# Patient Record
Sex: Male | Born: 1960 | Race: White | Hispanic: No | Marital: Married | Smoking: Former smoker
Health system: Southern US, Community
[De-identification: ages and names within clinical notes are randomized; demographics above are authoritative.]

## PROBLEM LIST (undated history)

## (undated) DIAGNOSIS — M25519 Pain in unspecified shoulder: Secondary | ICD-10-CM

## (undated) DIAGNOSIS — K4090 Unilateral inguinal hernia, without obstruction or gangrene, not specified as recurrent: Secondary | ICD-10-CM

## (undated) DIAGNOSIS — M549 Dorsalgia, unspecified: Secondary | ICD-10-CM

## (undated) DIAGNOSIS — D0339 Melanoma in situ of other parts of face: Secondary | ICD-10-CM

## (undated) DIAGNOSIS — F419 Anxiety disorder, unspecified: Secondary | ICD-10-CM

## (undated) DIAGNOSIS — F32A Depression, unspecified: Secondary | ICD-10-CM

## (undated) DIAGNOSIS — R609 Edema, unspecified: Secondary | ICD-10-CM

## (undated) DIAGNOSIS — M1990 Unspecified osteoarthritis, unspecified site: Secondary | ICD-10-CM

## (undated) DIAGNOSIS — T7840XA Allergy, unspecified, initial encounter: Secondary | ICD-10-CM

## (undated) DIAGNOSIS — M75100 Unspecified rotator cuff tear or rupture of unspecified shoulder, not specified as traumatic: Secondary | ICD-10-CM

## (undated) DIAGNOSIS — E785 Hyperlipidemia, unspecified: Secondary | ICD-10-CM

## (undated) DIAGNOSIS — J45909 Unspecified asthma, uncomplicated: Secondary | ICD-10-CM

## (undated) DIAGNOSIS — M25559 Pain in unspecified hip: Secondary | ICD-10-CM

## (undated) DIAGNOSIS — K219 Gastro-esophageal reflux disease without esophagitis: Secondary | ICD-10-CM

## (undated) DIAGNOSIS — C801 Malignant (primary) neoplasm, unspecified: Secondary | ICD-10-CM

## (undated) DIAGNOSIS — M67431 Ganglion, right wrist: Secondary | ICD-10-CM

## (undated) HISTORY — PX: HERNIA REPAIR: SHX51

## (undated) HISTORY — DX: Pain in unspecified shoulder: M25.519

## (undated) HISTORY — DX: Anxiety disorder, unspecified: F41.9

## (undated) HISTORY — DX: Dorsalgia, unspecified: M54.9

## (undated) HISTORY — DX: Hyperlipidemia, unspecified: E78.5

## (undated) HISTORY — DX: Unspecified osteoarthritis, unspecified site: M19.90

## (undated) HISTORY — DX: Unilateral inguinal hernia, without obstruction or gangrene, not specified as recurrent: K40.90

## (undated) HISTORY — DX: Allergy, unspecified, initial encounter: T78.40XA

## (undated) HISTORY — PX: TONSILLECTOMY AND ADENOIDECTOMY: SUR1326

## (undated) HISTORY — DX: Melanoma in situ of other parts of face: D03.39

## (undated) HISTORY — DX: Ganglion, right wrist: M67.431

## (undated) HISTORY — DX: Gastro-esophageal reflux disease without esophagitis: K21.9

## (undated) HISTORY — DX: Edema, unspecified: R60.9

## (undated) HISTORY — DX: Unspecified asthma, uncomplicated: J45.909

## (undated) HISTORY — DX: Pain in unspecified hip: M25.559

## (undated) HISTORY — DX: Malignant (primary) neoplasm, unspecified: C80.1

## (undated) HISTORY — DX: Unspecified rotator cuff tear or rupture of unspecified shoulder, not specified as traumatic: M75.100

## (undated) HISTORY — DX: Depression, unspecified: F32.A

## (undated) HISTORY — PX: POLYPECTOMY: SHX149

## (undated) HISTORY — PX: OTHER SURGICAL HISTORY: SHX169

---

## 1990-01-21 HISTORY — PX: ANKLE SURGERY: SHX546

## 2004-11-23 ENCOUNTER — Ambulatory Visit: Payer: Self-pay | Admitting: Family Medicine

## 2006-01-27 ENCOUNTER — Ambulatory Visit: Payer: Self-pay | Admitting: Family Medicine

## 2006-01-27 LAB — CONVERTED CEMR LAB
ALT: 39 units/L (ref 0–40)
AST: 27 units/L (ref 0–37)
Albumin: 4.3 g/dL (ref 3.5–5.2)
Alkaline Phosphatase: 73 units/L (ref 39–117)
BUN: 21 mg/dL (ref 6–23)
Basophils Absolute: 0.1 10*3/uL (ref 0.0–0.1)
Basophils Relative: 0.9 % (ref 0.0–1.0)
CO2: 30 meq/L (ref 19–32)
HCT: 43.6 % (ref 39.0–52.0)
Hemoglobin: 14.9 g/dL (ref 13.0–17.0)
LDL DIRECT: 170 mg/dL
Monocytes Relative: 8.9 % (ref 3.0–11.0)
Neutrophils Relative %: 44.1 % (ref 43.0–77.0)
Potassium: 4.3 meq/L (ref 3.5–5.1)
RDW: 12.4 % (ref 11.5–14.6)
Total Bilirubin: 0.6 mg/dL (ref 0.3–1.2)
Total Protein: 7.3 g/dL (ref 6.0–8.3)
WBC: 7.1 10*3/uL (ref 4.5–10.5)

## 2006-02-03 ENCOUNTER — Ambulatory Visit: Payer: Self-pay | Admitting: Family Medicine

## 2006-02-24 ENCOUNTER — Ambulatory Visit: Payer: Self-pay | Admitting: Gastroenterology

## 2006-02-25 ENCOUNTER — Ambulatory Visit: Payer: Self-pay | Admitting: Gastroenterology

## 2006-04-03 ENCOUNTER — Ambulatory Visit: Payer: Self-pay | Admitting: Family Medicine

## 2006-04-03 LAB — CONVERTED CEMR LAB
Albumin: 3.7 g/dL (ref 3.5–5.2)
Basophils Absolute: 0.1 10*3/uL (ref 0.0–0.1)
Cholesterol: 171 mg/dL (ref 0–200)
Eosinophils Absolute: 0.3 10*3/uL (ref 0.0–0.6)
GFR calc Af Amer: 104 mL/min
GFR calc non Af Amer: 86 mL/min
HCT: 39.9 % (ref 39.0–52.0)
HDL: 43 mg/dL (ref >39.0)
Hemoglobin: 14.1 g/dL (ref 13.0–17.0)
MCHC: 35.3 g/dL (ref 30.0–36.0)
MCV: 87.2 fL (ref 78.0–100.0)
Monocytes Absolute: 0.6 10*3/uL (ref 0.2–0.7)
Neutrophils Relative %: 50.3 % (ref 43.0–77.0)
Potassium: 4.7 meq/L (ref 3.5–5.1)
RBC: 4.58 M/uL (ref 4.22–5.81)
Sodium: 140 meq/L (ref 135–145)
TSH: 1.39 microintl units/mL (ref 0.35–5.50)
Total CHOL/HDL Ratio: 4

## 2006-04-25 ENCOUNTER — Ambulatory Visit: Payer: Self-pay | Admitting: Gastroenterology

## 2006-04-25 HISTORY — PX: ESOPHAGOGASTRODUODENOSCOPY: SHX1529

## 2006-09-12 DIAGNOSIS — J309 Allergic rhinitis, unspecified: Secondary | ICD-10-CM

## 2006-09-12 DIAGNOSIS — K219 Gastro-esophageal reflux disease without esophagitis: Secondary | ICD-10-CM

## 2007-01-29 DIAGNOSIS — J069 Acute upper respiratory infection, unspecified: Secondary | ICD-10-CM | POA: Insufficient documentation

## 2007-02-02 ENCOUNTER — Ambulatory Visit: Payer: Self-pay | Admitting: Family Medicine

## 2007-02-09 ENCOUNTER — Telehealth: Payer: Self-pay | Admitting: Family Medicine

## 2007-02-17 ENCOUNTER — Ambulatory Visit: Payer: Self-pay | Admitting: Family Medicine

## 2007-02-17 LAB — CONVERTED CEMR LAB
ALT: 28 units/L (ref 0–53)
Bilirubin, Direct: 0.2 mg/dL (ref 0.0–0.3)
Blood in Urine, dipstick: NEGATIVE
Calcium: 9.6 mg/dL (ref 8.4–10.5)
Eosinophils Absolute: 0.2 10*3/uL (ref 0.0–0.6)
Eosinophils Relative: 3.4 % (ref 0.0–5.0)
GFR calc Af Amer: 103 mL/min
GFR calc non Af Amer: 86 mL/min
Glucose, Bld: 88 mg/dL (ref 70–99)
Glucose, Urine, Semiquant: NEGATIVE
HDL: 31.2 mg/dL — ABNORMAL LOW (ref >39.0)
Ketones, urine, test strip: NEGATIVE
Lymphocytes Relative: 34.4 % (ref 12.0–46.0)
MCV: 88.4 fL (ref 78.0–100.0)
Monocytes Absolute: 0.7 10*3/uL (ref 0.2–0.7)
Neutro Abs: 3.6 10*3/uL (ref 1.4–7.7)
Platelets: 334 10*3/uL (ref 150–400)
Potassium: 4.8 meq/L (ref 3.5–5.1)
Sodium: 141 meq/L (ref 135–145)
Specific Gravity, Urine: >=1.03
TSH: 0.73 microintl units/mL (ref 0.35–5.50)
Triglycerides: 57 mg/dL (ref 0–149)
WBC Urine, dipstick: NEGATIVE
WBC: 6.9 10*3/uL (ref 4.5–10.5)
pH: 5.5

## 2007-02-27 ENCOUNTER — Ambulatory Visit: Payer: Self-pay | Admitting: Family Medicine

## 2007-02-27 DIAGNOSIS — E785 Hyperlipidemia, unspecified: Secondary | ICD-10-CM

## 2007-07-23 ENCOUNTER — Encounter: Admission: RE | Admit: 2007-07-23 | Discharge: 2007-07-23 | Payer: Self-pay | Admitting: Orthopedic Surgery

## 2007-08-17 ENCOUNTER — Telehealth: Payer: Self-pay | Admitting: Internal Medicine

## 2008-03-10 ENCOUNTER — Ambulatory Visit: Payer: Self-pay | Admitting: Family Medicine

## 2008-03-10 LAB — CONVERTED CEMR LAB
Albumin: 4.4 g/dL (ref 3.5–5.2)
Alkaline Phosphatase: 70 units/L (ref 39–117)
BUN: 17 mg/dL (ref 6–23)
Calcium: 9.4 mg/dL (ref 8.4–10.5)
Cholesterol: 202 mg/dL (ref 0–200)
Eosinophils Absolute: 0.2 10*3/uL (ref 0.0–0.7)
Eosinophils Relative: 2.8 % (ref 0.0–5.0)
GFR calc Af Amer: 103 mL/min
GFR calc non Af Amer: 85 mL/min
Glucose, Bld: 84 mg/dL (ref 70–99)
Glucose, Urine, Semiquant: NEGATIVE
HCT: 42.8 % (ref 39.0–52.0)
Hemoglobin: 14.9 g/dL (ref 13.0–17.0)
Ketones, urine, test strip: NEGATIVE
MCV: 89.1 fL (ref 78.0–100.0)
Monocytes Absolute: 0.7 10*3/uL (ref 0.1–1.0)
Neutro Abs: 3.7 10*3/uL (ref 1.4–7.7)
Platelets: 312 10*3/uL (ref 150–400)
Potassium: 4.3 meq/L (ref 3.5–5.1)
RDW: 12.2 % (ref 11.5–14.6)
Sodium: 140 meq/L (ref 135–145)
Specific Gravity, Urine: 1.025
TSH: 0.76 microintl units/mL (ref 0.35–5.50)
Total Protein: 7.2 g/dL (ref 6.0–8.3)
Triglycerides: 63 mg/dL (ref 0–149)

## 2008-03-15 ENCOUNTER — Ambulatory Visit: Payer: Self-pay | Admitting: Family Medicine

## 2008-07-28 ENCOUNTER — Ambulatory Visit: Payer: Self-pay | Admitting: Family Medicine

## 2008-08-22 ENCOUNTER — Telehealth: Payer: Self-pay | Admitting: Family Medicine

## 2008-08-24 ENCOUNTER — Telehealth: Payer: Self-pay | Admitting: Family Medicine

## 2009-03-17 ENCOUNTER — Ambulatory Visit: Payer: Self-pay | Admitting: Family Medicine

## 2009-03-17 LAB — CONVERTED CEMR LAB
ALT: 32 units/L (ref 0–53)
AST: 20 units/L (ref 0–37)
Albumin: 4.5 g/dL (ref 3.5–5.2)
BUN: 13 mg/dL (ref 6–23)
Basophils Absolute: 0 10*3/uL (ref 0.0–0.1)
Bilirubin Urine: NEGATIVE
CO2: 28 meq/L (ref 19–32)
Chloride: 109 meq/L (ref 96–112)
Cholesterol: 182 mg/dL (ref 0–200)
GFR calc non Af Amer: 95.39 mL/min (ref >60)
Glucose, Bld: 92 mg/dL (ref 70–99)
Glucose, Urine, Semiquant: NEGATIVE
HCT: 40.4 % (ref 39.0–52.0)
Hemoglobin: 13.7 g/dL (ref 13.0–17.0)
Lymphs Abs: 2.4 10*3/uL (ref 0.7–4.0)
MCHC: 34 g/dL (ref 30.0–36.0)
MCV: 91.3 fL (ref 78.0–100.0)
Monocytes Absolute: 0.5 10*3/uL (ref 0.1–1.0)
Monocytes Relative: 7.1 % (ref 3.0–12.0)
Neutro Abs: 3.3 10*3/uL (ref 1.4–7.7)
Platelets: 297 10*3/uL (ref 150.0–400.0)
Potassium: 4 meq/L (ref 3.5–5.1)
Protein, U semiquant: NEGATIVE
RDW: 12.3 % (ref 11.5–14.6)
Sodium: 141 meq/L (ref 135–145)
Specific Gravity, Urine: <1.005
TSH: 1.33 microintl units/mL (ref 0.35–5.50)
Total Bilirubin: 0.5 mg/dL (ref 0.3–1.2)
WBC Urine, dipstick: NEGATIVE
pH: 5.5

## 2009-04-28 ENCOUNTER — Ambulatory Visit: Payer: Self-pay | Admitting: Family Medicine

## 2009-11-03 ENCOUNTER — Ambulatory Visit: Payer: Self-pay | Admitting: Family Medicine

## 2009-12-11 ENCOUNTER — Ambulatory Visit: Payer: Self-pay | Admitting: Family Medicine

## 2009-12-19 ENCOUNTER — Telehealth: Payer: Self-pay | Admitting: Family Medicine

## 2010-02-20 NOTE — Progress Notes (Signed)
Summary: returned call  Phone Note Call from Patient   Summary of Call: patient is returning our call about an inhaler?  his number is [phone].  his pharmacy is [facility] on [address]. Initial call taken by: Kern Reap CMA Duncan Dull),  December 19, 2009 4:36 PM  Follow-up for Phone Call        Damaris dates that he is getting over his cold, but he feels like he is wheezing just a little bit.  In the past.  He said some albuterol, which helps him.  I called in the albuterol two puffs 3 to 4 times a day.  If after one week.  He is not any better then he needs to come to the office and he understands that Follow-up by: Roderick Pee MD,  December 19, 2009 5:39 PM    New/Updated Medications: PROVENTIL HFA 108 (90 BASE) MCG/ACT AERS (ALBUTEROL SULFATE) 2 ps qid as needed Prescriptions: PROVENTIL HFA 108 (90 BASE) MCG/ACT AERS (ALBUTEROL SULFATE) 2 ps qid as needed  #1 x 1   Entered and Authorized by:   Roderick Pee MD   Signed by:   Roderick Pee MD on 12/19/2009   Method used:   Electronically to        [facility]  [phone]* (retail)       [address]       [place], [place]  [address]       Ph: [phone] or [phone]       Fax: [phone]   RxID:   [id number]

## 2010-02-20 NOTE — Assessment & Plan Note (Signed)
Summary: COUGH, CONGESTION // RS   Vital Signs:  Patient profile:   50 year old male Weight:      213 pounds Temp:     98.8 degrees F oral BP sitting:   110 / 80  (left arm) Cuff size:   regular  Vitals Entered By: Kern Reap CMA Duncan Dull) (December 11, 2009 3:50 PM) CC: sinus congestion, cough   CC:  sinus congestion and cough.  History of Present Illness: Jerald is a 50 year old male, who comes in with a 4-day history of a congestion, sore throat, and cough.  Review of systems negative  Allergies: No Known Drug Allergies  Past History:  Past medical, surgical, family and social histories (including risk factors) reviewed for relevance to current acute and chronic problems.  Past Medical History: Reviewed history from 02/27/2007 and no changes required. Allergic rhinitis Ganglion right wrist GERD hyperlipidemia  Past Surgical History: Reviewed history from 09/12/2006 and no changes required. T&A Left knee scope EGD-04/25/2006 Colonoscopy-02/25/2006  Family History: Reviewed history from 02/27/2007 and no changes required. Father: Pulmonary Embolus Mother: .colon polyps Siblings: x 1 ok  Social History: Reviewed history from 02/27/2007 and no changes required. Married Current Smoker Alcohol use-yes Former Smoker Regular exercise-yes  Review of Systems      See HPI  Physical Exam  General:  Well-developed,well-nourished,in no acute distress; alert,appropriate and cooperative throughout examination Head:  Normocephalic and atraumatic without obvious abnormalities. No apparent alopecia or balding. Eyes:  No corneal or conjunctival inflammation noted. EOMI. Perrla. Funduscopic exam benign, without hemorrhages, exudates or papilledema. Vision grossly normal. Ears:  External ear exam shows no significant lesions or deformities.  Otoscopic examination reveals clear canals, tympanic membranes are intact bilaterally without bulging, retraction, inflammation or  discharge. Hearing is grossly normal bilaterally. Nose:  External nasal examination shows no deformity or inflammation. Nasal mucosa are pink and moist without lesions or exudates. Mouth:  Oral mucosa and oropharynx without lesions or exudates.  Teeth in good repair. Neck:  No deformities, masses, or tenderness noted. Chest Wall:  No deformities, masses, tenderness or gynecomastia noted. Lungs:  Normal respiratory effort, chest expands symmetrically. Lungs are clear to auscultation, no crackles or wheezes.   Impression & Recommendations:  Problem # 1:  VIRAL URI (ICD-465.9) Assessment Deteriorated  His updated medication list for this problem includes:    Adult Aspirin Low Strength 81 Mg Tbdp (Aspirin)    Zyrtec Allergy 10 Mg Tabs (Cetirizine hcl) .Marland Kitchen... As needed    Hydromet 5-1.5 Mg/16ml Syrp (Hydrocodone-homatropine) .Marland Kitchen... 1/2 to 1 tsp three times a day as needed  Complete Medication List: 1)  Adult Aspirin Low Strength 81 Mg Tbdp (Aspirin) 2)  Prilosec 20 Mg Cpdr (Omeprazole) .... Take 1 capsule by mouth once a day 3)  Acyclovir 200 Mg Caps (Acyclovir) .... As needed 4)  Zyrtec Allergy 10 Mg Tabs (Cetirizine hcl) .... As needed 5)  Flonase 50 Mcg/act Susp (Fluticasone propionate) .... Uad 6)  Simvastatin 20 Mg Tabs (Simvastatin) .Marland Kitchen.. 1 tab @ bedtime 7)  Hydromet 5-1.5 Mg/81ml Syrp (Hydrocodone-homatropine) .... 1/2 to 1 tsp three times a day as needed  Patient Instructions: 1)  drink 30 ounces of water daily, 2)  Saline nasal spray at bedtime okay to use the Afrin, but remember.  There is a 5 night limit 3)  Hydromet one half to 1 teaspoon up to 3 times a day as needed for sore throat, head congestion, and cough Prescriptions: HYDROMET 5-1.5 MG/5ML SYRP (HYDROCODONE-HOMATROPINE) 1/2 to 1 tsp three  times a day as needed  #8oz x 1   Entered and Authorized by:   Roderick Pee MD   Signed by:   Roderick Pee MD on 12/11/2009   Method used:   Print then Give to Patient   RxID:    [id number]    Orders Added: 1)  Est. Patient Level III [14782]

## 2010-02-20 NOTE — Assessment & Plan Note (Signed)
Summary: ST/LOW GRADE FEVER/NJR   Vital Signs:  Patient profile:   50 year old male Weight:      210 pounds Temp:     99 degrees F oral BP sitting:   120 / 64  Vitals Entered By: Lynann Beaver CMA (November 03, 2009 12:29 PM)  History of Present Illness: Connor Thomas is a 50 year old, married male, nonsmoker, who comes in with a 4-day history of head congestion, postnasal drip.  He has a history of allergic rhinitis and stop the Zyrtec and Flonase.  He typically very sensitive to mold.  Review of systems negative except his wife and son is 96 years old has similar symptoms  Current Medications (verified): 1)  Adult Aspirin Low Strength 81 Mg  Tbdp (Aspirin) 2)  Prilosec 20 Mg  Cpdr (Omeprazole) .... Take 1 Capsule By Mouth Once A Day 3)  Acyclovir 200 Mg  Caps (Acyclovir) .... As Needed 4)  Zyrtec Allergy 10 Mg  Tabs (Cetirizine Hcl) .... As Needed 5)  Flonase 50 Mcg/act  Susp (Fluticasone Propionate) .... Uad 6)  Simvastatin 20 Mg Tabs (Simvastatin) .Marland Kitchen.. 1 Tab @ Bedtime  Allergies (verified): No Known Drug Allergies  Past History:  Past medical, surgical, family and social histories (including risk factors) reviewed for relevance to current acute and chronic problems.  Past Medical History: Reviewed history from 02/27/2007 and no changes required. Allergic rhinitis Ganglion right wrist GERD hyperlipidemia  Past Surgical History: Reviewed history from 09/12/2006 and no changes required. T&A Left knee scope EGD-04/25/2006 Colonoscopy-02/25/2006  Family History: Reviewed history from 02/27/2007 and no changes required. Father: Pulmonary Embolus Mother: .colon polyps Siblings: x 1 ok  Social History: Reviewed history from 02/27/2007 and no changes required. Married Current Smoker Alcohol use-yes Former Smoker Regular exercise-yes  Review of Systems      See HPI  Physical Exam  General:  Well-developed,well-nourished,in no acute distress; alert,appropriate and  cooperative throughout examination Head:  Normocephalic and atraumatic without obvious abnormalities. No apparent alopecia or balding. Eyes:  No corneal or conjunctival inflammation noted. EOMI. Perrla. Funduscopic exam benign, without hemorrhages, exudates or papilledema. Vision grossly normal. Ears:  External ear exam shows no significant lesions or deformities.  Otoscopic examination reveals clear canals, tympanic membranes are intact bilaterally without bulging, retraction, inflammation or discharge. Hearing is grossly normal bilaterally. Nose:  septum in midline, 3+ nasal edema Mouth:  Oral mucosa and oropharynx without lesions or exudates.  Teeth in good repair.   Impression & Recommendations:  Problem # 1:  ALLERGIC RHINITIS (ICD-477.9) Assessment Deteriorated  His updated medication list for this problem includes:    Zyrtec Allergy 10 Mg Tabs (Cetirizine hcl) .Marland Kitchen... As needed    Flonase 50 Mcg/act Susp (Fluticasone propionate) ..... Uad  Complete Medication List: 1)  Adult Aspirin Low Strength 81 Mg Tbdp (Aspirin) 2)  Prilosec 20 Mg Cpdr (Omeprazole) .... Take 1 capsule by mouth once a day 3)  Acyclovir 200 Mg Caps (Acyclovir) .... As needed 4)  Zyrtec Allergy 10 Mg Tabs (Cetirizine hcl) .... As needed 5)  Flonase 50 Mcg/act Susp (Fluticasone propionate) .... Uad 6)  Simvastatin 20 Mg Tabs (Simvastatin) .Marland Kitchen.. 1 tab @ bedtime  Patient Instructions: 1)  restart the Zyrtec, 10 mg plain along with the steroid nasal spray. 2)  If you elect to use the Afrin remember.  There is a 5 night limit Prescriptions: FLONASE 50 MCG/ACT  SUSP (FLUTICASONE PROPIONATE) UAD  #3 x 4   Entered and Authorized by:   Roderick Pee MD  Signed by:   Roderick Pee MD on 11/03/2009   Method used:   Electronically to        [facility]  [phone]* (retail)       [address]       [place], [place]  [address]       Ph: [phone] or [phone]       Fax:  [phone]   RxID:   [id number]

## 2010-02-20 NOTE — Assessment & Plan Note (Signed)
Summary: cpx/cjr----PT [facility] // RS   Vital Signs:  Patient profile:   50 year old male Height:      67.25 inches Weight:      218 pounds BMI:     34.01 Temp:     99.0 degrees F oral BP sitting:   110 / 80  (left arm) Cuff size:   regular  Vitals Entered By: Kern Reap CMA Duncan Dull) (April 28, 2009 3:48 PM) CC: cpx Is Patient Diabetic? No Pain Assessment Patient in pain? no        CC:  cpx.  History of Present Illness: Connor Thomas is a 50 year old, married male......... nonsmoker x 6 years............ who comes in today for physical evaluation.  He has a history of underlying reflux esophagitis, for which he takes Prilosec 20 mg daily.  He said, upper and lower endoscopy.  He also takes acyclovir as needed for outbreak at h.s.v  He also uses Zyrtec and Flonase nasal spray for allergic rhinitis.  He takes simvastatin 20 mg nightly for hyperlipidemia.  Lipids are normal  He gets routine eye care and dental care.  Colonoscopy as noted above.  2008 normal, tetanus booster 2002  Allergies (verified): No Known Drug Allergies  Past History:  Past medical, surgical, family and social histories (including risk factors) reviewed, and no changes noted (except as noted below).  Past Medical History: Reviewed history from 02/27/2007 and no changes required. Allergic rhinitis Ganglion right wrist GERD hyperlipidemia  Past Surgical History: Reviewed history from 09/12/2006 and no changes required. T&A Left knee scope EGD-04/25/2006 Colonoscopy-02/25/2006  Family History: Reviewed history from 02/27/2007 and no changes required. Father: Pulmonary Embolus Mother: .colon polyps Siblings: x 1 ok  Social History: Reviewed history from 02/27/2007 and no changes required. Married Current Smoker Alcohol use-yes Former Smoker Regular exercise-yes  Review of Systems      See HPI  Physical Exam  General:  Well-developed,well-nourished,in no acute distress; alert,appropriate and  cooperative throughout examination Head:  Normocephalic and atraumatic without obvious abnormalities. No apparent alopecia or balding. Eyes:  No corneal or conjunctival inflammation noted. EOMI. Perrla. Funduscopic exam benign, without hemorrhages, exudates or papilledema. Vision grossly normal. Ears:  External ear exam shows no significant lesions or deformities.  Otoscopic examination reveals clear canals, tympanic membranes are intact bilaterally without bulging, retraction, inflammation or discharge. Hearing is grossly normal bilaterally. Nose:  External nasal examination shows no deformity or inflammation. Nasal mucosa are pink and moist without lesions or exudates. Mouth:  Oral mucosa and oropharynx without lesions or exudates.  Teeth in good repair. Neck:  No deformities, masses, or tenderness noted. Chest Wall:  No deformities, masses, tenderness or gynecomastia noted. Breasts:  No masses or gynecomastia noted Lungs:  Normal respiratory effort, chest expands symmetrically. Lungs are clear to auscultation, no crackles or wheezes. Heart:  Normal rate and regular rhythm. S1 and S2 normal without gallop, murmur, click, rub or other extra sounds. Abdomen:  Bowel sounds positive,abdomen soft and non-tender without masses, organomegaly or hernias noted. Rectal:  No external abnormalities noted. Normal sphincter tone. No rectal masses or tenderness. Genitalia:  Testes bilaterally descended without nodularity, tenderness or masses. No scrotal masses or lesions. No penis lesions or urethral discharge. Prostate:  Prostate gland firm and smooth, no enlargement, nodularity, tenderness, mass, asymmetry or induration. Msk:  No deformity or scoliosis noted of thoracic or lumbar spine.   Pulses:  R and L carotid,radial,femoral,dorsalis pedis and posterior tibial pulses are full and equal bilaterally Extremities:  No clubbing, cyanosis,  edema, or deformity noted with normal full range of motion of all joints.    Neurologic:  No cranial nerve deficits noted. Station and gait are normal. Plantar reflexes are down-going bilaterally. DTRs are symmetrical throughout. Sensory, motor and coordinative functions appear intact. Skin:  Intact without suspicious lesions or rashes Cervical Nodes:  No lymphadenopathy noted Axillary Nodes:  No palpable lymphadenopathy Inguinal Nodes:  No significant adenopathy Psych:  Cognition and judgment appear intact. Alert and cooperative with normal attention span and concentration. No apparent delusions, illusions, hallucinations   Impression & Recommendations:  Problem # 1:  HYPERLIPIDEMIA (ICD-272.4) Assessment Improved  His updated medication list for this problem includes:    Simvastatin 20 Mg Tabs (Simvastatin) .Marland Kitchen... 1 tab @ bedtime  Orders: EKG w/ Interpretation (93000)  Problem # 2:  PHYSICAL EXAMINATION (ICD-V70.0) Assessment: Unchanged  Orders: EKG w/ Interpretation (93000)  Problem # 3:  GERD (ICD-530.81) Assessment: Improved  His updated medication list for this problem includes:    Prilosec 20 Mg Cpdr (Omeprazole) .Marland Kitchen... Take 1 capsule by mouth once a day  Complete Medication List: 1)  Adult Aspirin Low Strength 81 Mg Tbdp (Aspirin) 2)  Prilosec 20 Mg Cpdr (Omeprazole) .... Take 1 capsule by mouth once a day 3)  Acyclovir 200 Mg Caps (Acyclovir) .... As needed 4)  Zyrtec Allergy 10 Mg Tabs (Cetirizine hcl) .... As needed 5)  Flonase 50 Mcg/act Susp (Fluticasone propionate) .... Uad 6)  Simvastatin 20 Mg Tabs (Simvastatin) .Marland Kitchen.. 1 tab @ bedtime  Patient Instructions: 1)  Please schedule a follow-up appointment in 1 year. 2)  It is important that you exercise regularly at least 20 minutes 5 times a week. If you develop chest pain, have severe difficulty breathing, or feel very tired , stop exercising immediately and seek medical attention. 3)  Take an Aspirin every day. Prescriptions: SIMVASTATIN 20 MG TABS (SIMVASTATIN) 1 tab @ bedtime  #100 x  3   Entered and Authorized by:   Roderick Pee MD   Signed by:   Roderick Pee MD on 04/28/2009   Method used:   Print then Give to Patient   RxID:   [id number] FLONASE 50 MCG/ACT  SUSP (FLUTICASONE PROPIONATE) UAD  #3 x 4   Entered and Authorized by:   Roderick Pee MD   Signed by:   Roderick Pee MD on 04/28/2009   Method used:   Print then Give to Patient   RxID:   [id number] ACYCLOVIR 200 MG  CAPS (ACYCLOVIR) as needed  #100 x 4   Entered and Authorized by:   Roderick Pee MD   Signed by:   Roderick Pee MD on 04/28/2009   Method used:   Print then Give to Patient   RxID:   [id number] PRILOSEC 20 MG  CPDR (OMEPRAZOLE) Take 1 capsule by mouth once a day  #100 x 3   Entered and Authorized by:   Roderick Pee MD   Signed by:   Roderick Pee MD on 04/28/2009   Method used:   Print then Give to Patient   RxID:   [id number]

## 2010-04-23 ENCOUNTER — Other Ambulatory Visit (INDEPENDENT_AMBULATORY_CARE_PROVIDER_SITE_OTHER): Payer: Self-pay | Admitting: Family Medicine

## 2010-04-23 DIAGNOSIS — Z0000 Encounter for general adult medical examination without abnormal findings: Secondary | ICD-10-CM

## 2010-04-23 DIAGNOSIS — Z0289 Encounter for other administrative examinations: Secondary | ICD-10-CM

## 2010-04-23 LAB — POCT URINALYSIS DIPSTICK
Blood, UA: NEGATIVE
Glucose, UA: NEGATIVE
Leukocytes, UA: NEGATIVE
Nitrite, UA: NEGATIVE
Urobilinogen, UA: 0.2

## 2010-04-23 LAB — HEPATIC FUNCTION PANEL
ALT: 27 U/L (ref 0–53)
AST: 18 U/L (ref 0–37)
Alkaline Phosphatase: 63 U/L (ref 39–117)
Bilirubin, Direct: 0.1 mg/dL (ref 0.0–0.3)
Total Protein: 6.8 g/dL (ref 6.0–8.3)

## 2010-04-23 LAB — CBC WITH DIFFERENTIAL/PLATELET
Basophils Relative: 0.8 % (ref 0.0–3.0)
Eosinophils Relative: 5.1 % — ABNORMAL HIGH (ref 0.0–5.0)
Hemoglobin: 14.1 g/dL (ref 13.0–17.0)
Lymphocytes Relative: 36.3 % (ref 12.0–46.0)
Neutro Abs: 3.4 10*3/uL (ref 1.4–7.7)
Neutrophils Relative %: 49.7 % (ref 43.0–77.0)
RBC: 4.54 Mil/uL (ref 4.22–5.81)
WBC: 6.9 10*3/uL (ref 4.5–10.5)

## 2010-04-23 LAB — LIPID PANEL: Total CHOL/HDL Ratio: 3

## 2010-04-23 LAB — BASIC METABOLIC PANEL
Calcium: 9 mg/dL (ref 8.4–10.5)
Chloride: 98 mEq/L (ref 96–112)
Creatinine, Ser: 0.8 mg/dL (ref 0.4–1.5)
Sodium: 135 mEq/L (ref 135–145)

## 2010-04-23 LAB — TSH: TSH: 1.35 u[IU]/mL (ref 0.35–5.50)

## 2010-04-30 ENCOUNTER — Encounter: Payer: Self-pay | Admitting: Family Medicine

## 2010-05-01 ENCOUNTER — Ambulatory Visit (INDEPENDENT_AMBULATORY_CARE_PROVIDER_SITE_OTHER): Payer: 59 | Admitting: Family Medicine

## 2010-05-01 ENCOUNTER — Encounter: Payer: Self-pay | Admitting: Family Medicine

## 2010-05-01 DIAGNOSIS — Z136 Encounter for screening for cardiovascular disorders: Secondary | ICD-10-CM

## 2010-05-01 DIAGNOSIS — E785 Hyperlipidemia, unspecified: Secondary | ICD-10-CM

## 2010-05-01 DIAGNOSIS — J309 Allergic rhinitis, unspecified: Secondary | ICD-10-CM

## 2010-05-01 DIAGNOSIS — Z0000 Encounter for general adult medical examination without abnormal findings: Secondary | ICD-10-CM

## 2010-05-01 DIAGNOSIS — Z23 Encounter for immunization: Secondary | ICD-10-CM

## 2010-05-01 DIAGNOSIS — B009 Herpesviral infection, unspecified: Secondary | ICD-10-CM | POA: Insufficient documentation

## 2010-05-01 DIAGNOSIS — K219 Gastro-esophageal reflux disease without esophagitis: Secondary | ICD-10-CM

## 2010-05-01 MED ORDER — ACYCLOVIR 200 MG PO CAPS: ORAL_CAPSULE | ORAL | Status: DC

## 2010-05-01 MED ORDER — ALBUTEROL SULFATE HFA 108 (90 BASE) MCG/ACT IN AERS: 1.0000 | INHALATION_SPRAY | Freq: Four times a day (QID) | RESPIRATORY_TRACT | Status: DC

## 2010-05-01 MED ORDER — SIMVASTATIN 20 MG PO TABS: 20.0000 mg | ORAL_TABLET | Freq: Every day | ORAL | Status: DC

## 2010-05-01 MED ORDER — OMEPRAZOLE 20 MG PO CPDR: 20.0000 mg | DELAYED_RELEASE_CAPSULE | Freq: Every day | ORAL | Status: DC

## 2010-05-01 MED ORDER — FLUTICASONE PROPIONATE 50 MCG/ACT NA SUSP: 2.0000 | Freq: Every day | NASAL | Status: DC

## 2010-05-01 NOTE — Progress Notes (Signed)
  Subjective:    Patient ID: Connor Thomas, male    DOB: 05-27-1960, 50 y.o.   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  He starts in excellent health.  He said no chronic cough from 6 for the above.  He just routine eye care, dental care, tetanus booster today.    Review of Systems  Constitutional: Negative.   HENT: Negative.   Eyes: Negative.   Respiratory: Negative.   Cardiovascular: Negative.   Gastrointestinal: Negative.   Genitourinary: Negative.   Musculoskeletal: Negative.   Skin: Negative.   Neurological: Negative.   Hematological: Negative.   Psychiatric/Behavioral: Negative.        Objective:   Physical Exam  Constitutional: He is oriented to person, place, and time. He appears well-developed and well-nourished.  HENT:  Head: Normocephalic and atraumatic.  Right Ear: External ear normal.  Left Ear: External ear normal.  Nose: Nose normal.  Mouth/Throat: Oropharynx is clear and moist.  Eyes: Conjunctivae and EOM are normal. Pupils are equal, round, and reactive to light.  Neck: Normal range of motion. Neck supple. No JVD present. No tracheal deviation present. No thyromegaly present.  Cardiovascular: Normal rate, regular rhythm, normal heart sounds and intact distal pulses.  Exam reveals no gallop and no friction rub.   No murmur heard. Pulmonary/Chest: Effort normal and breath sounds normal. No stridor. No respiratory distress. He has no wheezes. He has no rales. He exhibits no tenderness.  Abdominal: Soft. Bowel sounds are normal. He exhibits no distension and no mass. There is no tenderness. There is no rebound and no guarding.    Genitourinary: Rectum normal, prostate normal and penis normal. Guaiac negative stool. No penile tenderness.  Musculoskeletal: Normal range of motion. He exhibits no edema and no tenderness.  Lymphadenopathy:    He has no cervical adenopathy.  Neurological: He is alert and oriented to person, place, and time. He has normal reflexes. No cranial nerve deficit. He exhibits normal muscle tone.  Skin: Skin is warm and dry. No rash noted. No erythema. No pallor.  Psychiatric: He has a normal mood and affect. His behavior is normal. Judgment and thought content normal.          Assessment & Plan:  Hyperlipidemia continue Zocor.  Reflux esophagitis.  Continue Prilosec.  Allergic rhinitis.  Continue Zyrtec and Flonase and albuterol p.r.n.  History of HSV oral continue acyclovir p.r.n.

## 2010-05-01 NOTE — Patient Instructions (Signed)
Continue your current medications.  Follow-up in one year or sooner if any problems 

## 2010-06-08 NOTE — Assessment & Plan Note (Signed)
[facility]                         GASTROENTEROLOGY OFFICE NOTE   Connor Thomas, Connor Thomas                    MRN:          [id number]  DATE:02/24/2006                            DOB:          1960/06/26    REFERRING PHYSICIAN:  Tinnie Gens A. Tawanna Cooler, MD   REASON FOR REFERRAL:  Dr. Tawanna Cooler asked me to evaluate Connor Thomas in  consultation regarding chronic gastroesophageal reflux disease symptoms,  chronic dyspepsia, family history for colon polyps.   HISTORY OF PRESENT ILLNESS:  Connor Thomas is a very pleasant 50 year old man  who has had 15-20 years of dyspeptic symptoms.  He describes pyrosis  intermittently,  belching periodically.  Intermittent abdominal  discomfort.  He had recent lab tests last month showing a normal CBC and  a normal complete metabolic profile.  His internist put him on a trial  of Prilosec, which he has been taking 20-30 minutes prior to his  breakfast meal.  He says on this regiment he is completely better.  He  has no bothersome upper GI symptoms while on this.   He also has a family history for colon polyps.  He himself has never had  constipation, bleeding, diarrhea.  Mother with colon polyps.  Grandmother with colon cancer.   REVIEW OF SYSTEMS:  Notable for a 35 pound weight gain in the past year  to 2.  No overt GI bleeding.  The rest of his review of systems is  essentially normal and is available on his nursing intake sheet.   PAST MEDICAL HISTORY:  COPD, elevated cholesterol, mild depression, left  ankle surgery.   CURRENT MEDICATIONS:  1. Prilosec 20 mg once daily.  2. Zocor.  3. Aspirin.  4. Advil.  5. Multivitamin.  6. Tums.   ALLERGIES:  No known drug allergies.   SOCIAL HISTORY:  Married with one son.  Works as a Transport planner.  Nonsmoker. Drinks alcohol infrequently.   PHYSICAL EXAMINATION:  5 foot 8 inches, 224 pounds, blood pressure  102/64, pulse 60.  CONSTITUTIONAL:  Mildly obese, otherwise, well  appearing.  NEUROLOGICAL:  Alert and orientated x3.  EYES:  Extraocular movements intact.  MOUTH:  Oropharynx moist.  No lesions.  NECK:  Supple.  No lymphadenopathy.  CARDIOVASCULAR:  Regular rate and rhythm.  LUNGS:  Clear to auscultation bilaterally.  ABDOMEN:  Soft, nontender, nondistended. Normal bowel sounds.  EXTREMITIES:  No lower extremity edema.  SKIN:  No rashes or lesions on visible extremities.   ASSESSMENT/PLAN:  A 50 year old man with chronic gastroesophageal reflux  disease and dyspeptic symptoms, all very improved on once daily proton  pump inhibitor, family history for colon polyps.   First his upper gastrointestinal symptoms have all very much improved  since starting Prilosec once daily.  He will continue that for now, but  I think it is best to screen him for Barrett's since he has had symptoms  for 10-20 years.  At the same time we will proceed with a full  colonoscopy given his family history for colon polyps.  I have also  discussed certain other foods that tend to exacerbate  gastrointestinal  reflux disease problems such as caffeine, alcohol, cigarettes, chocolate  and peppermint.  I see no reason for any further blood tests or imaging  studies prior to his procedures, which we will have scheduled at his  soonest convenience.     Rachael Fee, MD  Electronically Signed    DPJ/MedQ  DD: 02/24/2006  DT: 02/24/2006  Job #: [id number]   cc:   Tinnie Gens A. Tawanna Cooler, MD

## 2011-05-06 ENCOUNTER — Other Ambulatory Visit: Payer: 59

## 2011-05-13 ENCOUNTER — Encounter: Payer: 59 | Admitting: Family Medicine

## 2011-07-09 ENCOUNTER — Other Ambulatory Visit (INDEPENDENT_AMBULATORY_CARE_PROVIDER_SITE_OTHER): Payer: 59

## 2011-07-09 DIAGNOSIS — Z0000 Encounter for general adult medical examination without abnormal findings: Secondary | ICD-10-CM

## 2011-07-09 LAB — LIPID PANEL
Cholesterol: 178 mg/dL (ref 0–200)
VLDL: 25.4 mg/dL (ref 0.0–40.0)

## 2011-07-09 LAB — CBC WITH DIFFERENTIAL/PLATELET
Basophils Absolute: 0 10*3/uL (ref 0.0–0.1)
Eosinophils Absolute: 0.2 10*3/uL (ref 0.0–0.7)
Lymphocytes Relative: 37.8 % (ref 12.0–46.0)
MCHC: 33.5 g/dL (ref 30.0–36.0)
Neutrophils Relative %: 47.3 % (ref 43.0–77.0)
RBC: 4.6 Mil/uL (ref 4.22–5.81)
RDW: 12.9 % (ref 11.5–14.6)

## 2011-07-09 LAB — TSH: TSH: 1.07 u[IU]/mL (ref 0.35–5.50)

## 2011-07-09 LAB — POCT URINALYSIS DIPSTICK
Leukocytes, UA: NEGATIVE
Nitrite, UA: NEGATIVE
Protein, UA: NEGATIVE
pH, UA: 5.5

## 2011-07-09 LAB — HEPATIC FUNCTION PANEL
ALT: 25 U/L (ref 0–53)
Bilirubin, Direct: 0.1 mg/dL (ref 0.0–0.3)
Total Protein: 6.9 g/dL (ref 6.0–8.3)

## 2011-07-09 LAB — BASIC METABOLIC PANEL
BUN: 15 mg/dL (ref 6–23)
GFR: 79.98 mL/min (ref >60.00)
Potassium: 4.2 mEq/L (ref 3.5–5.1)
Sodium: 140 mEq/L (ref 135–145)

## 2011-07-09 LAB — PSA: PSA: 0.6 ng/mL (ref 0.10–4.00)

## 2011-07-16 ENCOUNTER — Encounter: Payer: Self-pay | Admitting: Family Medicine

## 2011-07-16 ENCOUNTER — Ambulatory Visit (INDEPENDENT_AMBULATORY_CARE_PROVIDER_SITE_OTHER): Payer: 59 | Admitting: Family Medicine

## 2011-07-16 VITALS — BP 108/78 | Temp 98.1°F | Ht 68.0 in | Wt 218.0 lb

## 2011-07-16 DIAGNOSIS — E663 Overweight: Secondary | ICD-10-CM

## 2011-07-16 DIAGNOSIS — E785 Hyperlipidemia, unspecified: Secondary | ICD-10-CM

## 2011-07-16 DIAGNOSIS — K219 Gastro-esophageal reflux disease without esophagitis: Secondary | ICD-10-CM

## 2011-07-16 DIAGNOSIS — B009 Herpesviral infection, unspecified: Secondary | ICD-10-CM

## 2011-07-16 DIAGNOSIS — J309 Allergic rhinitis, unspecified: Secondary | ICD-10-CM

## 2011-07-16 MED ORDER — SIMVASTATIN 20 MG PO TABS: 20.0000 mg | ORAL_TABLET | Freq: Every day | ORAL | Status: DC

## 2011-07-16 MED ORDER — OMEPRAZOLE 20 MG PO CPDR: 20.0000 mg | DELAYED_RELEASE_CAPSULE | Freq: Every day | ORAL | Status: DC

## 2011-07-16 MED ORDER — ALBUTEROL SULFATE HFA 108 (90 BASE) MCG/ACT IN AERS: 1.0000 | INHALATION_SPRAY | Freq: Four times a day (QID) | RESPIRATORY_TRACT | Status: DC

## 2011-07-16 MED ORDER — ACYCLOVIR 200 MG PO CAPS: ORAL_CAPSULE | ORAL | Status: DC

## 2011-07-16 MED ORDER — FLUTICASONE PROPIONATE 50 MCG/ACT NA SUSP: 2.0000 | Freq: Every day | NASAL | Status: DC

## 2011-07-16 NOTE — Patient Instructions (Signed)
Continue your current medications  Followup in 1 year sooner if any problems  We will put in a request for a nutrition consult

## 2011-07-16 NOTE — Progress Notes (Signed)
  Subjective:    Patient ID: Connor Thomas, male    DOB: Feb 04, 1960, 51 y.o.   MRN: [id number]  HPI Connor Thomas is a 51 year old married male nonsmoker who comes in today for general physical examination  He takes Zocor 20 mg daily with an aspirin tablet for hyperlipidemia  He takes Prilosec 20 mg daily for history of chronic reflux esophagitis.  He takes acyclovir when necessary albuterol when necessary Zyrtec and steroid nasal spray when necessary  He gets routine eye care, dental care, colonoscopy and GI normal tetanus 2012  His weight is 218 pounds he 68 inches tall he is requesting to see a nutritionist    Review of Systems  Constitutional: Negative.   HENT: Negative.   Eyes: Negative.   Respiratory: Negative.   Cardiovascular: Negative.   Gastrointestinal: Negative.   Genitourinary: Negative.   Musculoskeletal: Negative.   Skin: Negative.   Neurological: Negative.   Hematological: Negative.   Psychiatric/Behavioral: Negative.        Objective:   Physical Exam  Constitutional: He is oriented to person, place, and time. He appears well-developed and well-nourished.  HENT:  Head: Normocephalic and atraumatic.  Right Ear: External ear normal.  Left Ear: External ear normal.  Nose: Nose normal.  Mouth/Throat: Oropharynx is clear and moist.  Eyes: Conjunctivae and EOM are normal. Pupils are equal, round, and reactive to light.  Neck: Normal range of motion. Neck supple. No JVD present. No tracheal deviation present. No thyromegaly present.  Cardiovascular: Normal rate, regular rhythm, normal heart sounds and intact distal pulses.  Exam reveals no gallop and no friction rub.   No murmur heard. Pulmonary/Chest: Effort normal and breath sounds normal. No stridor. No respiratory distress. He has no wheezes. He has no rales. He exhibits no tenderness.  Abdominal: Soft. Bowel sounds are normal. He exhibits no distension and no mass. There is no tenderness. There is no rebound  and no guarding.  Genitourinary: Rectum normal, prostate normal and penis normal. Guaiac negative stool. No penile tenderness.  Musculoskeletal: Normal range of motion. He exhibits no edema and no tenderness.  Lymphadenopathy:    He has no cervical adenopathy.  Neurological: He is alert and oriented to person, place, and time. He has normal reflexes. No cranial nerve deficit. He exhibits normal muscle tone.  Skin: Skin is warm and dry. No rash noted. No erythema. No pallor.  Psychiatric: He has a normal mood and affect. His behavior is normal. Judgment and thought content normal.          Assessment & Plan:  Healthy male  Hyperlipidemia continue Zocor and aspirin  History of chronic reflux continue Prilosec 20 mg daily  Allergic rhinitis continue Zyrtec and steroid nasal spray when necessary  Occasional asthma albuterol when necessary  History of occasional herpetic infection acyclovir when necessary  Concerned about weight nutrition consult

## 2011-08-12 ENCOUNTER — Encounter: Payer: 59 | Attending: Family Medicine | Admitting: *Deleted

## 2011-08-12 ENCOUNTER — Encounter: Payer: Self-pay | Admitting: *Deleted

## 2011-08-12 VITALS — Ht 68.0 in | Wt 214.1 lb

## 2011-08-12 DIAGNOSIS — E669 Obesity, unspecified: Secondary | ICD-10-CM | POA: Insufficient documentation

## 2011-08-12 DIAGNOSIS — Z713 Dietary counseling and surveillance: Secondary | ICD-10-CM | POA: Insufficient documentation

## 2011-08-12 NOTE — Progress Notes (Signed)
Medical Nutrition Therapy:  Appt start time: 1030 end time:  1130.  Assessment:  Primary concerns today: Obesity. Patient desires nutrition counseling to help with weight loss. He has tried Weight Watchers 6 months ago and lost 14 pounds. However, he has since stopped the program. His weight has been stable. He wants to lose weight with a goal of 180-185 pounds to "prevent heart disease." He has some baseline knowledge of healthy eating, but would like additional guidance with meal planning. He travels for work twice a month and eats out 2-5 times per week. His wife does most of the cooking. He also has elevated LDL cholesterol (104). He is not consistently physically active, but would like to walk more, gradually increasing intensity.   WEIGHT: 214.1 pounds BMI: 32.6  MEDICATIONS: Zocor, prilosec   DIETARY INTAKE:   Usual eating pattern includes 2-3 meals and 2 snacks per day.  24-hr recall:  B ( AM): Black coffee, usually skips, eggs, sausage, biscuits on weekends  Snk ( AM): None  L ( PM): [place] wrap with lettuce, tomato, and cheese, almonds, chips, water Snk ( PM): Sometimes, rice crispies, chips, slim jim, granola bars D ( PM): Grilled/stir fry chicken/fish/burgers, salad/vegetable Snk ( PM): Popcorn, almonds Beverages: Diet Coke, water, coffee  Usual physical activity: No consistent activity, has a gym membership, plays tennis occationally  Estimated energy needs: 1600 calories 200 g carbohydrates 100 g protein 44 g fat  Progress Towards Goal(s):  In progress.   Nutritional Diagnosis:  [place]-3.3 Overweight/obesity As related to history of excessive energy intake and physical inactivity.  As evidenced by BMI 32.6.    Intervention:  Nutrition counseling. Discussed with the patient the basic concepts of weight loss (calories in < calories out). We discussed healthy food choices, portion control, and meal planning. We also discussed the benefits of regular physical activity and types  of activity to do.   Goals: 1. Eat 3 regular meals plus 1-2 planned snacks daily.  2. Monitor portion size and reduce meat and increase vegetable portions at meals.  3. Read the food label to compare calories in foods.  4. Walk at least 3 days a week for 30 minutes, increase frequency and/or duration of exercise as tolerated to a minimum of 150 minutes of moderate activity daily.   Handouts given during visit include:  5 day 1500 calorie meal plan  Weight loss tips handout  Yellow portion card  Monitoring/Evaluation:  Dietary intake, exercise, and body weight in 1 month(s).

## 2011-08-12 NOTE — Patient Instructions (Signed)
Goals: 1. Eat 3 regular meals plus 1-2 planned snacks daily.  2. Monitor portion size and reduce meat and increase vegetable portions at meals.  3. Read the food label to compare calories in foods.  4. Walk at least 3 days a week for 30 minutes, increase frequency and/or duration of exercise as tolerated to a minimum of 150 minutes of moderate activity daily.

## 2012-02-24 ENCOUNTER — Encounter: Payer: Self-pay | Admitting: Family Medicine

## 2012-02-24 ENCOUNTER — Ambulatory Visit (INDEPENDENT_AMBULATORY_CARE_PROVIDER_SITE_OTHER): Payer: 59 | Admitting: Family Medicine

## 2012-02-24 VITALS — BP 120/80 | Temp 99.0°F | Wt 227.0 lb

## 2012-02-24 DIAGNOSIS — H6120 Impacted cerumen, unspecified ear: Secondary | ICD-10-CM

## 2012-02-24 NOTE — Progress Notes (Signed)
  Subjective:    Patient ID: Connor Thomas, male    DOB: 1960/03/25, 52 y.o.   MRN: [id number]  HPI This is a 52 year old male who comes in today for evaluation of decreased hearing in right ear. He uses Q-tips to clean his ears   Review of Systems Review of systems negative    Objective:   Physical Exam  Well-developed well-nourished male in no acute distress cerumen impaction right ear relieved by your deviation by [place]      Assessment & Plan:  Ear wax right ear removed by irrigation

## 2012-02-24 NOTE — Patient Instructions (Signed)
Return when necessary  Stop using Q-tips

## 2012-03-13 ENCOUNTER — Ambulatory Visit (INDEPENDENT_AMBULATORY_CARE_PROVIDER_SITE_OTHER): Payer: 59 | Admitting: Surgery

## 2012-03-13 ENCOUNTER — Encounter (INDEPENDENT_AMBULATORY_CARE_PROVIDER_SITE_OTHER): Payer: Self-pay | Admitting: Surgery

## 2012-03-13 VITALS — BP 112/70 | HR 72 | Resp 18 | Ht 68.0 in | Wt 222.0 lb

## 2012-03-13 DIAGNOSIS — K429 Umbilical hernia without obstruction or gangrene: Secondary | ICD-10-CM

## 2012-03-13 NOTE — Progress Notes (Signed)
Chief Complaint:  Chronic umbilical hernia it is intermittently tender  History of Present Illness:  Connor Thomas is an 51 y.o. male Who has had an umbilical hernia for a number of years but recently it has increased in size and become intermittently tender. It would appear that this contains only preperitoneal fat at the present.  Past Medical History  Diagnosis Date  . Allergy   . Ganglion of right wrist   . GERD (gastroesophageal reflux disease)   . Hyperlipidemia     Past Surgical History  Procedure Laterality Date  . Tonsillectomy and adenoidectomy    . Left knee scope    . Esophagogastroduodenoscopy  04/25/06    Current Outpatient Prescriptions  Medication Sig Dispense Refill  . albuterol (PROVENTIL HFA) 108 (90 BASE) MCG/ACT inhaler Inhale 1 puff into the lungs 4 (four) times daily. 2 ps quid as needed  18 g  2  . aspirin 81 MG tablet Take 81 mg by mouth daily.        . cetirizine (ZYRTEC) 10 MG tablet Take 10 mg by mouth daily.        . fluticasone (FLONASE) 50 MCG/ACT nasal spray Place 2 sprays into the nose daily.  16 g  11  . omeprazole (PRILOSEC) 20 MG capsule Take 1 capsule (20 mg total) by mouth daily.  100 capsule  3  . simvastatin (ZOCOR) 20 MG tablet Take 1 tablet (20 mg total) by mouth at bedtime.  100 tablet  3   No current facility-administered medications for this visit.   Review of patient's allergies indicates not on file. Family History  Problem Relation Age of Onset  .      Social History:   reports that he quit smoking about 6 years ago. His smoking use included Cigarettes. He smoked 0.00 packs per day. He does not have any smokeless tobacco history on file. He reports that  drinks alcohol. He reports that he does not use illicit drugs.   REVIEW OF SYSTEMS - PERTINENT POSITIVES ONLY: Noncontributory  Physical Exam:   Blood pressure 112/70, pulse 72, resp. rate 18, height 5' 8" (1.727 m), weight 222 lb (100.699 kg). Body mass index is 33.76  kg/(m^2).  Gen:  WDWN WM NAD  Neurological: Alert and oriented to person, place, and time. Motor and sensory function is grossly intact  Head: Normocephalic and atraumatic.  Eyes: Conjunctivae are normal. Pupils are equal, round, and reactive to light. No scleral icterus.  Neck: Normal range of motion. Neck supple. No tracheal deviation or thyromegaly present.  Cardiovascular:  SR without murmurs or gallops.  No carotid bruits Respiratory: Effort normal.  No respiratory distress. No chest wall tenderness. Breath sounds normal.  No wheezes, rales or rhonchi.  Abdomen:  Prominent soft umbilical hernia reducible GU: Musculoskeletal: Normal range of motion. Extremities are nontender. No cyanosis, edema or clubbing noted Lymphadenopathy: No cervical, preauricular, postauricular or axillary adenopathy is present Skin: Skin is warm and dry. No rash noted. No diaphoresis. No erythema. No pallor. Pscyh: Normal mood and affect. Behavior is normal. Judgment and thought content normal.   LABORATORY RESULTS: No results found for this or any previous visit (from the past 48 hour(s)).  RADIOLOGY RESULTS: No results found.  Problem List: Patient Active Problem List  Diagnosis  . HYPERLIPIDEMIA  . VIRAL URI  . ALLERGIC RHINITIS  . GERD  . HSV-1 infection  . Cerumen impaction  . Umbilical hernia    Assessment & Plan: Chronic umbilical hernia that   is sticking out and is symptomatic. Plan open repair with mesh. I have explained this to him and he wanted her to set up on Thursday or Friday if possible. We'll schedule a cone day surgery or at [place] with general endotracheal anesthesia.    Matt B. Gaia Gullikson, MD, FACS  [facility] [phone] beeper [phone]  03/13/2012 1:54 PM     

## 2012-03-13 NOTE — Patient Instructions (Signed)
Umbilical Herniorrhaphy Herniorrhaphy is surgery to repair a hernia. A hernia is the protrusion of a part of an organ through an abdominal opening. An umbilical hernia means that your hernia is in the area around your navel. If the hernia is not repaired, the gap could get bigger. Your intestines or other tissues, such as fat, could get trapped in the gap. This can lead to other health problems, such as blocked intestines. If the hernia is fixed before problems set in, you may be allowed to go home the same day as the surgery (outpatient). LET YOUR CAREGIVER KNOW ABOUT:  Allergies to food or medicine.  Medicines taken, including vitamins, herbs, eyedrops, over-the-counter medicines, and creams.  Use of steroids (by mouth or creams).  Previous problems with anesthetics or numbing medicines.  History of bleeding problems or blood clots.  Previous surgery.  Other health problems, including diabetes and kidney problems.  Possibility of pregnancy, if this applies. RISKS AND COMPLICATIONS  Pain.  Excessive bleeding.  Hematoma. This is a pocket of blood that collects under the surgery site.  Infection at the surgery site.  Numbness at the surgery site.  Swelling and bruising.  Blood clots.  Intestinal damage (rare).  Scarring.  Skin damage.  Development of another hernia. This may require another surgery. BEFORE THE PROCEDURE  Ask your caregiver about changing or stopping your regular medicines. You may need to stop taking aspirin, nonsteroidal anti-inflammatory drugs (NSAIDs), vitamin E, and blood thinners as early as 2 weeks before the procedure.  Do not eat or drink for 8 hours before the procedure, or as directed by your caregiver.  You might be asked to shower or wash with an antibacterial soap before the procedure.  Wear comfortable clothes that will be easy to put on after the procedure. PROCEDURE You will be given an intravenous (IV) tube. A needle will be  inserted in your arm. Medicine will flow directly into your body through this needle. You might be given medicine to help you relax (sedative). You will be given medicine that numbs the area (local anesthetic) or medicine that makes you sleep (general anesthetic). If you have open surgery:  The surgeon will make a cut (incision) in your abdomen.  The gap in the muscle wall will be repaired. The surgeon may sew the edges together over the gap or use a mesh material to strengthen the area. When mesh is used, the body grows new, strong tissue into and around it. This new tissue closes the gap.  A drain might be put in to remove excess fluid from the body after surgery.  The surgeon will close the incision with stitches, glue, or staples. If you have laparoscopic surgery:  The surgeon will make several small incisions in your abdomen.  A thin, lighted tube (laparoscope) will be inserted into the abdomen through an incision. A camera is attached to the laparoscope that allows the surgeon to see inside the abdomen.  Tools will be inserted through the other incisions to repair the hernia. Usually, mesh is used to cover the gap.  The surgeon will close the incisions with stitches. AFTER THE PROCEDURE  You will be taken to a recovery area. A nurse will watch and check your progress.  When you are awake, feeling well, and taking fluids well, you may be allowed to go home. In some cases, you may need to stay overnight in the hospital.  Arrange for someone to drive you home. Document Released: 04/05/2008 Document Revised: 07/09/2011 Document   Reviewed: 04/10/2011 [facility] Patient Information 2013 [place], [place].

## 2012-03-16 ENCOUNTER — Telehealth (INDEPENDENT_AMBULATORY_CARE_PROVIDER_SITE_OTHER): Payer: Self-pay

## 2012-03-16 NOTE — Telephone Encounter (Signed)
Spoke with pt in regards to his 1st po appt.  We discussed making it for a Monday or Friday (better for the pt) and realized that [facility] doesn't have any spots open on Mon/Fri.  We agreed to make it for Thursday, March 20 @ 1210p.

## 2012-03-23 ENCOUNTER — Encounter (HOSPITAL_BASED_OUTPATIENT_CLINIC_OR_DEPARTMENT_OTHER): Payer: Self-pay | Admitting: *Deleted

## 2012-03-23 NOTE — Progress Notes (Signed)
Denies any heart or resp problems

## 2012-03-24 ENCOUNTER — Encounter (INDEPENDENT_AMBULATORY_CARE_PROVIDER_SITE_OTHER): Payer: 59 | Admitting: Surgery

## 2012-03-25 ENCOUNTER — Encounter (HOSPITAL_BASED_OUTPATIENT_CLINIC_OR_DEPARTMENT_OTHER)
Admission: RE | Disposition: A | Discharge status: Home / Self Care | Payer: Self-pay | Source: Ambulatory Visit | Attending: Surgery

## 2012-03-25 ENCOUNTER — Ambulatory Visit (HOSPITAL_BASED_OUTPATIENT_CLINIC_OR_DEPARTMENT_OTHER)
Admission: RE | Admit: 2012-03-25 | Discharge: 2012-03-25 | Disposition: A | Discharge status: Home / Self Care | Payer: 59 | Source: Ambulatory Visit | Attending: Surgery | Admitting: Surgery

## 2012-03-25 ENCOUNTER — Encounter (HOSPITAL_BASED_OUTPATIENT_CLINIC_OR_DEPARTMENT_OTHER): Payer: Self-pay | Admitting: *Deleted

## 2012-03-25 ENCOUNTER — Telehealth (INDEPENDENT_AMBULATORY_CARE_PROVIDER_SITE_OTHER): Payer: Self-pay | Admitting: General Surgery

## 2012-03-25 ENCOUNTER — Encounter (HOSPITAL_BASED_OUTPATIENT_CLINIC_OR_DEPARTMENT_OTHER): Payer: Self-pay | Admitting: Anesthesiology

## 2012-03-25 ENCOUNTER — Ambulatory Visit (HOSPITAL_BASED_OUTPATIENT_CLINIC_OR_DEPARTMENT_OTHER): Payer: 59 | Admitting: Anesthesiology

## 2012-03-25 DIAGNOSIS — B009 Herpesviral infection, unspecified: Secondary | ICD-10-CM | POA: Insufficient documentation

## 2012-03-25 DIAGNOSIS — K429 Umbilical hernia without obstruction or gangrene: Secondary | ICD-10-CM

## 2012-03-25 DIAGNOSIS — E785 Hyperlipidemia, unspecified: Secondary | ICD-10-CM | POA: Insufficient documentation

## 2012-03-25 DIAGNOSIS — Z79899 Other long term (current) drug therapy: Secondary | ICD-10-CM | POA: Insufficient documentation

## 2012-03-25 DIAGNOSIS — IMO0002 Reserved for concepts with insufficient information to code with codable children: Secondary | ICD-10-CM | POA: Insufficient documentation

## 2012-03-25 DIAGNOSIS — K420 Umbilical hernia with obstruction, without gangrene: Secondary | ICD-10-CM | POA: Insufficient documentation

## 2012-03-25 DIAGNOSIS — Z7982 Long term (current) use of aspirin: Secondary | ICD-10-CM | POA: Insufficient documentation

## 2012-03-25 DIAGNOSIS — J45909 Unspecified asthma, uncomplicated: Secondary | ICD-10-CM | POA: Insufficient documentation

## 2012-03-25 DIAGNOSIS — K219 Gastro-esophageal reflux disease without esophagitis: Secondary | ICD-10-CM | POA: Insufficient documentation

## 2012-03-25 DIAGNOSIS — Z6834 Body mass index (BMI) 34.0-34.9, adult: Secondary | ICD-10-CM | POA: Insufficient documentation

## 2012-03-25 DIAGNOSIS — J309 Allergic rhinitis, unspecified: Secondary | ICD-10-CM | POA: Insufficient documentation

## 2012-03-25 HISTORY — PX: UMBILICAL HERNIA REPAIR: SHX196

## 2012-03-25 LAB — POCT HEMOGLOBIN-HEMACUE: Hemoglobin: 15 g/dL (ref 13.0–17.0)

## 2012-03-25 SURGERY — REPAIR, HERNIA, UMBILICAL, ADULT
Anesthesia: General | Wound class: Clean

## 2012-03-25 MED ORDER — MIDAZOLAM HCL 5 MG/5ML IJ SOLN
INTRAMUSCULAR | Status: DC | PRN
  Administered 2012-03-25: 2 mg via INTRAVENOUS

## 2012-03-25 MED ORDER — ACETAMINOPHEN 10 MG/ML IV SOLN
1000.0000 mg | Freq: Once | INTRAVENOUS | Status: AC
  Administered 2012-03-25: 1000 mg via INTRAVENOUS

## 2012-03-25 MED ORDER — MEPERIDINE HCL 25 MG/ML IJ SOLN: 6.2500 mg | INTRAMUSCULAR | Status: DC | PRN

## 2012-03-25 MED ORDER — HYDROMORPHONE HCL PF 1 MG/ML IJ SOLN: 0.2500 mg | INTRAMUSCULAR | Status: DC | PRN

## 2012-03-25 MED ORDER — DEXAMETHASONE SODIUM PHOSPHATE 4 MG/ML IJ SOLN
INTRAMUSCULAR | Status: DC | PRN
  Administered 2012-03-25: 10 mg via INTRAVENOUS

## 2012-03-25 MED ORDER — MIDAZOLAM HCL 2 MG/2ML IJ SOLN: 0.5000 mg | Freq: Once | INTRAMUSCULAR | Status: DC | PRN

## 2012-03-25 MED ORDER — LIDOCAINE HCL (CARDIAC) 20 MG/ML IV SOLN
INTRAVENOUS | Status: DC | PRN
  Administered 2012-03-25: 75 mg via INTRAVENOUS

## 2012-03-25 MED ORDER — FENTANYL CITRATE 0.05 MG/ML IJ SOLN
INTRAMUSCULAR | Status: DC | PRN
  Administered 2012-03-25: 25 ug via INTRAVENOUS
  Administered 2012-03-25: 100 ug via INTRAVENOUS

## 2012-03-25 MED ORDER — NEOSTIGMINE METHYLSULFATE 1 MG/ML IJ SOLN
INTRAMUSCULAR | Status: DC | PRN
  Administered 2012-03-25: 3 mg via INTRAVENOUS

## 2012-03-25 MED ORDER — OXYCODONE HCL 5 MG PO TABS: 5.0000 mg | ORAL_TABLET | Freq: Once | ORAL | Status: DC | PRN

## 2012-03-25 MED ORDER — OXYCODONE HCL 5 MG PO TABS
5.0000 mg | ORAL_TABLET | Freq: Once | ORAL | Status: AC | PRN
  Administered 2012-03-25: 5 mg via ORAL

## 2012-03-25 MED ORDER — PROMETHAZINE HCL 25 MG/ML IJ SOLN: 6.2500 mg | INTRAMUSCULAR | Status: DC | PRN

## 2012-03-25 MED ORDER — FENTANYL CITRATE 0.05 MG/ML IJ SOLN: 50.0000 ug | INTRAMUSCULAR | Status: DC | PRN

## 2012-03-25 MED ORDER — ONDANSETRON HCL 4 MG/2ML IJ SOLN
INTRAMUSCULAR | Status: DC | PRN
  Administered 2012-03-25: 4 mg via INTRAVENOUS

## 2012-03-25 MED ORDER — ROCURONIUM BROMIDE 100 MG/10ML IV SOLN
INTRAVENOUS | Status: DC | PRN
  Administered 2012-03-25: 10 mg via INTRAVENOUS
  Administered 2012-03-25: 40 mg via INTRAVENOUS

## 2012-03-25 MED ORDER — CHLORHEXIDINE GLUCONATE 4 % EX LIQD: 1.0000 "application " | Freq: Once | CUTANEOUS | Status: DC

## 2012-03-25 MED ORDER — PROPOFOL 10 MG/ML IV BOLUS
INTRAVENOUS | Status: DC | PRN
  Administered 2012-03-25: 200 mg via INTRAVENOUS

## 2012-03-25 MED ORDER — HEPARIN SODIUM (PORCINE) 5000 UNIT/ML IJ SOLN
5000.0000 [IU] | Freq: Once | INTRAMUSCULAR | Status: AC
  Administered 2012-03-25: 5000 [IU] via SUBCUTANEOUS

## 2012-03-25 MED ORDER — CEFAZOLIN SODIUM-DEXTROSE 2-3 GM-% IV SOLR
2.0000 g | INTRAVENOUS | Status: AC
  Administered 2012-03-25: 2 g via INTRAVENOUS

## 2012-03-25 MED ORDER — OXYCODONE HCL 5 MG/5ML PO SOLN: 5.0000 mg | Freq: Once | ORAL | Status: AC | PRN

## 2012-03-25 MED ORDER — LACTATED RINGERS IV SOLN
INTRAVENOUS | Status: DC
Start: 2012-03-25 — End: 2012-03-25
  Administered 2012-03-25 (×2): via INTRAVENOUS

## 2012-03-25 MED ORDER — BUPIVACAINE LIPOSOME 1.3 % IJ SUSP
INTRAMUSCULAR | Status: DC | PRN
  Administered 2012-03-25: 20 mL

## 2012-03-25 MED ORDER — MIDAZOLAM HCL 2 MG/2ML IJ SOLN: 1.0000 mg | INTRAMUSCULAR | Status: DC | PRN

## 2012-03-25 MED ORDER — GLYCOPYRROLATE 0.2 MG/ML IJ SOLN
INTRAMUSCULAR | Status: DC | PRN
  Administered 2012-03-25: 0.4 mg via INTRAVENOUS

## 2012-03-25 SURGICAL SUPPLY — 49 items
APPLICATOR COTTON TIP 6IN STRL (MISCELLANEOUS) IMPLANT
BENZOIN TINCTURE PRP APPL 2/3 (GAUZE/BANDAGES/DRESSINGS) IMPLANT
BINDER ABD UNIV 12 30-45 (MISCELLANEOUS) ×1 IMPLANT
BINDER ABDOMINAL 12 (MISCELLANEOUS) ×2
BLADE SURG 15 STRL LF DISP TIS (BLADE) ×1 IMPLANT
BLADE SURG 15 STRL SS (BLADE) ×1
BLADE SURG ROTATE 9660 (MISCELLANEOUS) ×2 IMPLANT
CANISTER SUCTION 1200CC (MISCELLANEOUS) ×2 IMPLANT
CLEANER CAUTERY TIP 5X5 PAD (MISCELLANEOUS) ×1 IMPLANT
CLOTH BEACON ORANGE TIMEOUT ST (SAFETY) ×2 IMPLANT
COTTONBALL LRG STERILE PKG (GAUZE/BANDAGES/DRESSINGS) ×2 IMPLANT
COVER MAYO STAND STRL (DRAPES) ×2 IMPLANT
COVER TABLE BACK 60X90 (DRAPES) ×2 IMPLANT
DECANTER SPIKE VIAL GLASS SM (MISCELLANEOUS) ×2 IMPLANT
DERMABOND ADVANCED (GAUZE/BANDAGES/DRESSINGS) ×1
DERMABOND ADVANCED .7 DNX12 (GAUZE/BANDAGES/DRESSINGS) ×1 IMPLANT
DRAPE LAPAROTOMY T 102X78X121 (DRAPES) ×2 IMPLANT
ELECT REM PT RETURN 9FT ADLT (ELECTROSURGICAL) ×2
ELECTRODE REM PT RTRN 9FT ADLT (ELECTROSURGICAL) ×1 IMPLANT
GAUZE SPONGE 4X4 12PLY STRL LF (GAUZE/BANDAGES/DRESSINGS) IMPLANT
GLOVE BIO SURGEON STRL SZ8 (GLOVE) ×2 IMPLANT
GLOVE EXAM NITRILE MD LF STRL (GLOVE) ×2 IMPLANT
GLOVE SKINSENSE NS SZ7.0 (GLOVE) ×1
GLOVE SKINSENSE STRL SZ7.0 (GLOVE) ×1 IMPLANT
GOWN PREVENTION PLUS XLARGE (GOWN DISPOSABLE) ×4 IMPLANT
GOWN PREVENTION PLUS XXLARGE (GOWN DISPOSABLE) IMPLANT
NDL SAFETY ECLIPSE 18X1.5 (NEEDLE) IMPLANT
NEEDLE HYPO 18GX1.5 SHARP (NEEDLE)
NEEDLE HYPO 25X1 1.5 SAFETY (NEEDLE) IMPLANT
NS IRRIG 1000ML POUR BTL (IV SOLUTION) ×2 IMPLANT
PACK BASIN DAY SURGERY FS (CUSTOM PROCEDURE TRAY) ×2 IMPLANT
PAD CLEANER CAUTERY TIP 5X5 (MISCELLANEOUS) ×1
PATCH VENTRAL SMALL 4.3 (Mesh Specialty) ×2 IMPLANT
PENCIL BUTTON HOLSTER BLD 10FT (ELECTRODE) ×2 IMPLANT
SLEEVE SCD COMPRESS KNEE MED (MISCELLANEOUS) ×2 IMPLANT
STAPLER VISISTAT 35W (STAPLE) IMPLANT
STRIP CLOSURE SKIN 1/2X4 (GAUZE/BANDAGES/DRESSINGS) IMPLANT
SUT PROLENE 0 CT 1 30 (SUTURE) IMPLANT
SUT PROLENE 0 CT 1 CR/8 (SUTURE) ×2 IMPLANT
SUT VIC AB 4-0 BRD 54 (SUTURE) IMPLANT
SUT VIC AB 4-0 SH 18 (SUTURE) ×2 IMPLANT
SUT VICRYL 4-0 PS2 18IN ABS (SUTURE) ×2 IMPLANT
SYR BULB 3OZ (MISCELLANEOUS) ×2 IMPLANT
SYR CONTROL 10ML LL (SYRINGE) IMPLANT
TOWEL OR 17X24 6PK STRL BLUE (TOWEL DISPOSABLE) ×2 IMPLANT
TRAY DSU PREP LF (CUSTOM PROCEDURE TRAY) ×2 IMPLANT
TUBE CONNECTING 20X1/4 (TUBING) ×2 IMPLANT
WATER STERILE IRR 1000ML POUR (IV SOLUTION) IMPLANT
YANKAUER SUCT BULB TIP NO VENT (SUCTIONS) ×2 IMPLANT

## 2012-03-25 NOTE — H&P (View-Only) (Signed)
Chief Complaint:  Chronic umbilical hernia it is intermittently tender  History of Present Illness:  Connor Thomas is an 52 y.o. male Who has had an umbilical hernia for a number of years but recently it has increased in size and become intermittently tender. It would appear that this contains only preperitoneal fat at the present.  Past Medical History  Diagnosis Date  . Allergy   . Ganglion of right wrist   . GERD (gastroesophageal reflux disease)   . Hyperlipidemia     Past Surgical History  Procedure Laterality Date  . Tonsillectomy and adenoidectomy    . Left knee scope    . Esophagogastroduodenoscopy  04/25/06    Current Outpatient Prescriptions  Medication Sig Dispense Refill  . albuterol (PROVENTIL HFA) 108 (90 BASE) MCG/ACT inhaler Inhale 1 puff into the lungs 4 (four) times daily. 2 ps quid as needed  18 g  2  . aspirin 81 MG tablet Take 81 mg by mouth daily.        . cetirizine (ZYRTEC) 10 MG tablet Take 10 mg by mouth daily.        . fluticasone (FLONASE) 50 MCG/ACT nasal spray Place 2 sprays into the nose daily.  16 g  11  . omeprazole (PRILOSEC) 20 MG capsule Take 1 capsule (20 mg total) by mouth daily.  100 capsule  3  . simvastatin (ZOCOR) 20 MG tablet Take 1 tablet (20 mg total) by mouth at bedtime.  100 tablet  3   No current facility-administered medications for this visit.   Review of patient's allergies indicates not on file. Family History  Problem Relation Age of Onset  .      Social History:   reports that he quit smoking about 6 years ago. His smoking use included Cigarettes. He smoked 0.00 packs per day. He does not have any smokeless tobacco history on file. He reports that  drinks alcohol. He reports that he does not use illicit drugs.   REVIEW OF SYSTEMS - PERTINENT POSITIVES ONLY: Noncontributory  Physical Exam:   Blood pressure 112/70, pulse 72, resp. rate 18, height 5\' 8"  (1.727 m), weight 222 lb (100.699 kg). Body mass index is 33.76  kg/(m^2).  Gen:  WDWN WM NAD  Neurological: Alert and oriented to person, place, and time. Motor and sensory function is grossly intact  Head: Normocephalic and atraumatic.  Eyes: Conjunctivae are normal. Pupils are equal, round, and reactive to light. No scleral icterus.  Neck: Normal range of motion. Neck supple. No tracheal deviation or thyromegaly present.  Cardiovascular:  SR without murmurs or gallops.  No carotid bruits Respiratory: Effort normal.  No respiratory distress. No chest wall tenderness. Breath sounds normal.  No wheezes, rales or rhonchi.  Abdomen:  Prominent soft umbilical hernia reducible GU: Musculoskeletal: Normal range of motion. Extremities are nontender. No cyanosis, edema or clubbing noted Lymphadenopathy: No cervical, preauricular, postauricular or axillary adenopathy is present Skin: Skin is warm and dry. No rash noted. No diaphoresis. No erythema. No pallor. Pscyh: Normal mood and affect. Behavior is normal. Judgment and thought content normal.   LABORATORY RESULTS: No results found for this or any previous visit (from the past 48 hour(s)).  RADIOLOGY RESULTS: No results found.  Problem List: Patient Active Problem List  Diagnosis  . HYPERLIPIDEMIA  . VIRAL URI  . ALLERGIC RHINITIS  . GERD  . HSV-1 infection  . Cerumen impaction  . Umbilical hernia    Assessment & Plan: Chronic umbilical hernia that  is sticking out and is symptomatic. Plan open repair with mesh. I have explained this to him and he wanted her to set up on Thursday or Friday if possible. We'll schedule a cone day surgery or at [facility] with general endotracheal anesthesia.    [name], MD, [facility] [phone] beeper [phone]  03/13/2012 1:54 PM

## 2012-03-25 NOTE — Anesthesia Postprocedure Evaluation (Signed)
  Anesthesia Post-op Note  Patient: Connor Thomas  Procedure(s) Performed: Procedure(s): HERNIA REPAIR-- UMBILICAL-- ADULT (N/A)  Patient Location: PACU  Anesthesia Type:General  Level of Consciousness: awake, alert  and oriented  Airway and Oxygen Therapy: Patient Spontanous Breathing  Post-op Pain: mild  Post-op Assessment: Post-op Vital signs reviewed, Patient's Cardiovascular Status Stable, Respiratory Function Stable, Patent Airway and No signs of Nausea or vomiting  Post-op Vital Signs: Reviewed and stable  Complications: No apparent anesthesia complications

## 2012-03-25 NOTE — Transfer of Care (Signed)
Immediate Anesthesia Transfer of Care Note  Patient: Connor Thomas  Procedure(s) Performed: Procedure(s): HERNIA REPAIR-- UMBILICAL-- ADULT (N/A)  Patient Location: PACU  Anesthesia Type:General  Level of Consciousness: awake and patient cooperative  Airway & Oxygen Therapy: Patient Spontanous Breathing and Patient connected to face mask oxygen  Post-op Assessment: Report given to PACU RN and Post -op Vital signs reviewed and stable  Post vital signs: Reviewed and stable  Complications: No apparent anesthesia complications

## 2012-03-25 NOTE — Anesthesia Preprocedure Evaluation (Addendum)
Anesthesia Evaluation  Patient identified by MRN, date of birth, ID band Patient awake    Reviewed: Allergy & Precautions, H&P , NPO status , Patient's Chart, lab work & pertinent test results  History of Anesthesia Complications Negative for: history of anesthetic complications  Airway Mallampati: II TM Distance: <3 FB Neck ROM: Full    Dental  (+) Teeth Intact and Dental Advisory Given   Pulmonary asthma (last inhaler 6 months ago) , former smoker (quit 3 years),  breath sounds clear to auscultation        Cardiovascular negative cardio ROS  Rhythm:Regular Rate:Normal     Neuro/Psych negative neurological ROS     GI/Hepatic Neg liver ROS, GERD-  Medicated and Controlled,  Endo/Other  Morbid obesity  Renal/GU negative Renal ROS     Musculoskeletal   Abdominal (+) + obese,   Peds  Hematology   Anesthesia Other Findings   Reproductive/Obstetrics                           Anesthesia Physical Anesthesia Plan  ASA: II  Anesthesia Plan: General   Post-op Pain Management:    Induction: Intravenous  Airway Management Planned: Oral ETT  Additional Equipment:   Intra-op Plan:   Post-operative Plan: Extubation in OR  Informed Consent: I have reviewed the patients History and Physical, chart, labs and discussed the procedure including the risks, benefits and alternatives for the proposed anesthesia with the patient or authorized representative who has indicated his/her understanding and acceptance.   Dental advisory given  Plan Discussed with: CRNA and Surgeon  Anesthesia Plan Comments: (Plan routine monitors, GA- ETT OK)       Anesthesia Quick Evaluation

## 2012-03-25 NOTE — Op Note (Signed)
Surgeon: Wenda Low, MD, FACS  Asst:  none  Anes:  General endotracheal  Procedure: Open umbilical hernia repair with Proceed circular 4 cm patch  Diagnosis: Umbilical hernia  Complications: none  EBL:   minimal cc  Description of Procedure:  The patient was taken to oh or 3 at [facility] day surgery. General endotracheal anesthesia was administered and the umbilical region was clipped and prepped with PCMX and draped sterilely. A timeout was performed and a curvilinear incision was made infraumbilically. The umbilical skin was raised off this chronically incarcerated umbilical hernia without significantly undermining the skin but getting above the defect. The fascial defect was then freed at the ring level so that it could be reduced below the fascia and a finger inserted and swept around range clear space for the proceed mesh patch. This was then inserted into the hole which was about a centimeter in diameter and deployed using the straps to pull it up against the fascia and then 3 sutures of 0 Prolene were placed fixing the patch to the fascia superiorly and inferiorly and then 2 more sutures were placed medially and laterally to tack it to the fascia all the way around. The fascia was then infiltrated with Exparel and the wound closed in layers with 4-0 Vicryl subcutaneously and tacking the skin to the fascia and then subcuticularly and with Dermabond. Abdominal binder was applied the patient was taken recovery room in satisfactory condition  [name], MD, [facility], [place] [phone]

## 2012-03-25 NOTE — Telephone Encounter (Signed)
Pt's wife called to ask if he can take his Prilosec now.  Per VO Dr. Daphine Deutscher, he can take any of his routine meds.  She understands.

## 2012-03-25 NOTE — Interval H&P Note (Signed)
History and Physical Interval Note:  03/25/2012 9:58 AM  Connor Thomas  has presented today for surgery, with the diagnosis of umbilical hernia  The various methods of treatment have been discussed with the patient and family. After consideration of risks, benefits and other options for treatment, the patient has consented to  Procedure(s): HERNIA REPAIR UMBILICAL ADULT (N/A) as a surgical intervention .  The patient's history has been reviewed, patient examined, no change in status, stable for surgery.  I have reviewed the patient's chart and labs.  Questions were answered to the patient's satisfaction.     MARTIN,MATTHEW B

## 2012-03-26 ENCOUNTER — Encounter (HOSPITAL_BASED_OUTPATIENT_CLINIC_OR_DEPARTMENT_OTHER): Payer: Self-pay | Admitting: Surgery

## 2012-03-30 ENCOUNTER — Telehealth (INDEPENDENT_AMBULATORY_CARE_PROVIDER_SITE_OTHER): Payer: Self-pay | Admitting: General Surgery

## 2012-03-30 NOTE — Telephone Encounter (Signed)
Pt called with generalized postop questions after his umbilical hernia repair.   His surgery was last, mid-week and has been without complications.  Today he noted an increase in swelling with fluid accumulation at the umbilical site, causing an "out-y."  Reassured pt that swelling is expected after surgery and will resolve over time.  He understands to call if any fever, increase in pain or purulent drainage; otherwise, okay to see some clear, pinkish drainage oozing.  If so, he will cover with DSD.

## 2012-04-09 ENCOUNTER — Encounter (INDEPENDENT_AMBULATORY_CARE_PROVIDER_SITE_OTHER): Payer: Self-pay | Admitting: Surgery

## 2012-04-09 ENCOUNTER — Ambulatory Visit (INDEPENDENT_AMBULATORY_CARE_PROVIDER_SITE_OTHER): Payer: 59 | Admitting: Surgery

## 2012-04-09 VITALS — BP 121/80 | HR 72 | Temp 98.0°F | Resp 14 | Ht 68.0 in | Wt 220.6 lb

## 2012-04-09 DIAGNOSIS — K429 Umbilical hernia without obstruction or gangrene: Secondary | ICD-10-CM

## 2012-04-09 NOTE — Progress Notes (Signed)
Connor Thomas 52 y.o.  Body mass index is 33.55 kg/(m^2).  Patient Active Problem List  Diagnosis  . HYPERLIPIDEMIA  . VIRAL URI  . ALLERGIC RHINITIS  . GERD  . HSV-1 infection  . Cerumen impaction  . Umbilical hernia    No Known Allergies  Past Surgical History  Procedure Laterality Date  . Tonsillectomy and adenoidectomy    . Left knee scope    . Esophagogastroduodenoscopy  04/25/06  . Umbilical hernia repair N/A 03/25/2012    Procedure: HERNIA REPAIR-- UMBILICAL-- ADULT;  Surgeon: Valarie Merino, MD;  Location: [facility];  Service: General;  Laterality: N/A;  . Hernia repair     TODD,JEFFREY ALLEN, MD No diagnosis found.  [name] Had an umbilical hernia with a mesh plug sutured to the perimeter of his fascial defect. It looks like he is getting some peritoneal fluid that is seeping and distending his skin off the repair when he stands. I don't feel a fascial defect and when he lies back down the distention received. I told him to put some cotton ball in this and to keep the dressing on it so that it keeps it in hopefully will scar down. I will  see him back in about 4-6 weeks to reassess. [name], MD, [facility] [phone] beeper [phone]  04/09/2012 1:12 PM

## 2012-06-05 ENCOUNTER — Encounter (INDEPENDENT_AMBULATORY_CARE_PROVIDER_SITE_OTHER): Payer: Self-pay | Admitting: Surgery

## 2012-06-05 ENCOUNTER — Ambulatory Visit (INDEPENDENT_AMBULATORY_CARE_PROVIDER_SITE_OTHER): Payer: 59 | Admitting: Surgery

## 2012-06-05 VITALS — BP 154/76 | Temp 97.9°F | Ht 68.0 in | Wt 221.6 lb

## 2012-06-05 DIAGNOSIS — K429 Umbilical hernia without obstruction or gangrene: Secondary | ICD-10-CM

## 2012-06-05 NOTE — Patient Instructions (Signed)
Thanks for your patience.  If you need further assistance after leaving the office, please call our office and speak with a [facility] nurse.  [phone].  If you want to leave a message for Dr. Collins Dimaria, please call his office phone at [phone]. 

## 2012-06-05 NOTE — Progress Notes (Signed)
Connor Thomas 52 y.o.  Body mass index is 33.7 kg/(m^2).  Patient Active Problem List   Diagnosis Date Noted  . Umbilical hernia 03/13/2012  . Cerumen impaction 02/24/2012  . HSV-1 infection 05/01/2010  . HYPERLIPIDEMIA 02/27/2007  . VIRAL URI 01/29/2007  . ALLERGIC RHINITIS 09/12/2006  . GERD 09/12/2006    No Known Allergies  Past Surgical History  Procedure Laterality Date  . Tonsillectomy and adenoidectomy    . Left knee scope    . Esophagogastroduodenoscopy  04/25/06  . Umbilical hernia repair N/A 03/25/2012    Procedure: HERNIA REPAIR-- UMBILICAL-- ADULT;  Surgeon: Valarie Merino, MD;  Location: [facility];  Service: General;  Laterality: N/A;  . Hernia repair     TODD,JEFFREY ALLEN, MD No diagnosis found.  Connor Thomas returns today after his Umbilical hernia repair done March 5th.  The skin has a fixed itself to the abdominal wall and the seromatous completely resorbed spontaneously. Incision looks good. He needs to lose weight of possible that he is aware that. He's doing well at home he could return to full activity. He'll be glad to see me again when necessary [name], MD, [facility] [phone] beeper [phone]  06/05/2012 12:05 PM

## 2012-07-13 ENCOUNTER — Other Ambulatory Visit (INDEPENDENT_AMBULATORY_CARE_PROVIDER_SITE_OTHER): Payer: 59

## 2012-07-13 DIAGNOSIS — Z0000 Encounter for general adult medical examination without abnormal findings: Secondary | ICD-10-CM

## 2012-07-13 LAB — CBC WITH DIFFERENTIAL/PLATELET
Basophils Absolute: 0 10*3/uL (ref 0.0–0.1)
Basophils Relative: 0.5 % (ref 0.0–3.0)
Eosinophils Absolute: 0.3 10*3/uL (ref 0.0–0.7)
Lymphocytes Relative: 31.3 % (ref 12.0–46.0)
MCHC: 34.2 g/dL (ref 30.0–36.0)
Neutrophils Relative %: 52.4 % (ref 43.0–77.0)
RBC: 4.56 Mil/uL (ref 4.22–5.81)
RDW: 12.9 % (ref 11.5–14.6)

## 2012-07-13 LAB — LIPID PANEL
Cholesterol: 185 mg/dL (ref 0–200)
HDL: 47.6 mg/dL (ref >39.00)
VLDL: 18.4 mg/dL (ref 0.0–40.0)

## 2012-07-13 LAB — HEPATIC FUNCTION PANEL
Alkaline Phosphatase: 78 U/L (ref 39–117)
Bilirubin, Direct: 0.1 mg/dL (ref 0.0–0.3)
Total Bilirubin: 0.5 mg/dL (ref 0.3–1.2)

## 2012-07-13 LAB — POCT URINALYSIS DIPSTICK
Bilirubin, UA: NEGATIVE
Glucose, UA: NEGATIVE
Leukocytes, UA: NEGATIVE

## 2012-07-13 LAB — BASIC METABOLIC PANEL
CO2: 28 mEq/L (ref 19–32)
Calcium: 9.2 mg/dL (ref 8.4–10.5)
Creatinine, Ser: 0.9 mg/dL (ref 0.4–1.5)

## 2012-07-20 ENCOUNTER — Encounter: Payer: 59 | Admitting: Family Medicine

## 2012-07-27 ENCOUNTER — Ambulatory Visit (INDEPENDENT_AMBULATORY_CARE_PROVIDER_SITE_OTHER): Payer: 59 | Admitting: Family Medicine

## 2012-07-27 ENCOUNTER — Encounter: Payer: Self-pay | Admitting: Family Medicine

## 2012-07-27 VITALS — BP 120/80 | Temp 98.9°F | Ht 67.5 in | Wt 231.0 lb

## 2012-07-27 DIAGNOSIS — J309 Allergic rhinitis, unspecified: Secondary | ICD-10-CM

## 2012-07-27 DIAGNOSIS — E785 Hyperlipidemia, unspecified: Secondary | ICD-10-CM

## 2012-07-27 DIAGNOSIS — F32A Depression, unspecified: Secondary | ICD-10-CM

## 2012-07-27 DIAGNOSIS — K219 Gastro-esophageal reflux disease without esophagitis: Secondary | ICD-10-CM

## 2012-07-27 DIAGNOSIS — F329 Major depressive disorder, single episode, unspecified: Secondary | ICD-10-CM

## 2012-07-27 MED ORDER — SIMVASTATIN 20 MG PO TABS: 20.0000 mg | ORAL_TABLET | Freq: Every day | ORAL | Status: DC

## 2012-07-27 MED ORDER — CITALOPRAM HYDROBROMIDE 20 MG PO TABS: 20.0000 mg | ORAL_TABLET | Freq: Every day | ORAL | Status: DC

## 2012-07-27 MED ORDER — OMEPRAZOLE 20 MG PO CPDR: 20.0000 mg | DELAYED_RELEASE_CAPSULE | Freq: Every day | ORAL | Status: DC

## 2012-07-27 MED ORDER — ALBUTEROL SULFATE HFA 108 (90 BASE) MCG/ACT IN AERS: 1.0000 | INHALATION_SPRAY | Freq: Four times a day (QID) | RESPIRATORY_TRACT | Status: DC

## 2012-07-27 MED ORDER — FLUTICASONE PROPIONATE 50 MCG/ACT NA SUSP: 2.0000 | Freq: Every day | NASAL | Status: DC

## 2012-07-27 NOTE — Progress Notes (Signed)
  Subjective:    Patient ID: CARA AGUINO, male    DOB: 05-21-1960, 52 y.o.   MRN: [id number]  HPI Formica is a 52 year old married male nonsmoker who comes in today for general physical examination  He uses albuterol when necessary for exercise-induced asthma, Zyrtec 10 mg each bedtime for allergic rhinitis, also steroid nasal spray. He's his Prilosec because of reflux esophagitis and Zocor 20 mg daily for hyperlipidemia  He says he feels well and has no complaints except for 2. He's had pain in his left elbow for couple months. He thinks it was trigger by playing golf.  He's noticed for the past many many years she's had some mild depression but is not able to fight through it. 6 months ago for reasons unknown he began having more symptoms of depression which he describes as a short temper with his wife and his son Joselyn Glassman who is 60 years of age. He's also noticed a depressed mood we doesn't feel I get the wound anywhere. No suicidal ideation. He also complains of feeling tired. Sleep dysfunction denied.  Family history positive for depression mom and father  He gets routine eye care, dental care, colonoscopy, vaccinations up-to-date   Review of Systems  Constitutional: Negative.   HENT: Negative.   Eyes: Negative.   Respiratory: Negative.   Cardiovascular: Negative.   Gastrointestinal: Negative.   Genitourinary: Negative.   Musculoskeletal: Negative.   Skin: Negative.   Neurological: Negative.   Psychiatric/Behavioral: Negative.        Objective:   Physical Exam  Constitutional: He is oriented to person, place, and time. He appears well-developed and well-nourished.  HENT:  Head: Normocephalic and atraumatic.  Right Ear: External ear normal.  Left Ear: External ear normal.  Nose: Nose normal.  Mouth/Throat: Oropharynx is clear and moist.  Eyes: Conjunctivae and EOM are normal. Pupils are equal, round, and reactive to light.  Neck: Normal range of motion. Neck supple. No  JVD present. No tracheal deviation present. No thyromegaly present.  Cardiovascular: Normal rate, regular rhythm, normal heart sounds and intact distal pulses.  Exam reveals no gallop and no friction rub.   No murmur heard. Pulmonary/Chest: Effort normal and breath sounds normal. No stridor. No respiratory distress. He has no wheezes. He has no rales. He exhibits no tenderness.  Abdominal: Soft. Bowel sounds are normal. He exhibits no distension and no mass. There is no tenderness. There is no rebound and no guarding.  Genitourinary: Rectum normal, prostate normal and penis normal. Guaiac negative stool. No penile tenderness.  Musculoskeletal: Normal range of motion. He exhibits no edema and no tenderness.  Lymphadenopathy:    He has no cervical adenopathy.  Neurological: He is alert and oriented to person, place, and time. He has normal reflexes. No cranial nerve deficit. He exhibits normal muscle tone.  Skin: Skin is warm and dry. No rash noted. No erythema. No pallor.  Total body skin exam normal  Psychiatric: He has a normal mood and affect. His behavior is normal. Judgment and thought content normal.          Assessment & Plan:  Healthy male  Allergic rhinitis continue current therapy  Occasional asthma albuterol when necessary  Reflux esophagitis Prilosec 20 daily  Hyperlipidemia continue Zocor and aspirin daily  Mild depression begin Celexa 20 mg daily followup in 4 weeks

## 2012-07-27 NOTE — Patient Instructions (Addendum)
Celexa 20 mg,,,,,,,,,, begin one half tab each bedtime for 2 weeks then increase to one full tablet at bedtime  Return in 4 weeks for followup  Continue your other medications  Begin a 30 minute walking and/or exercise program daily

## 2012-08-31 ENCOUNTER — Ambulatory Visit: Payer: 59 | Admitting: Family Medicine

## 2012-11-21 DIAGNOSIS — D0339 Melanoma in situ of other parts of face: Secondary | ICD-10-CM

## 2012-11-21 HISTORY — DX: Melanoma in situ of other parts of face: D03.39

## 2013-08-02 ENCOUNTER — Other Ambulatory Visit: Payer: 59

## 2013-08-12 ENCOUNTER — Encounter: Payer: 59 | Admitting: Family Medicine

## 2013-08-19 ENCOUNTER — Telehealth: Payer: Self-pay | Admitting: Family Medicine

## 2013-08-19 ENCOUNTER — Encounter: Payer: 59 | Admitting: Family Medicine

## 2013-08-19 MED ORDER — OMEPRAZOLE 20 MG PO CPDR: 20.0000 mg | DELAYED_RELEASE_CAPSULE | Freq: Every day | ORAL | Status: DC

## 2013-08-19 NOTE — Telephone Encounter (Addendum)
Pt has cpe in august 24 but has run out of omeprazole (PRILOSEC) 20 MG capsule [facility] you send to [facility]

## 2013-09-06 ENCOUNTER — Other Ambulatory Visit (INDEPENDENT_AMBULATORY_CARE_PROVIDER_SITE_OTHER): Payer: 59

## 2013-09-06 DIAGNOSIS — Z0000 Encounter for general adult medical examination without abnormal findings: Secondary | ICD-10-CM

## 2013-09-06 LAB — BASIC METABOLIC PANEL
BUN: 17 mg/dL (ref 6–23)
CHLORIDE: 103 meq/L (ref 96–112)
CO2: 28 meq/L (ref 19–32)
CREATININE: 1.1 mg/dL (ref 0.4–1.5)
Calcium: 9.7 mg/dL (ref 8.4–10.5)
GFR: 75.93 mL/min (ref >60.00)
Glucose, Bld: 87 mg/dL (ref 70–99)
Potassium: 5.1 mEq/L (ref 3.5–5.1)
Sodium: 140 mEq/L (ref 135–145)

## 2013-09-06 LAB — HEPATIC FUNCTION PANEL
ALT: 26 U/L (ref 0–53)
AST: 21 U/L (ref 0–37)
Albumin: 4.6 g/dL (ref 3.5–5.2)
Alkaline Phosphatase: 74 U/L (ref 39–117)
BILIRUBIN DIRECT: 0.1 mg/dL (ref 0.0–0.3)
BILIRUBIN TOTAL: 0.7 mg/dL (ref 0.2–1.2)
TOTAL PROTEIN: 7 g/dL (ref 6.0–8.3)

## 2013-09-06 LAB — POCT URINALYSIS DIPSTICK
Bilirubin, UA: NEGATIVE
Glucose, UA: NEGATIVE
Ketones, UA: NEGATIVE
Leukocytes, UA: NEGATIVE
Nitrite, UA: NEGATIVE
PH UA: 5
Protein, UA: NEGATIVE
Spec Grav, UA: 1.02
UROBILINOGEN UA: 0.2

## 2013-09-06 LAB — CBC WITH DIFFERENTIAL/PLATELET
BASOS PCT: 0.5 % (ref 0.0–3.0)
Basophils Absolute: 0 10*3/uL (ref 0.0–0.1)
EOS ABS: 0.3 10*3/uL (ref 0.0–0.7)
Eosinophils Relative: 3.2 % (ref 0.0–5.0)
HCT: 42.6 % (ref 39.0–52.0)
Hemoglobin: 14.6 g/dL (ref 13.0–17.0)
LYMPHS ABS: 2.8 10*3/uL (ref 0.7–4.0)
Lymphocytes Relative: 34.1 % (ref 12.0–46.0)
MCHC: 34.2 g/dL (ref 30.0–36.0)
MCV: 87.5 fl (ref 78.0–100.0)
MONO ABS: 0.7 10*3/uL (ref 0.1–1.0)
Monocytes Relative: 8.2 % (ref 3.0–12.0)
NEUTROS PCT: 54 % (ref 43.0–77.0)
Neutro Abs: 4.4 10*3/uL (ref 1.4–7.7)
PLATELETS: 333 10*3/uL (ref 150.0–400.0)
RBC: 4.86 Mil/uL (ref 4.22–5.81)
RDW: 13 % (ref 11.5–15.5)
WBC: 8.2 10*3/uL (ref 4.0–10.5)

## 2013-09-06 LAB — LIPID PANEL
CHOL/HDL RATIO: 5
Cholesterol: 193 mg/dL (ref 0–200)
HDL: 35.5 mg/dL — ABNORMAL LOW (ref >39.00)
LDL CALC: 144 mg/dL — AB (ref 0–99)
NONHDL: 157.5
Triglycerides: 67 mg/dL (ref 0.0–149.0)
VLDL: 13.4 mg/dL (ref 0.0–40.0)

## 2013-09-06 LAB — TSH: TSH: 0.74 u[IU]/mL (ref 0.35–4.50)

## 2013-09-06 LAB — PSA: PSA: 0.51 ng/mL (ref 0.10–4.00)

## 2013-09-13 ENCOUNTER — Encounter: Payer: 59 | Admitting: Family Medicine

## 2013-09-17 ENCOUNTER — Other Ambulatory Visit: Payer: Self-pay | Admitting: Family Medicine

## 2013-09-17 ENCOUNTER — Ambulatory Visit (INDEPENDENT_AMBULATORY_CARE_PROVIDER_SITE_OTHER): Payer: 59 | Admitting: Family Medicine

## 2013-09-17 ENCOUNTER — Encounter: Payer: Self-pay | Admitting: Family Medicine

## 2013-09-17 VITALS — BP 110/80 | Temp 99.2°F | Ht 67.25 in | Wt 218.0 lb

## 2013-09-17 DIAGNOSIS — J309 Allergic rhinitis, unspecified: Secondary | ICD-10-CM

## 2013-09-17 DIAGNOSIS — F3289 Other specified depressive episodes: Secondary | ICD-10-CM

## 2013-09-17 DIAGNOSIS — K219 Gastro-esophageal reflux disease without esophagitis: Secondary | ICD-10-CM

## 2013-09-17 DIAGNOSIS — F329 Major depressive disorder, single episode, unspecified: Secondary | ICD-10-CM

## 2013-09-17 DIAGNOSIS — Z23 Encounter for immunization: Secondary | ICD-10-CM

## 2013-09-17 DIAGNOSIS — F32A Depression, unspecified: Secondary | ICD-10-CM

## 2013-09-17 DIAGNOSIS — E785 Hyperlipidemia, unspecified: Secondary | ICD-10-CM

## 2013-09-17 MED ORDER — FLUTICASONE PROPIONATE 50 MCG/ACT NA SUSP: 2.0000 | Freq: Every day | NASAL | Status: DC

## 2013-09-17 MED ORDER — CITALOPRAM HYDROBROMIDE 20 MG PO TABS: 20.0000 mg | ORAL_TABLET | Freq: Every day | ORAL | Status: DC

## 2013-09-17 NOTE — Patient Instructions (Signed)
Apply DuoFilm nightly......... shave weekly  Return for cryotherapy if you don't see any improvement in 4-6 weeks  [facility] dermatology.........Marland Kitchen Dr. Wilhemina Bonito  Continue diet and exercise program  Return in 2 months for fasting lipid panel  Hold the Zocor  Switch from Prilosec to Zantac  Skin protection as outlined

## 2013-09-17 NOTE — Progress Notes (Signed)
Pre visit review using our clinic review tool, if applicable. No additional management support is needed unless otherwise documented below in the visit note. 

## 2013-09-17 NOTE — Progress Notes (Signed)
   Subjective:    Patient ID: Connor Thomas, male    DOB: Mar 09, 1960, 53 y.o.   MRN: [id number]  HPI Connor Thomas is a 53 year old married male nonsmoker who comes in today for general physical examination  He takes Zyrtec a steroid nasal spray for allergic rhinitis  He takes Celexa 20 mg daily at bedtime for history of mild depression  He takes over-the-counter Prilosec for reflux however his reflux symptoms have diminished and she's gone on a diet. He's lost 10 pounds. He would also like to know if he can get away with not taking the Zocor. He's not having side effects uses once a andexercisewillresolvehislipidissue  LastNovember[facility]  Hegetsroutineeyecare,dentalcare,colonoscopyatage53normal.  He has had a history of asthma secondary to viral syndromes. This year he has not had to use his inhaler  Vaccinations updated by Apolonio Schneiders   Review of Systems  Constitutional: Negative.   HENT: Negative.   Eyes: Negative.   Respiratory: Negative.   Cardiovascular: Negative.   Gastrointestinal: Negative.   Genitourinary: Negative.   Musculoskeletal: Negative.   Skin: Negative.   Neurological: Negative.   Psychiatric/Behavioral: Negative.        Objective:   Physical Exam  Nursing note and vitals reviewed. Constitutional: He is oriented to person, place, and time. He appears well-developed and well-nourished.  HENT:  Head: Normocephalic and atraumatic.  Right Ear: External ear normal.  Left Ear: External ear normal.  Nose: Nose normal.  Mouth/Throat: Oropharynx is clear and moist.  Eyes: Conjunctivae and EOM are normal. Pupils are equal, round, and reactive to light.  Neck: Normal range of motion. Neck supple. No JVD present. No tracheal deviation present. No thyromegaly present.  Cardiovascular: Normal rate, regular rhythm, normal heart sounds and intact distal pulses.  Exam reveals no gallop and no friction rub.   No murmur heard. No  carotid aortic bruits peripheral pulses 2+ and symmetrical  Pulmonary/Chest: Effort normal and breath sounds normal. No stridor. No respiratory distress. He has no wheezes. He has no rales. He exhibits no tenderness.  Abdominal: Soft. Bowel sounds are normal. He exhibits no distension and no mass. There is no tenderness. There is no rebound and no guarding.  Genitourinary: Rectum normal, prostate normal and penis normal. Guaiac negative stool. No penile tenderness.  Musculoskeletal: Normal range of motion. He exhibits no edema and no tenderness.  Lymphadenopathy:    He has no cervical adenopathy.  Neurological: He is alert and oriented to person, place, and time. He has normal reflexes. No cranial nerve deficit. He exhibits normal muscle tone.  Skin: Skin is warm and dry. No rash noted. No erythema. No pallor.  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 he spent a lot of his she is in [place].  Psychiatric: He has a normal mood and affect. His behavior is normal. Judgment and thought content normal.          Assessment & Plan:  Healthy male  Hyperlipidemia........ continue diet exercise and weight loss........ hold statin...Marland KitchenMarland KitchenMarland Kitchen followup lipid panel in 2 months  Allergic rhinitis continue Zyrtec and steroid nasal spray  Reflux esophagitis...Marland KitchenMarland Kitchen switch to Zantac  History of asthma secondary to viral syndromes........ asymptomatic

## 2013-10-13 ENCOUNTER — Telehealth: Payer: Self-pay | Admitting: Family Medicine

## 2013-10-13 NOTE — Telephone Encounter (Signed)
Pt needs re-fill on omeprazole (PRILOSEC) 20 MG capsule [facility] - [place], [place] - [id number]

## 2013-10-14 MED ORDER — OMEPRAZOLE 20 MG PO CPDR: 20.0000 mg | DELAYED_RELEASE_CAPSULE | Freq: Every day | ORAL | Status: DC

## 2013-11-17 ENCOUNTER — Other Ambulatory Visit (INDEPENDENT_AMBULATORY_CARE_PROVIDER_SITE_OTHER): Payer: 59

## 2013-11-17 DIAGNOSIS — E785 Hyperlipidemia, unspecified: Secondary | ICD-10-CM

## 2013-11-17 LAB — LIPID PANEL
Cholesterol: 213 mg/dL — ABNORMAL HIGH (ref 0–200)
HDL: 40.6 mg/dL
LDL Cholesterol: 149 mg/dL — ABNORMAL HIGH (ref 0–99)
NonHDL: 172.4
Total CHOL/HDL Ratio: 5
Triglycerides: 116 mg/dL (ref 0.0–149.0)
VLDL: 23.2 mg/dL (ref 0.0–40.0)

## 2013-11-18 ENCOUNTER — Other Ambulatory Visit: Payer: Self-pay | Admitting: *Deleted

## 2013-11-18 MED ORDER — RANITIDINE HCL 300 MG PO TABS: 300.0000 mg | ORAL_TABLET | Freq: Every day | ORAL | Status: DC

## 2014-01-16 ENCOUNTER — Other Ambulatory Visit: Payer: Self-pay | Admitting: Family Medicine

## 2014-03-29 ENCOUNTER — Telehealth: Payer: Self-pay | Admitting: Family Medicine

## 2014-03-29 NOTE — Telephone Encounter (Signed)
Left message on machine for patient to call back and schedule an appointment tomorrow.  Okay to work in.Marland Kitchen

## 2014-03-29 NOTE — Telephone Encounter (Signed)
Pt has sinus inf and ear inf. Can I work pt in today?

## 2014-04-24 ENCOUNTER — Other Ambulatory Visit: Payer: Self-pay | Admitting: Family Medicine

## 2014-09-29 ENCOUNTER — Other Ambulatory Visit: Payer: Self-pay | Admitting: Family Medicine

## 2014-10-04 ENCOUNTER — Other Ambulatory Visit: Payer: Self-pay

## 2014-10-06 ENCOUNTER — Other Ambulatory Visit (INDEPENDENT_AMBULATORY_CARE_PROVIDER_SITE_OTHER): Payer: 59

## 2014-10-06 DIAGNOSIS — Z0000 Encounter for general adult medical examination without abnormal findings: Secondary | ICD-10-CM | POA: Diagnosis not present

## 2014-10-06 LAB — CBC WITH DIFFERENTIAL/PLATELET
BASOS PCT: 0.6 % (ref 0.0–3.0)
Basophils Absolute: 0.1 10*3/uL (ref 0.0–0.1)
EOS ABS: 0.4 10*3/uL (ref 0.0–0.7)
Eosinophils Relative: 4.7 % (ref 0.0–5.0)
HEMATOCRIT: 41.2 % (ref 39.0–52.0)
Hemoglobin: 14.1 g/dL (ref 13.0–17.0)
LYMPHS PCT: 32.7 % (ref 12.0–46.0)
Lymphs Abs: 3 10*3/uL (ref 0.7–4.0)
MCHC: 34.2 g/dL (ref 30.0–36.0)
MCV: 89.1 fl (ref 78.0–100.0)
Monocytes Absolute: 0.7 10*3/uL (ref 0.1–1.0)
Monocytes Relative: 7.3 % (ref 3.0–12.0)
NEUTROS ABS: 5.1 10*3/uL (ref 1.4–7.7)
Neutrophils Relative %: 54.7 % (ref 43.0–77.0)
PLATELETS: 328 10*3/uL (ref 150.0–400.0)
RBC: 4.63 Mil/uL (ref 4.22–5.81)
RDW: 12.8 % (ref 11.5–15.5)
WBC: 9.3 10*3/uL (ref 4.0–10.5)

## 2014-10-06 LAB — HEPATIC FUNCTION PANEL
ALBUMIN: 4.2 g/dL (ref 3.5–5.2)
ALT: 28 U/L (ref 0–53)
AST: 21 U/L (ref 0–37)
Alkaline Phosphatase: 78 U/L (ref 39–117)
BILIRUBIN DIRECT: 0.2 mg/dL (ref 0.0–0.3)
TOTAL PROTEIN: 6.7 g/dL (ref 6.0–8.3)
Total Bilirubin: 0.7 mg/dL (ref 0.2–1.2)

## 2014-10-06 LAB — BASIC METABOLIC PANEL
BUN: 16 mg/dL (ref 6–23)
CHLORIDE: 103 meq/L (ref 96–112)
CO2: 29 meq/L (ref 19–32)
CREATININE: 0.92 mg/dL (ref 0.40–1.50)
Calcium: 9.4 mg/dL (ref 8.4–10.5)
GFR: 90.99 mL/min (ref >60.00)
Glucose, Bld: 112 mg/dL — ABNORMAL HIGH (ref 70–99)
Potassium: 4.3 mEq/L (ref 3.5–5.1)
Sodium: 141 mEq/L (ref 135–145)

## 2014-10-06 LAB — LIPID PANEL
CHOL/HDL RATIO: 4
Cholesterol: 182 mg/dL (ref 0–200)
HDL: 47.6 mg/dL (ref >39.00)
LDL Cholesterol: 102 mg/dL — ABNORMAL HIGH (ref 0–99)
NONHDL: 134.89
Triglycerides: 165 mg/dL — ABNORMAL HIGH (ref 0.0–149.0)
VLDL: 33 mg/dL (ref 0.0–40.0)

## 2014-10-06 LAB — TSH: TSH: 1.28 u[IU]/mL (ref 0.35–4.50)

## 2014-10-06 LAB — POCT URINALYSIS DIPSTICK
Bilirubin, UA: NEGATIVE
Glucose, UA: NEGATIVE
KETONES UA: NEGATIVE
Leukocytes, UA: NEGATIVE
Nitrite, UA: NEGATIVE
PH UA: 5
PROTEIN UA: NEGATIVE
Urobilinogen, UA: 0.2

## 2014-10-06 LAB — PSA: PSA: 0.42 ng/mL (ref 0.10–4.00)

## 2014-10-10 ENCOUNTER — Other Ambulatory Visit: Payer: Self-pay

## 2014-10-17 ENCOUNTER — Ambulatory Visit (INDEPENDENT_AMBULATORY_CARE_PROVIDER_SITE_OTHER): Payer: 59 | Admitting: Family Medicine

## 2014-10-17 ENCOUNTER — Encounter: Payer: Self-pay | Admitting: Family Medicine

## 2014-10-17 VITALS — BP 115/70 | HR 73 | Temp 98.0°F | Ht 67.2 in | Wt 233.0 lb

## 2014-10-17 DIAGNOSIS — R739 Hyperglycemia, unspecified: Secondary | ICD-10-CM | POA: Insufficient documentation

## 2014-10-17 DIAGNOSIS — Z23 Encounter for immunization: Secondary | ICD-10-CM

## 2014-10-17 DIAGNOSIS — R319 Hematuria, unspecified: Secondary | ICD-10-CM

## 2014-10-17 DIAGNOSIS — F329 Major depressive disorder, single episode, unspecified: Secondary | ICD-10-CM

## 2014-10-17 DIAGNOSIS — E785 Hyperlipidemia, unspecified: Secondary | ICD-10-CM

## 2014-10-17 DIAGNOSIS — J302 Other seasonal allergic rhinitis: Secondary | ICD-10-CM

## 2014-10-17 DIAGNOSIS — F32A Depression, unspecified: Secondary | ICD-10-CM

## 2014-10-17 DIAGNOSIS — K219 Gastro-esophageal reflux disease without esophagitis: Secondary | ICD-10-CM

## 2014-10-17 DIAGNOSIS — Z0000 Encounter for general adult medical examination without abnormal findings: Secondary | ICD-10-CM

## 2014-10-17 DIAGNOSIS — R7309 Other abnormal glucose: Secondary | ICD-10-CM | POA: Diagnosis not present

## 2014-10-17 HISTORY — DX: Hematuria, unspecified: R31.9

## 2014-10-17 LAB — POCT URINALYSIS DIPSTICK
Bilirubin, UA: NEGATIVE
Glucose, UA: NEGATIVE
Ketones, UA: NEGATIVE
LEUKOCYTES UA: NEGATIVE
NITRITE UA: NEGATIVE
PH UA: 5.5
PROTEIN UA: NEGATIVE
RBC UA: NEGATIVE
Spec Grav, UA: 1.02
UROBILINOGEN UA: 0.2

## 2014-10-17 LAB — HEMOGLOBIN A1C: HEMOGLOBIN A1C: 5.7 % (ref 4.6–6.5)

## 2014-10-17 MED ORDER — OMEPRAZOLE 20 MG PO CPDR: 20.0000 mg | DELAYED_RELEASE_CAPSULE | Freq: Every day | ORAL | Status: DC

## 2014-10-17 MED ORDER — FLUTICASONE PROPIONATE 50 MCG/ACT NA SUSP: 2.0000 | Freq: Every day | NASAL | Status: DC

## 2014-10-17 MED ORDER — SIMVASTATIN 20 MG PO TABS: 20.0000 mg | ORAL_TABLET | Freq: Every day | ORAL | Status: DC

## 2014-10-17 MED ORDER — SILDENAFIL CITRATE 20 MG PO TABS: 20.0000 mg | ORAL_TABLET | Freq: Three times a day (TID) | ORAL | Status: DC

## 2014-10-17 MED ORDER — CITALOPRAM HYDROBROMIDE 20 MG PO TABS: 20.0000 mg | ORAL_TABLET | Freq: Every day | ORAL | Status: DC

## 2014-10-17 NOTE — Progress Notes (Signed)
Subjective:    Patient ID: Connor Thomas, male    DOB: 1960-02-19, 54 y.o.   MRN: [id number]  HPI Keygan is a 54 year old married male nonsmoker who comes in today for annual evaluation because a history of allergic rhinitis, mild depression, hyperlipidemia, reflux esophagitis.  He takes Prilosec 20 mg for chronic reflux esophagitis and is asymptomatic  He takes Zocor 20 mg daily and an aspirin tablet because of a history of hyperlipidemia lipids are at goal  He takes Celexa 20 mg daily for history of mild depression  He takes Zyrtec and Flonase for allergic rhinitis  His weight is 233 pounds. Height 67 inches. Fasting blood sugar 112. Will check A1c  He gets routine eye care, dental care, colonoscopy 10 years ago was normal. Due for follow-up  Says he feels well except he has difficulty with his right shoulder. And sore for couple years. 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 Does not recall any specific trauma.  He had an umbilical hernia repair many years ago now is goal defect on top his umbilical hernia he would like to know what that is. Flu shot  Flu shot given today  He does not exercise on a regular basis   Review of Systems  Constitutional: Negative.   HENT: Negative.   Eyes: Negative.   Respiratory: Negative.   Cardiovascular: Negative.   Gastrointestinal: Negative.   Endocrine: Negative.   Genitourinary: Negative.   Musculoskeletal: Negative.   Skin: Negative.   Allergic/Immunologic: Negative.   Neurological: Negative.   Hematological: Negative.   Psychiatric/Behavioral: Negative.        Objective:   Physical Exam  Constitutional: He is oriented to person, place, and time. He appears well-developed and well-nourished.  HENT:  Head: Normocephalic and atraumatic.  Right Ear: External ear normal.  Left Ear: External ear normal.  Nose: Nose normal.  Mouth/Throat: Oropharynx is clear and moist.  Eyes: Conjunctivae and EOM are normal. Pupils are equal,  round, and reactive to light.  Neck: Normal range of motion. Neck supple. No JVD present. No tracheal deviation present. No thyromegaly present.  Cardiovascular: Normal rate, regular rhythm, normal heart sounds and intact distal pulses.  Exam reveals no gallop and no friction rub.   No murmur heard. Pulmonary/Chest: Effort normal and breath sounds normal. No stridor. No respiratory distress. He has no wheezes. He has no rales. He exhibits no tenderness.  Abdominal: Soft. Bowel sounds are normal. He exhibits no distension and no mass. There is no tenderness. There is no rebound and no guarding.  2 inches of ventral hernia above the umbilicus  Genitourinary: Rectum normal, prostate normal and penis normal. Guaiac negative stool. No penile tenderness.  Musculoskeletal: Normal range of motion. He exhibits no edema or tenderness.  Right shoulder shows full range of motion no tenderness no palpable abnormalities  Lymphadenopathy:    He has no cervical adenopathy.  Neurological: He is alert and oriented to person, place, and time. He has normal reflexes. No cranial nerve deficit. He exhibits normal muscle tone.  Skin: Skin is warm and dry. No rash noted. No erythema. No pallor.  Total body skin exam normal  Psychiatric: He has a normal mood and affect. His behavior is normal. Judgment and thought content normal.  Nursing note and vitals reviewed.         Assessment & Plan:  Healthy male  Obesity,,,,,,,,,, now with elevated blood sugar,,,,,,, check A1c,,,,,,,,,,,, diet and exercise program outlined,,,,, follow-up in one month  Hyperlipidemia,,,,,,,,  continue Zocor  Allergic rhinitis,,,, continue Zyrtec and steroid nasal spray  History of mild depression,,,,,,, continue Celexa  Right shoulder pain,,,,,,,, Motrin 400 twice a day with food daily exercises  Umbilical hernia,,,,,,,, diet exercise weight loss abdominal support when necessary,  Asymptomatic hematuria 1+,,,,,,,, no history of  hematuria kidney stones etc.,,,,,, repeat UA. In reviewing his lab work he did have 1+ hematuria last year also

## 2014-10-17 NOTE — Patient Instructions (Signed)
Begin a carbohydrate free diet........ walk 30 minutes daily  Follow-up in one month  Right shoulder exercises........Marland Kitchen 10 minutes twice daily with a 5 pound weight.......Marland Kitchen range of motion...Marland KitchenMarland KitchenMarland Kitchen Motrin 400 mg twice daily with food  Continue other medications  We will also get you set up to go to GI to discuss follow-up screening colonoscopy

## 2014-10-17 NOTE — Progress Notes (Signed)
Pre visit review using our clinic review tool, if applicable. No additional management support is needed unless otherwise documented below in the visit note. 

## 2014-10-19 NOTE — Progress Notes (Signed)
   Subjective:    Patient ID: Connor Thomas, male    DOB: Jan 19, 1961, 54 y.o.   MRN: [id number]  HPI    Review of Systems     Objective:   Physical Exam        Assessment & Plan:  EKG was done and was interpreted as normal by Dr Stevie Kern

## 2014-11-14 ENCOUNTER — Ambulatory Visit: Payer: 59 | Admitting: Family Medicine

## 2015-02-06 ENCOUNTER — Encounter: Payer: Self-pay | Admitting: Gastroenterology

## 2015-04-14 ENCOUNTER — Encounter: Payer: Self-pay | Admitting: Internal Medicine

## 2015-04-14 ENCOUNTER — Ambulatory Visit (INDEPENDENT_AMBULATORY_CARE_PROVIDER_SITE_OTHER): Payer: 59 | Admitting: Internal Medicine

## 2015-04-14 VITALS — BP 106/76 | HR 71 | Temp 98.5°F | Wt 236.0 lb

## 2015-04-14 DIAGNOSIS — Z20828 Contact with and (suspected) exposure to other viral communicable diseases: Secondary | ICD-10-CM

## 2015-04-14 DIAGNOSIS — J302 Other seasonal allergic rhinitis: Secondary | ICD-10-CM

## 2015-04-14 DIAGNOSIS — J111 Influenza due to unidentified influenza virus with other respiratory manifestations: Secondary | ICD-10-CM | POA: Diagnosis not present

## 2015-04-14 DIAGNOSIS — J209 Acute bronchitis, unspecified: Secondary | ICD-10-CM | POA: Diagnosis not present

## 2015-04-14 DIAGNOSIS — R69 Illness, unspecified: Principal | ICD-10-CM

## 2015-04-14 MED ORDER — ALBUTEROL SULFATE HFA 108 (90 BASE) MCG/ACT IN AERS: 2.0000 | INHALATION_SPRAY | Freq: Four times a day (QID) | RESPIRATORY_TRACT | Status: DC | PRN

## 2015-04-14 MED ORDER — HYDROCODONE-HOMATROPINE 5-1.5 MG/5ML PO SYRP: ORAL_SOLUTION | ORAL | Status: DC

## 2015-04-14 MED ORDER — OSELTAMIVIR PHOSPHATE 75 MG PO CAPS: 75.0000 mg | ORAL_CAPSULE | Freq: Two times a day (BID) | ORAL | Status: DC

## 2015-04-14 NOTE — Patient Instructions (Signed)
This acts like flu . Cough med  inhaler for wehezing  If getting fever or not getting better next week conact team   Signs of pneumonia   Getting fever  Or worsening of sx .       Influenza, Adult Influenza ("the flu") is a viral infection of the respiratory tract. It occurs more often in winter months because people spend more time in close contact with one another. Influenza can make you feel very sick. Influenza easily spreads from person to person (contagious). CAUSES  Influenza is caused by a virus that infects the respiratory tract. You can catch the virus by breathing in droplets from an infected person's cough or sneeze. You can also catch the virus by touching something that was recently contaminated with the virus and then touching your mouth, nose, or eyes. RISKS AND COMPLICATIONS You may be at risk for a more severe case of influenza if you smoke cigarettes, have diabetes, have chronic heart disease (such as heart failure) or lung disease (such as asthma), or if you have a weakened immune system. Elderly people and pregnant women are also at risk for more serious infections. The most common problem of influenza is a lung infection (pneumonia). Sometimes, this problem can require emergency medical care and may be life threatening. SIGNS AND SYMPTOMS  Symptoms typically last 4 to 10 days and may include:  Fever.  Chills.  Headache, body aches, and muscle aches.  Sore throat.  Chest discomfort and cough.  Poor appetite.  Weakness or feeling tired.  Dizziness.  Nausea or vomiting. DIAGNOSIS  Diagnosis of influenza is often made based on your history and a physical exam. A nose or throat swab test can be done to confirm the diagnosis. TREATMENT  In mild cases, influenza goes away on its own. Treatment is directed at relieving symptoms. For more severe cases, your health care provider may prescribe antiviral medicines to shorten the sickness. Antibiotic medicines are not  effective because the infection is caused by a virus, not by bacteria. HOME CARE INSTRUCTIONS  Take medicines only as directed by your health care provider.  Use a cool mist humidifier to make breathing easier.  Get plenty of rest until your temperature returns to normal. This usually takes 3 to 4 days.  Drink enough fluid to keep your urine clear or pale yellow.  Cover yourmouth and nosewhen coughing or sneezing,and wash your handswellto prevent thevirusfrom spreading.  Stay homefromwork orschool untilthe fever is gonefor at least 53full day. PREVENTION  An annual influenza vaccination (flu shot) is the best way to avoid getting influenza. An annual flu shot is now routinely recommended for all adults in the [place] IF:  You experiencechest pain, yourcough worsens,or you producemore mucus.  Youhave nausea,vomiting, ordiarrhea.  Your fever returns or gets worse. SEEK IMMEDIATE MEDICAL CARE IF:  You havetrouble breathing, you become short of breath,or your skin ornails becomebluish.  You have severe painor stiffnessin the neck.  You develop a sudden headache, or pain in the face or ear.  You have nausea or vomiting that you cannot control. MAKE SURE YOU:   Understand these instructions.  Will watch your condition.  Will get help right away if you are not doing well or get worse.   This information is not intended to replace advice given to you by your health care provider. Make sure you discuss any questions you have with your health care provider.   Document Released: 01/05/2000 Document Revised: 01/28/2014 Document  Reviewed: 04/08/2011 Elsevier Interactive Patient Education 2016 Elsevier Inc.   Acute Bronchitis Bronchitis is inflammation of the airways that extend from the windpipe into the lungs (bronchi). The inflammation often causes mucus to develop. This leads to a cough, which is the most common symptom of bronchitis.  In  acute bronchitis, the condition usually develops suddenly and goes away over time, usually in a couple weeks. Smoking, allergies, and asthma can make bronchitis worse. Repeated episodes of bronchitis may cause further lung problems.  CAUSES Acute bronchitis is most often caused by the same virus that causes a cold. The virus can spread from person to person (contagious) through coughing, sneezing, and touching contaminated objects. SIGNS AND SYMPTOMS   Cough.   Fever.   Coughing up mucus.   Body aches.   Chest congestion.   Chills.   Shortness of breath.   Sore throat.  DIAGNOSIS  Acute bronchitis is usually diagnosed through a physical exam. Your health care provider will also ask you questions about your medical history. Tests, such as chest X-rays, are sometimes done to rule out other conditions.  TREATMENT  Acute bronchitis usually goes away in a couple weeks. Oftentimes, no medical treatment is necessary. Medicines are sometimes given for relief of fever or cough. Antibiotic medicines are usually not needed but may be prescribed in certain situations. In some cases, an inhaler may be recommended to help reduce shortness of breath and control the cough. A cool mist vaporizer may also be used to help thin bronchial secretions and make it easier to clear the chest.  HOME CARE INSTRUCTIONS  Get plenty of rest.   Drink enough fluids to keep your urine clear or pale yellow (unless you have a medical condition that requires fluid restriction). Increasing fluids may help thin your respiratory secretions (sputum) and reduce chest congestion, and it will prevent dehydration.   Take medicines only as directed by your health care provider.  If you were prescribed an antibiotic medicine, finish it all even if you start to feel better.  Avoid smoking and secondhand smoke. Exposure to cigarette smoke or irritating chemicals will make bronchitis worse. If you are a smoker, consider  using nicotine gum or skin patches to help control withdrawal symptoms. Quitting smoking will help your lungs heal faster.   Reduce the chances of another bout of acute bronchitis by washing your hands frequently, avoiding people with cold symptoms, and trying not to touch your hands to your mouth, nose, or eyes.   Keep all follow-up visits as directed by your health care provider.  SEEK MEDICAL CARE IF: Your symptoms do not improve after 1 week of treatment.  SEEK IMMEDIATE MEDICAL CARE IF:  You develop an increased fever or chills.   You have chest pain.   You have severe shortness of breath.  You have bloody sputum.   You develop dehydration.  You faint or repeatedly feel like you are going to pass out.  You develop repeated vomiting.  You develop a severe headache. MAKE SURE YOU:   Understand these instructions.  Will watch your condition.  Will get help right away if you are not doing well or get worse.   This information is not intended to replace advice given to you by your health care provider. Make sure you discuss any questions you have with your health care provider.   Document Released: 02/15/2004 Document Revised: 01/28/2014 Document Reviewed: 06/30/2012 Elsevier Interactive Patient Education [employer].

## 2015-04-14 NOTE — Progress Notes (Signed)
Pre visit review using our clinic review tool, if applicable. No additional management support is needed unless otherwise documented below in the visit note.  Chief Complaint  Patient presents with  . Cough    chest cong exposed to flu    HPI: Connor Thomas 55 y.o. here for sda acute visit  PCP NA onset 2+ days of  Cough low grade fever  Cough  Chest congestion took sons tamiflu cause he was feeling  better  . Green phegm in am and   Clear in day   Took sons tamiflu one yest and one day before no fever now .  Inhaler last night helped but old.  Chest feels congested but better after moving around clear in day green in am no hemoptysis  Inhaler old helped some  Hx asthma as a child; allergy in season ROS: See pertinent positives and negatives per HPI.  Past Medical History  Diagnosis Date  . Allergy   . Ganglion of right wrist   . GERD (gastroesophageal reflux disease)   . Hyperlipidemia   . Cancer   . 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       Social History   Social History  . Marital Status: Married    Spouse Name: N/A  . Number of Children: N/A  . Years of Education: N/A   Social History Main Topics  . Smoking status: Former Smoker    Types: Cigarettes    Quit date: 04/30/2005  . Smokeless tobacco: Not on file  . Alcohol Use: Yes  . Drug Use: No  . Sexual Activity: Not on file   Other Topics Concern  . Not on file   Social History Narrative    Outpatient Prescriptions Prior to Visit  Medication Sig Dispense Refill  . aspirin 81 MG tablet Take 81 mg by mouth daily.      . cetirizine (ZYRTEC) 10 MG tablet Take 10 mg by mouth daily.      . citalopram (CELEXA) 20 MG tablet TAKE ONE TABLET BY MOUTH ONCE DAILY 30 tablet 0  . citalopram (CELEXA) 20 MG tablet Take 1 tablet (20 mg total) by mouth daily. 100 tablet 3  . fluticasone (FLONASE) 50 MCG/ACT nasal spray Place 2 sprays into both nostrils daily. 16 g 11  .  omeprazole (PRILOSEC) 20 MG capsule Take 1 capsule (20 mg total) by mouth daily. 100 capsule 3  . sildenafil (REVATIO) 20 MG tablet Take 1 tablet (20 mg total) by mouth 3 (three) times daily. 20 tablet 10  . simvastatin (ZOCOR) 20 MG tablet Take 1 tablet (20 mg total) by mouth at bedtime. 100 tablet 3  . albuterol (PROVENTIL HFA) 108 (90 BASE) MCG/ACT inhaler Inhale 1 puff into the lungs 4 (four) times daily. 2 ps quid as needed 18 g 3   No facility-administered medications prior to visit.     EXAM:  BP 106/76 mmHg  Pulse 71  Temp(Src) 98.5 F (36.9 C) (Oral)  Wt 236 lb (107.049 kg)  SpO2 96%  Body mass index is 36.74 kg/(m^2). WDWN in NAD  quiet respirations; mildly congested bronchitic cough    somewhat hoarse. Non toxic . HEENT: Normocephalic ;atraumatic , Eyes;  PERRL, EOMs  Full, lids and conjunctiva clear,,Ears: no deformities, canals nl, TM landmarks normal, Nose: no deformity or discharge but congested;face minimally tender Mouth : OP clear without lesion or edema . Neck: Supple without adenopathy  or masses or bruits Chest:  Clear to A without wheezes rales or rhonchi initial bronchial  sounds clear with cough right base   CV:  S1-S2 no gallops or murmurs peripheral perfusion is normal Skin :nl perfusion and no acute rashes  PSYCH: pleasant and cooperative, no obvious depression or anxiety  ASSESSMENT AND PLAN:  Discussed the following assessment and plan:  Acute bronchitis, unspecified organism  Influenza-like illness  Exposure to the flu  Other seasonal allergic rhinitis Has had partial self rx with tamiflu test not that helpful    Risk benefit of medication discussed. And alarm sx of pna etc   Supportive care and    b agonist  Cough med for now  With caution  -Patient advised to return or notify health care team  if symptoms worsen ,persist or new concerns arise.  Patient Instructions  This acts like flu . Cough med  inhaler for wehezing  If getting fever or  not getting better next week conact team   Signs of pneumonia   Getting fever  Or worsening of sx .       Influenza, Adult Influenza ("the flu") is a viral infection of the respiratory tract. It occurs more often in winter months because people spend more time in close contact with one another. Influenza can make you feel very sick. Influenza easily spreads from person to person (contagious). CAUSES  Influenza is caused by a virus that infects the respiratory tract. You can catch the virus by breathing in droplets from an infected person's cough or sneeze. You can also catch the virus by touching something that was recently contaminated with the virus and then touching your mouth, nose, or eyes. RISKS AND COMPLICATIONS You may be at risk for a more severe case of influenza if you smoke cigarettes, have diabetes, have chronic heart disease (such as heart failure) or lung disease (such as asthma), or if you have a weakened immune system. Elderly people and pregnant women are also at risk for more serious infections. The most common problem of influenza is a lung infection (pneumonia). Sometimes, this problem can require emergency medical care and may be life threatening. SIGNS AND SYMPTOMS  Symptoms typically last 4 to 10 days and may include:  Fever.  Chills.  Headache, body aches, and muscle aches.  Sore throat.  Chest discomfort and cough.  Poor appetite.  Weakness or feeling tired.  Dizziness.  Nausea or vomiting. DIAGNOSIS  Diagnosis of influenza is often made based on your history and a physical exam. A nose or throat swab test can be done to confirm the diagnosis. TREATMENT  In mild cases, influenza goes away on its own. Treatment is directed at relieving symptoms. For more severe cases, your health care provider may prescribe antiviral medicines to shorten the sickness. Antibiotic medicines are not effective because the infection is caused by a virus, not by bacteria. HOME  CARE INSTRUCTIONS  Take medicines only as directed by your health care provider.  Use a cool mist humidifier to make breathing easier.  Get plenty of rest until your temperature returns to normal. This usually takes 3 to 4 days.  Drink enough fluid to keep your urine clear or pale yellow.  Cover yourmouth and nosewhen coughing or sneezing,and wash your handswellto prevent thevirusfrom spreading.  Stay homefromwork orschool untilthe fever is gonefor at least 58full day. PREVENTION  An annual influenza vaccination (flu shot) is the best way to avoid getting influenza. An annual flu shot is now  routinely recommended for all adults in the [place] IF:  You experiencechest pain, yourcough worsens,or you producemore mucus.  Youhave nausea,vomiting, ordiarrhea.  Your fever returns or gets worse. SEEK IMMEDIATE MEDICAL CARE IF:  You havetrouble breathing, you become short of breath,or your skin ornails becomebluish.  You have severe painor stiffnessin the neck.  You develop a sudden headache, or pain in the face or ear.  You have nausea or vomiting that you cannot control. MAKE SURE YOU:   Understand these instructions.  Will watch your condition.  Will get help right away if you are not doing well or get worse.   This information is not intended to replace advice given to you by your health care provider. Make sure you discuss any questions you have with your health care provider.   Document Released: 01/05/2000 Document Revised: 01/28/2014 Document Reviewed: 04/08/2011 Elsevier Interactive Patient Education 2016 Elsevier Inc[employer]   Acute Bronchitis Bronchitis is inflammation of the airways that extend from the windpipe into the lungs (bronchi). The inflammation often causes mucus to develop. This leads to a cough, which is the most common symptom of bronchitis.  In acute bronchitis, the condition usually develops suddenly and goes away over  time, usually in a couple weeks. Smoking, allergies, and asthma can make bronchitis worse. Repeated episodes of bronchitis may cause further lung problems.  CAUSES Acute bronchitis is most often caused by the same virus that causes a cold. The virus can spread from person to person (contagious) through coughing, sneezing, and touching contaminated objects. SIGNS AND SYMPTOMS   Cough.   Fever.   Coughing up mucus.   Body aches.   Chest congestion.   Chills.   Shortness of breath.   Sore throat.  DIAGNOSIS  Acute bronchitis is usually diagnosed through a physical exam. Your health care provider will also ask you questions about your medical history. Tests, such as chest X-rays, are sometimes done to rule out other conditions.  TREATMENT  Acute bronchitis usually goes away in a couple weeks. Oftentimes, no medical treatment is necessary. Medicines are sometimes given for relief of fever or cough. Antibiotic medicines are usually not needed but may be prescribed in certain situations. In some cases, an inhaler may be recommended to help reduce shortness of breath and control the cough. A cool mist vaporizer may also be used to help thin bronchial secretions and make it easier to clear the chest.  HOME CARE INSTRUCTIONS  Get plenty of rest.   Drink enough fluids to keep your urine clear or pale yellow (unless you have a medical condition that requires fluid restriction). Increasing fluids may help thin your respiratory secretions (sputum) and reduce chest congestion, and it will prevent dehydration.   Take medicines only as directed by your health care provider.  If you were prescribed an antibiotic medicine, finish it all even if you start to feel better.  Avoid smoking and secondhand smoke. Exposure to cigarette smoke or irritating chemicals will make bronchitis worse. If you are a smoker, consider using nicotine gum or skin patches to help control withdrawal symptoms.  Quitting smoking will help your lungs heal faster.   Reduce the chances of another bout of acute bronchitis by washing your hands frequently, avoiding people with cold symptoms, and trying not to touch your hands to your mouth, nose, or eyes.   Keep all follow-up visits as directed by your health care provider.  SEEK MEDICAL CARE IF: Your symptoms do not improve after 1  week of treatment.  SEEK IMMEDIATE MEDICAL CARE IF:  You develop an increased fever or chills.   You have chest pain.   You have severe shortness of breath.  You have bloody sputum.   You develop dehydration.  You faint or repeatedly feel like you are going to pass out.  You develop repeated vomiting.  You develop a severe headache. MAKE SURE YOU:   Understand these instructions.  Will watch your condition.  Will get help right away if you are not doing well or get worse.   This information is not intended to replace advice given to you by your health care provider. Make sure you discuss any questions you have with your health care provider.   Document Released: 02/15/2004 Document Revised: 01/28/2014 Document Reviewed: 06/30/2012 Elsevier Interactive Patient Education 2016 [place]. Aryahna Spagna M.D.

## 2015-04-24 ENCOUNTER — Telehealth: Payer: Self-pay | Admitting: Family Medicine

## 2015-04-24 DIAGNOSIS — J302 Other seasonal allergic rhinitis: Secondary | ICD-10-CM

## 2015-04-24 MED ORDER — FLUTICASONE PROPIONATE 50 MCG/ACT NA SUSP: 2.0000 | Freq: Every day | NASAL | Status: DC

## 2015-04-24 NOTE — Telephone Encounter (Signed)
Pt needs new rx flonase  Ns send to [facility]

## 2015-06-12 ENCOUNTER — Ambulatory Visit (INDEPENDENT_AMBULATORY_CARE_PROVIDER_SITE_OTHER): Payer: 59 | Admitting: Family Medicine

## 2015-06-12 ENCOUNTER — Encounter: Payer: Self-pay | Admitting: Family Medicine

## 2015-06-12 VITALS — BP 160/90 | HR 88 | Temp 98.7°F | Wt 233.6 lb

## 2015-06-12 DIAGNOSIS — J209 Acute bronchitis, unspecified: Secondary | ICD-10-CM

## 2015-06-12 DIAGNOSIS — F17210 Nicotine dependence, cigarettes, uncomplicated: Secondary | ICD-10-CM

## 2015-06-12 MED ORDER — AZITHROMYCIN 250 MG PO TABS: ORAL_TABLET | ORAL | Status: DC

## 2015-06-12 MED ORDER — VARENICLINE TARTRATE 0.5 MG X 11 & 1 MG X 42 PO MISC: ORAL | Status: DC

## 2015-06-12 MED ORDER — VARENICLINE TARTRATE 1 MG PO TABS: 1.0000 mg | ORAL_TABLET | Freq: Two times a day (BID) | ORAL | Status: DC

## 2015-06-12 NOTE — Progress Notes (Signed)
   Subjective:    Patient ID: Connor Thomas, male    DOB: [birth year], 55 y.o.   MRN: [id number]  HPI Here for 2 things. First he asks for help to quit smoking. He quit once before by going cold [place], but now that is not working. He has tried nicotine patches to no avail. Also for the past week he has had chest tightness and is coughing up yellow sputum. No fever.    Review of Systems  Constitutional: Negative.   HENT: Positive for congestion, postnasal drip and sinus pressure. Negative for ear pain and sore throat.   Eyes: Negative.   Respiratory: Positive for cough and chest tightness.        Objective:   Physical Exam  Constitutional: He appears well-developed and well-nourished.  HENT:  Right Ear: External ear normal.  Left Ear: External ear normal.  Nose: Nose normal.  Mouth/Throat: Oropharynx is clear and moist.  Eyes: Conjunctivae are normal.  Neck: No thyromegaly present.  Pulmonary/Chest: Effort normal. No respiratory distress. He has no wheezes. He has no rales.  Scattered rhonchi   Lymphadenopathy:    He has no cervical adenopathy.          Assessment & Plan:  Treat te bronchitis with a Zpack. Use Chantix to quit smoking.  Laurey Morale, MD

## 2015-06-26 ENCOUNTER — Other Ambulatory Visit: Payer: 59

## 2015-07-03 ENCOUNTER — Encounter: Payer: 59 | Admitting: Family Medicine

## 2015-10-16 ENCOUNTER — Other Ambulatory Visit (INDEPENDENT_AMBULATORY_CARE_PROVIDER_SITE_OTHER): Payer: 59

## 2015-10-16 DIAGNOSIS — Z0000 Encounter for general adult medical examination without abnormal findings: Secondary | ICD-10-CM | POA: Diagnosis not present

## 2015-10-16 LAB — BASIC METABOLIC PANEL
BUN: 12 mg/dL (ref 6–23)
CHLORIDE: 103 meq/L (ref 96–112)
CO2: 30 meq/L (ref 19–32)
CREATININE: 0.97 mg/dL (ref 0.40–1.50)
Calcium: 9.2 mg/dL (ref 8.4–10.5)
GFR: 85.27 mL/min (ref >60.00)
GLUCOSE: 102 mg/dL — AB (ref 70–99)
POTASSIUM: 4.5 meq/L (ref 3.5–5.1)
Sodium: 141 mEq/L (ref 135–145)

## 2015-10-16 LAB — CBC WITH DIFFERENTIAL/PLATELET
BASOS PCT: 0.5 % (ref 0.0–3.0)
Basophils Absolute: 0 10*3/uL (ref 0.0–0.1)
EOS ABS: 0.4 10*3/uL (ref 0.0–0.7)
EOS PCT: 4 % (ref 0.0–5.0)
HCT: 43.3 % (ref 39.0–52.0)
HEMOGLOBIN: 14.9 g/dL (ref 13.0–17.0)
Lymphocytes Relative: 31.2 % (ref 12.0–46.0)
Lymphs Abs: 3 10*3/uL (ref 0.7–4.0)
MCHC: 34.5 g/dL (ref 30.0–36.0)
MCV: 88.6 fl (ref 78.0–100.0)
MONO ABS: 0.7 10*3/uL (ref 0.1–1.0)
Monocytes Relative: 7.4 % (ref 3.0–12.0)
NEUTROS ABS: 5.6 10*3/uL (ref 1.4–7.7)
Neutrophils Relative %: 56.9 % (ref 43.0–77.0)
PLATELETS: 330 10*3/uL (ref 150.0–400.0)
RBC: 4.89 Mil/uL (ref 4.22–5.81)
RDW: 13.1 % (ref 11.5–15.5)
WBC: 9.8 10*3/uL (ref 4.0–10.5)

## 2015-10-16 LAB — POC URINALSYSI DIPSTICK (AUTOMATED)
Bilirubin, UA: NEGATIVE
Glucose, UA: NEGATIVE
Ketones, UA: NEGATIVE
LEUKOCYTES UA: NEGATIVE
NITRITE UA: NEGATIVE
PROTEIN UA: NEGATIVE
SPEC GRAV UA: 1.02
Urobilinogen, UA: 0.2
pH, UA: 5.5

## 2015-10-16 LAB — LIPID PANEL
CHOLESTEROL: 179 mg/dL (ref 0–200)
HDL: 42.6 mg/dL (ref >39.00)
LDL CALC: 110 mg/dL — AB (ref 0–99)
NonHDL: 136.23
TRIGLYCERIDES: 132 mg/dL (ref 0.0–149.0)
Total CHOL/HDL Ratio: 4
VLDL: 26.4 mg/dL (ref 0.0–40.0)

## 2015-10-16 LAB — HEPATIC FUNCTION PANEL
ALT: 27 U/L (ref 0–53)
AST: 18 U/L (ref 0–37)
Albumin: 4.3 g/dL (ref 3.5–5.2)
Alkaline Phosphatase: 81 U/L (ref 39–117)
BILIRUBIN TOTAL: 0.6 mg/dL (ref 0.2–1.2)
Bilirubin, Direct: 0.1 mg/dL (ref 0.0–0.3)
TOTAL PROTEIN: 6.9 g/dL (ref 6.0–8.3)

## 2015-10-16 LAB — TSH: TSH: 1.31 u[IU]/mL (ref 0.35–4.50)

## 2015-10-16 LAB — PSA: PSA: 0.47 ng/mL (ref 0.10–4.00)

## 2015-10-23 ENCOUNTER — Encounter: Payer: Self-pay | Admitting: Family Medicine

## 2015-10-23 ENCOUNTER — Ambulatory Visit (INDEPENDENT_AMBULATORY_CARE_PROVIDER_SITE_OTHER): Payer: 59 | Admitting: Family Medicine

## 2015-10-23 VITALS — BP 124/74 | HR 100 | Temp 99.2°F | Wt 230.9 lb

## 2015-10-23 DIAGNOSIS — Z23 Encounter for immunization: Secondary | ICD-10-CM | POA: Diagnosis not present

## 2015-10-23 DIAGNOSIS — E785 Hyperlipidemia, unspecified: Secondary | ICD-10-CM

## 2015-10-23 MED ORDER — OMEPRAZOLE 20 MG PO CPDR: 20.0000 mg | DELAYED_RELEASE_CAPSULE | Freq: Every day | ORAL | 3 refills | Status: DC

## 2015-10-23 MED ORDER — SILDENAFIL CITRATE 20 MG PO TABS: 20.0000 mg | ORAL_TABLET | Freq: Three times a day (TID) | ORAL | 10 refills | Status: DC

## 2015-10-23 MED ORDER — ACYCLOVIR 200 MG PO CAPS: 200.0000 mg | ORAL_CAPSULE | Freq: Every day | ORAL | 3 refills | Status: DC

## 2015-10-23 MED ORDER — VARENICLINE TARTRATE 1 MG PO TABS: 1.0000 mg | ORAL_TABLET | Freq: Two times a day (BID) | ORAL | 5 refills | Status: DC

## 2015-10-23 MED ORDER — SIMVASTATIN 40 MG PO TABS: 40.0000 mg | ORAL_TABLET | Freq: Every day | ORAL | 3 refills | Status: DC

## 2015-10-23 MED ORDER — CITALOPRAM HYDROBROMIDE 20 MG PO TABS: 20.0000 mg | ORAL_TABLET | Freq: Every day | ORAL | 3 refills | Status: DC

## 2015-10-23 NOTE — Progress Notes (Signed)
Pre visit review using our clinic review tool, if applicable. No additional management support is needed unless otherwise documented below in the visit note. 

## 2015-10-23 NOTE — Patient Instructions (Signed)
Chantix 1 mg...........Marland Kitchen 1 tablet twice daily  Taper you cigarettes by one per week........ Quit date in 7 weeks  Continue the Mediterranean diet............ No high fructose corn syrup or sugar  Continue daily walking  Follow-up in one year sooner if any problems  Increase your Zocor to 40 mg daily

## 2015-10-23 NOTE — Progress Notes (Signed)
Connor Thomas is a 55 year old married male smoker...Marland KitchenMarland KitchenMarland Kitchen 10 days 7 cigarettes a day on Chantix 1 mg twice a day... Who comes in today for general physical examination  He takes aspirin and Zocor daily for hyperlipidemia. Total cholesterol 179 HDL 43 LDL 110. Would at that Zocor to 40 mg daily  He takes Celexa 20 mg at bedtime for sleep, Prilosec 20 mg in the morning because a history of reflux.  He gets routine eye care, dental care, colonoscopy 2008.  Vaccinations up-to-date seasonal flu shot given today  He works in Press photographer for [employer].  Review of systems otherwise negative except for his weight. He is 230 pounds. He's lost 4 pounds since June. He's been on a diet and exercise program. He had his wife are now walking 30 minutes daily  Physical examination  Vital signs stable he is afebrile except for his weight. HEENT were negative neck was supple no adenopathy thyroid normal. Cardiopulmonary exam normal abdominal exam normal skin normal peripheral pulses normal no carotid or aortic bruits. Skin normal  Impression  #1 hyperlipidemia.......... Increase Zocor to 40 mg daily  #2 obesity.........Marland Kitchen Again encouraged diet exercise and weight loss  #3 history of mild depression......... Continue Celexa 20 mg daily  #4 reflux esophagitis...Marland KitchenMarland KitchenMarland Kitchen Continue Prilosec  #5 mild ED........... Generic Viagra  #6 tobacco abuse......... Continue Chantix 1 mg twice a day taper off over the next 7 weeks.

## 2016-02-05 ENCOUNTER — Encounter: Payer: Self-pay | Admitting: Gastroenterology

## 2016-02-14 ENCOUNTER — Encounter: Payer: Self-pay | Admitting: Gastroenterology

## 2016-03-14 DIAGNOSIS — Z8719 Personal history of other diseases of the digestive system: Secondary | ICD-10-CM | POA: Diagnosis not present

## 2016-03-14 DIAGNOSIS — Z9889 Other specified postprocedural states: Secondary | ICD-10-CM | POA: Diagnosis not present

## 2016-03-29 ENCOUNTER — Ambulatory Visit (AMBULATORY_SURGERY_CENTER): Payer: Self-pay | Admitting: *Deleted

## 2016-03-29 VITALS — Ht 68.0 in | Wt 240.0 lb

## 2016-03-29 DIAGNOSIS — Z1211 Encounter for screening for malignant neoplasm of colon: Secondary | ICD-10-CM

## 2016-03-29 MED ORDER — NA SULFATE-K SULFATE-MG SULF 17.5-3.13-1.6 GM/177ML PO SOLN: ORAL | 0 refills | Status: DC

## 2016-03-29 NOTE — Progress Notes (Signed)
Patient denies any allergies to eggs or soy. Patient denies any problems with anesthesia/sedation. Patient denies any oxygen use at home and does not take any diet/weight loss medications.  

## 2016-03-29 NOTE — Progress Notes (Signed)
Patient states he has hernia beside repaired umbilical hernia and is for hernia repair surgery in April 2018.

## 2016-04-01 ENCOUNTER — Encounter: Payer: Self-pay | Admitting: Gastroenterology

## 2016-04-11 ENCOUNTER — Ambulatory Visit (AMBULATORY_SURGERY_CENTER): Payer: 59 | Admitting: Gastroenterology

## 2016-04-11 ENCOUNTER — Encounter: Payer: Self-pay | Admitting: Gastroenterology

## 2016-04-11 VITALS — BP 137/76 | HR 97 | Temp 98.7°F | Resp 22 | Ht 68.0 in | Wt 240.0 lb

## 2016-04-11 DIAGNOSIS — D120 Benign neoplasm of cecum: Secondary | ICD-10-CM

## 2016-04-11 DIAGNOSIS — D128 Benign neoplasm of rectum: Secondary | ICD-10-CM | POA: Diagnosis not present

## 2016-04-11 DIAGNOSIS — D129 Benign neoplasm of anus and anal canal: Secondary | ICD-10-CM

## 2016-04-11 DIAGNOSIS — K621 Rectal polyp: Secondary | ICD-10-CM | POA: Diagnosis not present

## 2016-04-11 DIAGNOSIS — Z1211 Encounter for screening for malignant neoplasm of colon: Secondary | ICD-10-CM | POA: Diagnosis not present

## 2016-04-11 DIAGNOSIS — Z86010 Personal history of colonic polyps: Secondary | ICD-10-CM | POA: Diagnosis not present

## 2016-04-11 DIAGNOSIS — D125 Benign neoplasm of sigmoid colon: Secondary | ICD-10-CM

## 2016-04-11 DIAGNOSIS — Z1212 Encounter for screening for malignant neoplasm of rectum: Secondary | ICD-10-CM

## 2016-04-11 HISTORY — PX: COLONOSCOPY: SHX174

## 2016-04-11 MED ORDER — SODIUM CHLORIDE 0.9 % IV SOLN: 500.0000 mL | INTRAVENOUS | Status: DC

## 2016-04-11 NOTE — Op Note (Signed)
[facility] Patient Name: Connor Thomas Procedure Date: 04/11/2016 7:55 AM MRN: [id number] Endoscopist: Remo Lipps P. Armbruster MD, MD Age: 56 Referring MD:  Date of Birth: 11-17-1960 Gender: Male Account #: [id number] Procedure:                Colonoscopy Indications:              Screening for colorectal malignant neoplasm Medicines:                Monitored Anesthesia Care Procedure:                Pre-Anesthesia Assessment:                           - Prior to the procedure, a History and Physical                            was performed, and patient medications and                            allergies were reviewed. The patient's tolerance of                            previous anesthesia was also reviewed. The risks                            and benefits of the procedure and the sedation                            options and risks were discussed with the patient.                            All questions were answered, and informed consent                            was obtained. Prior Anticoagulants: The patient has                            taken aspirin, last dose was 1 day prior to                            procedure. ASA Grade Assessment: II - A patient                            with mild systemic disease. After reviewing the                            risks and benefits, the patient was deemed in                            satisfactory condition to undergo the procedure.                           After obtaining informed consent, the colonoscope  was passed under direct vision. Throughout the                            procedure, the patient's blood pressure, pulse, and                            oxygen saturations were monitored continuously. The                            Colonoscope was introduced through the anus and                            advanced to the the cecum, identified by                            appendiceal orifice and  ileocecal valve. The                            colonoscopy was performed without difficulty. The                            patient tolerated the procedure well. The quality                            of the bowel preparation was good. The ileocecal                            valve, appendiceal orifice, and rectum were                            photographed. Scope In: 7:59:51 AM Scope Out: 8:18:19 AM Scope Withdrawal Time: 0 hours 13 minutes 25 seconds  Total Procedure Duration: 0 hours 18 minutes 28 seconds  Findings:                 The perianal and digital rectal examinations were                            normal.                           A 5 mm polyp was found in the cecum. The polyp was                            sessile. The polyp was removed with a cold snare.                            Resection and retrieval were complete.                           Two sessile polyps were found in the sigmoid colon.                            The polyps were 4 to 5 mm in size. These polyps  were removed with a cold snare. Resection and                            retrieval were complete.                           A 4 mm polyp was found in the rectum. The polyp was                            sessile. The polyp was removed with a cold snare.                            Resection and retrieval were complete.                           Internal hemorrhoids were found during                            retroflexion. The hemorrhoids were small.                           The exam was otherwise without abnormality. Complications:            No immediate complications. Estimated blood loss:                            Minimal. Estimated Blood Loss:     Estimated blood loss was minimal. Impression:               - One 5 mm polyp in the cecum, removed with a cold                            snare. Resected and retrieved.                           - Two 4 to 5 mm polyps in the  sigmoid colon,                            removed with a cold snare. Resected and retrieved.                           - One 4 mm polyp in the rectum, removed with a cold                            snare. Resected and retrieved.                           - Internal hemorrhoids.                           - The examination was otherwise normal. Recommendation:           - Patient has a contact number available for  emergencies. The signs and symptoms of potential                            delayed complications were discussed with the                            patient. Return to normal activities tomorrow.                            Written discharge instructions were provided to the                            patient.                           - Resume previous diet.                           - Continue present medications.                           - No ibuprofen, naproxen, or other non-steroidal                            anti-inflammatory drugs for 2 weeks after polyp                            removal.                           - Await pathology results.                           - Repeat colonoscopy is recommended for                            surveillance. The colonoscopy date will be                            determined after pathology results from today's                            exam become available for review. Remo Lipps P. Armbruster MD, MD 04/11/2016 8:21:42 AM This report has been signed electronically.

## 2016-04-11 NOTE — Progress Notes (Signed)
Report given to PACU, vss 

## 2016-04-11 NOTE — Progress Notes (Signed)
Called to room to assist during endoscopic procedure.  Patient ID and intended procedure confirmed with present staff. Received instructions for my participation in the procedure from the performing physician.  

## 2016-04-11 NOTE — Patient Instructions (Signed)
YOU HAD AN ENDOSCOPIC PROCEDURE TODAY AT [facility]:   Refer to the procedure report that was given to you for any specific questions about what was found during the examination.  If the procedure report does not answer your questions, please call your gastroenterologist to clarify.  If you requested that your care partner not be given the details of your procedure findings, then the procedure report has been included in a sealed envelope for you to review at your convenience later.  YOU SHOULD EXPECT: Some feelings of bloating in the abdomen. Passage of more gas than usual.  Walking can help get rid of the air that was put into your GI tract during the procedure and reduce the bloating. If you had a lower endoscopy (such as a colonoscopy or flexible sigmoidoscopy) you may notice spotting of blood in your stool or on the toilet paper. If you underwent a bowel prep for your procedure, you may not have a normal bowel movement for a few days.  Please Note:  You might notice some irritation and congestion in your nose or some drainage.  This is from the oxygen used during your procedure.  There is no need for concern and it should clear up in a day or so.  SYMPTOMS TO REPORT IMMEDIATELY:   Following lower endoscopy (colonoscopy or flexible sigmoidoscopy):  Excessive amounts of blood in the stool  Significant tenderness or worsening of abdominal pains  Swelling of the abdomen that is new, acute  Fever of 100F or higher   For urgent or emergent issues, a gastroenterologist can be reached at any hour by calling [phone].   DIET:  We do recommend a small meal at first, but then you may proceed to your regular diet.  Drink plenty of fluids but you should avoid alcoholic beverages for 24 hours.  ACTIVITY:  You should plan to take it easy for the rest of today and you should NOT DRIVE or use heavy machinery until tomorrow (because of the sedation medicines used during the test).     FOLLOW UP: Our staff will call the number listed on your records the next business day following your procedure to check on you and address any questions or concerns that you may have regarding the information given to you following your procedure. If we do not reach you, we will leave a message.  However, if you are feeling well and you are not experiencing any problems, there is no need to return our call.  We will assume that you have returned to your regular daily activities without incident.  If any biopsies were taken you will be contacted by phone or by letter within the next 1-3 weeks.  Please call us at [phone] if you have not heard about the biopsies in 3 weeks.    SIGNATURES/CONFIDENTIALITY: You and/or your care partner have signed paperwork which will be entered into your electronic medical record.  These signatures attest to the fact that that the information above on your After Visit Summary has been reviewed and is understood.  Full responsibility of the confidentiality of this discharge information lies with you and/or your care-partner.  Read all of the handouts given to you by your recovery room nurse.  Thank-you for choosing [place]

## 2016-04-12 ENCOUNTER — Telehealth: Payer: Self-pay | Admitting: *Deleted

## 2016-04-12 ENCOUNTER — Telehealth: Payer: Self-pay

## 2016-04-12 NOTE — Telephone Encounter (Signed)
No answer, left message to call if questions or concerns. 

## 2016-04-12 NOTE — Telephone Encounter (Signed)
  Follow up Call-  Call back number 04/11/2016  Post procedure Call Back phone  # [phone]  Permission to leave phone message Yes  Some recent data might be hidden    Left message

## 2016-04-18 ENCOUNTER — Encounter: Payer: Self-pay | Admitting: Gastroenterology

## 2016-04-23 ENCOUNTER — Telehealth: Payer: Self-pay | Admitting: Family Medicine

## 2016-04-23 NOTE — Telephone Encounter (Signed)
Pts wife state that the son has been Dx with the flu by urgent care and now the pt is not feeling good he has the following symptoms no fever,chills,eyes hurt and body aches x 1 day.  Pt refuse appointment and would like to see if he could get Tamiflu called in.  Pharm:  [facility] on [address]

## 2016-04-24 MED ORDER — OSELTAMIVIR PHOSPHATE 75 MG PO CAPS: 75.0000 mg | ORAL_CAPSULE | Freq: Every day | ORAL | 0 refills | Status: DC

## 2016-04-24 NOTE — Telephone Encounter (Signed)
Tamiflu 75 mg daily x 7 days

## 2016-04-24 NOTE — Telephone Encounter (Signed)
Spoke to Santiago Glad (wife) and advised that Tommi Rumps is sending in a prescription to the pharmacy.  Advised to call back if not getting better.

## 2016-05-09 ENCOUNTER — Ambulatory Visit: Payer: Self-pay | Admitting: Surgery

## 2016-05-09 NOTE — H&P (Signed)
JATNIEL VERASTEGUI 03/14/2016 12:05 PM Location: [facility] Patient #: [id number] DOB: 1960/12/11 Married / Language: Cleophus Molt / Race: White Male   History of Present Illness Rodman Key B. Hassell Done MD; 03/14/2016 12:51 PM) Patient words: Surgeon: Kaylyn Lim, MD, FACS Asst: none Anes: General endotracheal Procedure: Open umbilical hernia repair with Proceed circular 4 cm patch Diagnosis: Umbilical hernia Complications: none EBL: minimal cc Description of Procedure: The patient was taken to oh or 3 at [facility] day surgery. General endotracheal anesthesia was administered and the umbilical region was clipped and prepped with PCMX and draped sterilely. A timeout was performed and a curvilinear incision was made infraumbilically. The umbilical skin was raised off this chronically incarcerated umbilical hernia without significantly undermining the skin but getting above the defect. The fascial defect was then freed at the ring level so that it could be reduced below the fascia and a finger inserted and swept around range clear space for the proceed mesh patch. This was then inserted into the hole which was about a centimeter in diameter and deployed using the straps to pull it up against the fascia and then 3 sutures of 0 Prolene were placed fixing the patch to the fascia superiorly and inferiorly and then 2 more sutures were placed medially and laterally to tack it to the fascia all the way around. The fascia was then infiltrated with Exparel and the wound closed in layers with 4-0 Vicryl subcutaneously and tacking the skin to the fascia and then subcuticularly and with Dermabond. Abdominal binder was applied the patient was taken recovery room in satisfactory condition [name] Done, MD, [facility], [place]  Electronically signed by Pedro Earls, MD at 03/25/2012 11:56  AM  He has a recurrence to the left of the other repair. Needs laparoscopic assisted repair. I discussed this with him and Santiago Glad who accompanied him. He has a fishing trip in April. This mass is pushing out and hurting periodically.  The patient is a 56 year old male.   Allergies Malachy Moan, [place]; 03/14/2016 12:05 PM) No Known Allergies 03/14/2016  Medication History Malachy Moan, [place]; 03/14/2016 12:05 PM) Citalopram Hydrobromide (20MG  Tablet, Oral) Active. Omeprazole (20MG  Capsule DR, Oral) Active. RaNITidine HCl (300MG  Tablet, Oral) Active. Simvastatin (20MG  Tablet, Oral) Active. Fluticasone Propionate (50MCG/ACT Suspension, Nasal) Active. Medications Reconciled  Vitals Rogelio Waynick RMA; 03/14/2016 12:06 PM) 03/14/2016 12:06 PM Weight: 238.4 lb Height: 68in Body Surface Area: 2.2 m Body Mass Index: 36.25 kg/m  Temp.: 4F  Pulse: 91 (Regular)  BP: 126/74 (Sitting, Left Arm, Standard)       Physical Exam (Pamalee Marcoe B. Hassell Done MD; 03/14/2016 12:53 PM) Abdomen Note: Mass to the left of the old repair      Assessment & Plan Rodman Key B. Hassell Done MD; 03/14/2016 24:09 PM) H/O UMBILICAL HERNIA REPAIR [phone]) Impression: recurrence to the left of the old repair Plan:  Lap assisted repair of ventral hernia-recurrent

## 2016-05-10 ENCOUNTER — Encounter (INDEPENDENT_AMBULATORY_CARE_PROVIDER_SITE_OTHER): Payer: Self-pay

## 2016-05-10 ENCOUNTER — Encounter (HOSPITAL_COMMUNITY)
Admission: RE | Admit: 2016-05-10 | Discharge: 2016-05-10 | Disposition: A | Discharge status: Home / Self Care | Payer: 59 | Source: Ambulatory Visit | Attending: Surgery | Admitting: Surgery

## 2016-05-10 ENCOUNTER — Encounter (HOSPITAL_COMMUNITY): Payer: Self-pay

## 2016-05-10 DIAGNOSIS — Z01818 Encounter for other preprocedural examination: Secondary | ICD-10-CM | POA: Diagnosis not present

## 2016-05-10 DIAGNOSIS — Z01812 Encounter for preprocedural laboratory examination: Secondary | ICD-10-CM | POA: Diagnosis not present

## 2016-05-10 DIAGNOSIS — K432 Incisional hernia without obstruction or gangrene: Secondary | ICD-10-CM | POA: Insufficient documentation

## 2016-05-10 LAB — CBC
HCT: 40.9 % (ref 39.0–52.0)
Hemoglobin: 14 g/dL (ref 13.0–17.0)
MCH: 30.6 pg (ref 26.0–34.0)
MCHC: 34.2 g/dL (ref 30.0–36.0)
MCV: 89.3 fL (ref 78.0–100.0)
PLATELETS: 300 10*3/uL (ref 150–400)
RBC: 4.58 MIL/uL (ref 4.22–5.81)
RDW: 12.9 % (ref 11.5–15.5)
WBC: 8.9 10*3/uL (ref 4.0–10.5)

## 2016-05-10 NOTE — Patient Instructions (Addendum)
Connor Thomas  05/10/2016   Your procedure is scheduled on: 05-16-16  Report to [facility] to 3rd floor to [place] at 0700 AM.    Call this number if you have problems the morning of surgery [phone]   Remember: ONLY 1 PERSON MAY GO WITH YOU TO SHORT STAY TO GET  READY MORNING OF YOUR SURGERY.  Do not eat food or drink liquids :After Midnight.     Take these medicines the morning of surgery with A SIP OF WATER: Citalopram (Celexa), Omeprazole (Prilosec), and Claritin if needed. You may also bring  and use your nasal spray and inhaler.                                You may not have any metal on your body including hair pins and              piercings  Do not wear jewelry, make-up, lotions, powders or perfumes, deodorant                          Men may shave face and neck.   Do not bring valuables to the hospital. [place].  Contacts, dentures or bridgework may not be worn into surgery.  Leave suitcase in the car. After surgery it may be brought to your room.                  Please read over the following fact sheets you were given: _____________________________________________________________________             [facility] - Preparing for Surgery Before surgery, you can play an important role.  Because skin is not sterile, your skin needs to be as free of germs as possible.  You can reduce the number of germs on your skin by washing with CHG (chlorahexidine gluconate) soap before surgery.  CHG is an antiseptic cleaner which kills germs and bonds with the skin to continue killing germs even after washing. Please DO NOT use if you have an allergy to CHG or antibacterial soaps.  If your skin becomes reddened/irritated stop using the CHG and inform your nurse when you arrive at Short Stay. Do not shave (including legs and underarms) for at least 48 hours prior to  the first CHG shower.  You may shave your face/neck. Please follow these instructions carefully:  1.  Shower with CHG Soap the night before surgery and the  morning of Surgery.  2.  If you choose to wash your hair, wash your hair first as usual with your  normal  shampoo.  3.  After you shampoo, rinse your hair and body thoroughly to remove the  shampoo.                           4.  Use CHG as you would any other liquid soap.  You can apply chg directly  to the skin and wash                       Gently with a scrungie or clean washcloth.  5.  Apply  the CHG Soap to your body ONLY FROM THE NECK DOWN.   Do not use on face/ open                           Wound or open sores. Avoid contact with eyes, ears mouth and genitals (private parts).                       Wash face,  Genitals (private parts) with your normal soap.             6.  Wash thoroughly, paying special attention to the area where your surgery  will be performed.  7.  Thoroughly rinse your body with warm water from the neck down.  8.  DO NOT shower/wash with your normal soap after using and rinsing off  the CHG Soap.                9.  Pat yourself dry with a clean towel.            10.  Wear clean pajamas.            11.  Place clean sheets on your bed the night of your first shower and do not  sleep with pets. Day of Surgery : Do not apply any lotions/deodorants the morning of surgery.  Please wear clean clothes to the hospital/surgery center.  FAILURE TO FOLLOW THESE INSTRUCTIONS MAY RESULT IN THE CANCELLATION OF YOUR SURGERY PATIENT SIGNATURE_________________________________  NURSE SIGNATURE__________________________________  ________________________________________________________________________

## 2016-05-16 ENCOUNTER — Encounter (HOSPITAL_COMMUNITY)
Admission: RE | Disposition: A | Discharge status: Home / Self Care | Payer: Self-pay | Source: Ambulatory Visit | Attending: Surgery

## 2016-05-16 ENCOUNTER — Ambulatory Visit (HOSPITAL_COMMUNITY): Payer: 59 | Admitting: Anesthesiology

## 2016-05-16 ENCOUNTER — Ambulatory Visit (HOSPITAL_COMMUNITY)
Admission: RE | Admit: 2016-05-16 | Discharge: 2016-05-16 | Disposition: A | Discharge status: Home / Self Care | Payer: 59 | Source: Ambulatory Visit | Attending: Surgery | Admitting: Surgery

## 2016-05-16 ENCOUNTER — Encounter (HOSPITAL_COMMUNITY): Payer: Self-pay | Admitting: *Deleted

## 2016-05-16 DIAGNOSIS — Z79899 Other long term (current) drug therapy: Secondary | ICD-10-CM | POA: Insufficient documentation

## 2016-05-16 DIAGNOSIS — F17200 Nicotine dependence, unspecified, uncomplicated: Secondary | ICD-10-CM | POA: Diagnosis not present

## 2016-05-16 DIAGNOSIS — Z7951 Long term (current) use of inhaled steroids: Secondary | ICD-10-CM | POA: Insufficient documentation

## 2016-05-16 DIAGNOSIS — K430 Incisional hernia with obstruction, without gangrene: Secondary | ICD-10-CM | POA: Insufficient documentation

## 2016-05-16 DIAGNOSIS — Z8719 Personal history of other diseases of the digestive system: Secondary | ICD-10-CM

## 2016-05-16 DIAGNOSIS — E785 Hyperlipidemia, unspecified: Secondary | ICD-10-CM | POA: Diagnosis not present

## 2016-05-16 DIAGNOSIS — K219 Gastro-esophageal reflux disease without esophagitis: Secondary | ICD-10-CM | POA: Diagnosis not present

## 2016-05-16 DIAGNOSIS — K429 Umbilical hernia without obstruction or gangrene: Secondary | ICD-10-CM | POA: Diagnosis not present

## 2016-05-16 DIAGNOSIS — Z9889 Other specified postprocedural states: Secondary | ICD-10-CM

## 2016-05-16 HISTORY — DX: Personal history of other diseases of the digestive system: Z87.19

## 2016-05-16 HISTORY — PX: UMBILICAL HERNIA REPAIR: SHX196

## 2016-05-16 SURGERY — REPAIR, HERNIA, UMBILICAL, LAPAROSCOPIC
Anesthesia: General | Site: Abdomen

## 2016-05-16 MED ORDER — SUCCINYLCHOLINE CHLORIDE 200 MG/10ML IV SOSY
PREFILLED_SYRINGE | INTRAVENOUS | Status: AC
  Filled 2016-05-16: qty 10

## 2016-05-16 MED ORDER — PHENYLEPHRINE HCL 10 MG/ML IJ SOLN
INTRAMUSCULAR | Status: DC | PRN
  Administered 2016-05-16 (×3): 80 ug via INTRAVENOUS

## 2016-05-16 MED ORDER — FENTANYL CITRATE (PF) 250 MCG/5ML IJ SOLN
INTRAMUSCULAR | Status: AC
  Filled 2016-05-16: qty 5

## 2016-05-16 MED ORDER — CHLORHEXIDINE GLUCONATE CLOTH 2 % EX PADS: 6.0000 | MEDICATED_PAD | Freq: Once | CUTANEOUS | Status: DC

## 2016-05-16 MED ORDER — PROPOFOL 10 MG/ML IV BOLUS
INTRAVENOUS | Status: DC | PRN
  Administered 2016-05-16: 150 mg via INTRAVENOUS

## 2016-05-16 MED ORDER — ONDANSETRON HCL 4 MG/2ML IJ SOLN
INTRAMUSCULAR | Status: DC | PRN
  Administered 2016-05-16: 4 mg via INTRAVENOUS

## 2016-05-16 MED ORDER — ACETAMINOPHEN 650 MG RE SUPP
650.0000 mg | RECTAL | Status: DC | PRN
  Filled 2016-05-16: qty 1

## 2016-05-16 MED ORDER — LIDOCAINE 2% (20 MG/ML) 5 ML SYRINGE
INTRAMUSCULAR | Status: AC
  Filled 2016-05-16: qty 5

## 2016-05-16 MED ORDER — ROCURONIUM BROMIDE 50 MG/5ML IV SOSY
PREFILLED_SYRINGE | INTRAVENOUS | Status: AC
  Filled 2016-05-16: qty 5

## 2016-05-16 MED ORDER — HYDROMORPHONE HCL 1 MG/ML IJ SOLN: 0.2500 mg | INTRAMUSCULAR | Status: DC | PRN

## 2016-05-16 MED ORDER — MIDAZOLAM HCL 2 MG/2ML IJ SOLN
INTRAMUSCULAR | Status: AC
  Filled 2016-05-16: qty 2

## 2016-05-16 MED ORDER — DEXAMETHASONE SODIUM PHOSPHATE 10 MG/ML IJ SOLN
INTRAMUSCULAR | Status: AC
  Filled 2016-05-16: qty 1

## 2016-05-16 MED ORDER — ACETAMINOPHEN 500 MG PO TABS
1000.0000 mg | ORAL_TABLET | ORAL | Status: AC
  Administered 2016-05-16: 1000 mg via ORAL
  Filled 2016-05-16: qty 2

## 2016-05-16 MED ORDER — ACETAMINOPHEN 325 MG PO TABS: 650.0000 mg | ORAL_TABLET | ORAL | Status: DC | PRN

## 2016-05-16 MED ORDER — SUGAMMADEX SODIUM 200 MG/2ML IV SOLN
INTRAVENOUS | Status: AC
  Filled 2016-05-16: qty 2

## 2016-05-16 MED ORDER — ONDANSETRON HCL 4 MG/2ML IJ SOLN
INTRAMUSCULAR | Status: AC
  Filled 2016-05-16: qty 2

## 2016-05-16 MED ORDER — PROPOFOL 10 MG/ML IV BOLUS
INTRAVENOUS | Status: AC
  Filled 2016-05-16: qty 20

## 2016-05-16 MED ORDER — SODIUM CHLORIDE 0.9% FLUSH: 3.0000 mL | Freq: Two times a day (BID) | INTRAVENOUS | Status: DC

## 2016-05-16 MED ORDER — CEFAZOLIN SODIUM-DEXTROSE 2-4 GM/100ML-% IV SOLN
2.0000 g | INTRAVENOUS | Status: AC
  Administered 2016-05-16: 2 g via INTRAVENOUS
  Filled 2016-05-16: qty 100

## 2016-05-16 MED ORDER — LACTATED RINGERS IV SOLN
INTRAVENOUS | Status: DC
  Administered 2016-05-16 (×2): via INTRAVENOUS

## 2016-05-16 MED ORDER — SODIUM CHLORIDE 0.9% FLUSH: 3.0000 mL | INTRAVENOUS | Status: DC | PRN

## 2016-05-16 MED ORDER — BUPIVACAINE LIPOSOME 1.3 % IJ SUSP
20.0000 mL | Freq: Once | INTRAMUSCULAR | Status: AC
  Administered 2016-05-16: 20 mL
  Filled 2016-05-16: qty 20

## 2016-05-16 MED ORDER — LACTATED RINGERS IR SOLN
Status: DC | PRN
  Administered 2016-05-16: 1000 mL

## 2016-05-16 MED ORDER — FENTANYL CITRATE (PF) 250 MCG/5ML IJ SOLN
INTRAMUSCULAR | Status: DC | PRN
  Administered 2016-05-16 (×2): 50 ug via INTRAVENOUS
  Administered 2016-05-16: 100 ug via INTRAVENOUS
  Administered 2016-05-16: 50 ug via INTRAVENOUS

## 2016-05-16 MED ORDER — CELECOXIB 200 MG PO CAPS
400.0000 mg | ORAL_CAPSULE | ORAL | Status: AC
  Administered 2016-05-16: 400 mg via ORAL
  Filled 2016-05-16: qty 2

## 2016-05-16 MED ORDER — ROCURONIUM BROMIDE 50 MG/5ML IV SOSY: PREFILLED_SYRINGE | INTRAVENOUS | Status: DC | PRN

## 2016-05-16 MED ORDER — PHENYLEPHRINE 40 MCG/ML (10ML) SYRINGE FOR IV PUSH (FOR BLOOD PRESSURE SUPPORT)
PREFILLED_SYRINGE | INTRAVENOUS | Status: AC
  Filled 2016-05-16: qty 10

## 2016-05-16 MED ORDER — SUGAMMADEX SODIUM 200 MG/2ML IV SOLN
INTRAVENOUS | Status: DC | PRN
  Administered 2016-05-16: 300 mg via INTRAVENOUS

## 2016-05-16 MED ORDER — GABAPENTIN 300 MG PO CAPS
300.0000 mg | ORAL_CAPSULE | ORAL | Status: AC
Start: 2016-05-16 — End: 2016-05-16
  Administered 2016-05-16: 300 mg via ORAL
  Filled 2016-05-16: qty 1

## 2016-05-16 MED ORDER — 0.9 % SODIUM CHLORIDE (POUR BTL) OPTIME
TOPICAL | Status: DC | PRN
  Administered 2016-05-16: 1000 mL

## 2016-05-16 MED ORDER — BUPIVACAINE LIPOSOME 1.3 % IJ SUSP: INTRAMUSCULAR | Status: DC | PRN

## 2016-05-16 MED ORDER — SUCCINYLCHOLINE CHLORIDE 200 MG/10ML IV SOSY
PREFILLED_SYRINGE | INTRAVENOUS | Status: DC | PRN
  Administered 2016-05-16: 100 mg via INTRAVENOUS

## 2016-05-16 MED ORDER — PROMETHAZINE HCL 25 MG/ML IJ SOLN: 6.2500 mg | INTRAMUSCULAR | Status: DC | PRN

## 2016-05-16 MED ORDER — LIDOCAINE 2% (20 MG/ML) 5 ML SYRINGE
INTRAMUSCULAR | Status: DC | PRN
  Administered 2016-05-16: 80 mg via INTRAVENOUS

## 2016-05-16 MED ORDER — ROCURONIUM BROMIDE 50 MG/5ML IV SOSY
PREFILLED_SYRINGE | INTRAVENOUS | Status: DC | PRN
  Administered 2016-05-16 (×2): 10 mg via INTRAVENOUS
  Administered 2016-05-16: 40 mg via INTRAVENOUS
  Administered 2016-05-16 (×2): 10 mg via INTRAVENOUS

## 2016-05-16 MED ORDER — MIDAZOLAM HCL 2 MG/2ML IJ SOLN
INTRAMUSCULAR | Status: DC | PRN
  Administered 2016-05-16: 2 mg via INTRAVENOUS

## 2016-05-16 MED ORDER — SODIUM CHLORIDE 0.9 % IV SOLN: 250.0000 mL | INTRAVENOUS | Status: DC | PRN

## 2016-05-16 MED ORDER — HEPARIN SODIUM (PORCINE) 5000 UNIT/ML IJ SOLN
5000.0000 [IU] | Freq: Once | INTRAMUSCULAR | Status: AC
  Administered 2016-05-16: 5000 [IU] via SUBCUTANEOUS
  Filled 2016-05-16: qty 1

## 2016-05-16 MED ORDER — DEXAMETHASONE SODIUM PHOSPHATE 10 MG/ML IJ SOLN
INTRAMUSCULAR | Status: DC | PRN
  Administered 2016-05-16: 10 mg via INTRAVENOUS

## 2016-05-16 MED ORDER — OXYCODONE HCL 5 MG PO TABS
5.0000 mg | ORAL_TABLET | ORAL | Status: DC | PRN
  Administered 2016-05-16: 10 mg via ORAL
  Filled 2016-05-16: qty 2

## 2016-05-16 MED ORDER — OXYCODONE HCL 5 MG PO TABS: 5.0000 mg | ORAL_TABLET | ORAL | Status: DC | PRN

## 2016-05-16 MED ORDER — HYDROCODONE-ACETAMINOPHEN 5-325 MG PO TABS: 1.0000 | ORAL_TABLET | ORAL | 0 refills | Status: DC | PRN

## 2016-05-16 SURGICAL SUPPLY — 42 items
ANCHOR NEEDLE 9/16 CIR SZ 8 (NEEDLE) ×3 IMPLANT
BINDER ABDOMINAL 12 ML 46-62 (SOFTGOODS) ×3 IMPLANT
CLOSURE WOUND 1/2 X4 (GAUZE/BANDAGES/DRESSINGS)
COVER SURGICAL LIGHT HANDLE (MISCELLANEOUS) ×3 IMPLANT
DECANTER SPIKE VIAL GLASS SM (MISCELLANEOUS) ×3 IMPLANT
DERMABOND ADVANCED (GAUZE/BANDAGES/DRESSINGS) ×2
DERMABOND ADVANCED .7 DNX12 (GAUZE/BANDAGES/DRESSINGS) ×1 IMPLANT
DEVICE SECURE STRAP 25 ABSORB (INSTRUMENTS) ×6 IMPLANT
DEVICE TROCAR PUNCTURE CLOSURE (ENDOMECHANICALS) ×3 IMPLANT
DISSECTOR BLUNT TIP ENDO 5MM (MISCELLANEOUS) IMPLANT
DRAIN CHANNEL 19F RND (DRAIN) IMPLANT
ELECT PENCIL ROCKER SW 15FT (MISCELLANEOUS) ×3 IMPLANT
ELECT REM PT RETURN 15FT ADLT (MISCELLANEOUS) ×3 IMPLANT
EVACUATOR SILICONE 100CC (DRAIN) IMPLANT
GLOVE BIOGEL M 8.0 STRL (GLOVE) ×3 IMPLANT
GOWN SPEC L4 XLG W/TWL (GOWN DISPOSABLE) ×3 IMPLANT
GOWN STRL REUS W/TWL XL LVL3 (GOWN DISPOSABLE) ×12 IMPLANT
GRASPER SUT TROCAR 14GX15 (MISCELLANEOUS) ×3 IMPLANT
IRRIG SUCT STRYKERFLOW 2 WTIP (MISCELLANEOUS)
IRRIGATION SUCT STRKRFLW 2 WTP (MISCELLANEOUS) IMPLANT
KIT BASIN OR (CUSTOM PROCEDURE TRAY) ×3 IMPLANT
MARKER SKIN DUAL TIP RULER LAB (MISCELLANEOUS) IMPLANT
MESH VENT ST 11.4CM L CIR (Mesh General) ×3 IMPLANT
NEEDLE SPNL 22GX3.5 QUINCKE BK (NEEDLE) ×3 IMPLANT
PAD POSITIONING PINK XL (MISCELLANEOUS) IMPLANT
POSITIONER SURGICAL ARM (MISCELLANEOUS) IMPLANT
SHEARS CURVED HARMONIC AC 45CM (MISCELLANEOUS) IMPLANT
SLEEVE XCEL OPT CAN 5 100 (ENDOMECHANICALS) ×9 IMPLANT
SOLUTION ANTI FOG 6CC (MISCELLANEOUS) ×3 IMPLANT
STAPLER VISISTAT 35W (STAPLE) IMPLANT
STRIP CLOSURE SKIN 1/2X4 (GAUZE/BANDAGES/DRESSINGS) IMPLANT
SUT MNCRL AB 4-0 PS2 18 (SUTURE) ×12 IMPLANT
SUT NOVA 0 T19/GS 22DT (SUTURE) IMPLANT
SUT NOVA NAB DX-16 0-1 5-0 T12 (SUTURE) ×12 IMPLANT
SUT VIC AB 4-0 SH 18 (SUTURE) ×3 IMPLANT
TACKER 5MM HERNIA 3.5CML NAB (ENDOMECHANICALS) ×3 IMPLANT
TOWEL OR 17X26 10 PK STRL BLUE (TOWEL DISPOSABLE) ×3 IMPLANT
TOWEL OR NON WOVEN STRL DISP B (DISPOSABLE) ×3 IMPLANT
TRAY LAPAROSCOPIC (CUSTOM PROCEDURE TRAY) ×3 IMPLANT
TROCAR BLADELESS OPT 5 100 (ENDOMECHANICALS) ×3 IMPLANT
TROCAR XCEL NON-BLD 11X100MML (ENDOMECHANICALS) ×3 IMPLANT
TUBING INSUF HEATED (TUBING) ×3 IMPLANT

## 2016-05-16 NOTE — Op Note (Signed)
Surgeon: Kaylyn Lim, MD, FACS  Asst:  none  Anes:  general  Preop Dx: Recurrent ventral hernia Postop Dx: Recurrent umbilical hernia  Procedure: Lap assisted repair of recurrent ventral hernia Location Surgery: [facility] OR 2 Complications: none  EBL:   4 cc  Drains: none  Description of Procedure:  The patient was taken to OR 2 .  After anesthesia was administered and the patient was prepped a timeout was performed.     Access in the left upper quadrant.  Omentum stuck up in the area where the prior umbilical ventralex patch had pulled completely away creating a 2 finger breadth defect.  The skin in the umbilicus was opened transversely and the fascia dissected.  11.4 cm circular coated mesh (4.5 inch) was prepared with 4 sutures of #1 Novafil and a center suture for orientation.  The fascia of the hernia was then closed with interrupted #1 Novafil with the orienting suture through the middle.  Incisions were made to allow retrieval of the four sutures and two more were inserted through the left side from the opening in the skin.  The sutures were tacked with the Secure Strap (2 staplers) and a single 5 mm port was placed on the right side to complete the anchoring.  Exparel was infitrated.  Skin closed with 4-0 Monocryl and Dermabond.     The patient tolerated the procedure well and was taken to the PACU in stable condition.     [name] Done, [place], [facility], [place]

## 2016-05-16 NOTE — Anesthesia Preprocedure Evaluation (Signed)
Anesthesia Evaluation  Patient identified by MRN, date of birth, ID band Patient awake    Reviewed: Allergy & Precautions, NPO status , Patient's Chart, lab work & pertinent test results  Airway Mallampati: II  TM Distance: >3 FB Neck ROM: Full    Dental no notable dental hx.    Pulmonary Current Smoker,    Pulmonary exam normal breath sounds clear to auscultation       Cardiovascular negative cardio ROS Normal cardiovascular exam Rhythm:Regular Rate:Normal     Neuro/Psych negative neurological ROS  negative psych ROS   GI/Hepatic Neg liver ROS, GERD  ,  Endo/Other  negative endocrine ROS  Renal/GU negative Renal ROS  negative genitourinary   Musculoskeletal negative musculoskeletal ROS (+)   Abdominal   Peds negative pediatric ROS (+)  Hematology negative hematology ROS (+)   Anesthesia Other Findings   Reproductive/Obstetrics negative OB ROS                             Anesthesia Physical Anesthesia Plan  ASA: II  Anesthesia Plan: General   Post-op Pain Management:    Induction: Intravenous  Airway Management Planned: Oral ETT  Additional Equipment:   Intra-op Plan:   Post-operative Plan: Extubation in OR  Informed Consent: I have reviewed the patients History and Physical, chart, labs and discussed the procedure including the risks, benefits and alternatives for the proposed anesthesia with the patient or authorized representative who has indicated his/her understanding and acceptance.   Dental advisory given  Plan Discussed with: CRNA and Surgeon  Anesthesia Plan Comments:         Anesthesia Quick Evaluation

## 2016-05-16 NOTE — Anesthesia Procedure Notes (Signed)
Procedure Name: Intubation Date/Time: 05/16/2016 9:27 AM Performed by: Dione Booze Pre-anesthesia Checklist: Suction available, Emergency Drugs available, Patient being monitored and Patient identified Patient Re-evaluated:Patient Re-evaluated prior to inductionOxygen Delivery Method: Circle system utilized Preoxygenation: Pre-oxygenation with 100% oxygen Intubation Type: IV induction Laryngoscope Size: Mac and 4 Grade View: Grade II Tube type: Oral Tube size: 7.5 mm Number of attempts: 1 Airway Equipment and Method: Stylet Placement Confirmation: ETT inserted through vocal cords under direct vision,  positive ETCO2 and breath sounds checked- equal and bilateral Secured at: 22 cm Tube secured with: Tape Dental Injury: Teeth and Oropharynx as per pre-operative assessment

## 2016-05-16 NOTE — H&P (View-Only) (Signed)
QUINTRELL BAZE 03/14/2016 12:05 PM Location: [facility] Patient #: [id number] DOB: 03-05-1960 Married / Language: Cleophus Molt / Race: White Male   History of Present Illness Rodman Key B. Hassell Done MD; 03/14/2016 12:51 PM) Patient words: Surgeon: Kaylyn Lim, MD, FACS Asst: none Anes: General endotracheal Procedure: Open umbilical hernia repair with Proceed circular 4 cm patch Diagnosis: Umbilical hernia Complications: none EBL: minimal cc Description of Procedure: The patient was taken to oh or 3 at [facility] day surgery. General endotracheal anesthesia was administered and the umbilical region was clipped and prepped with PCMX and draped sterilely. A timeout was performed and a curvilinear incision was made infraumbilically. The umbilical skin was raised off this chronically incarcerated umbilical hernia without significantly undermining the skin but getting above the defect. The fascial defect was then freed at the ring level so that it could be reduced below the fascia and a finger inserted and swept around range clear space for the proceed mesh patch. This was then inserted into the hole which was about a centimeter in diameter and deployed using the straps to pull it up against the fascia and then 3 sutures of 0 Prolene were placed fixing the patch to the fascia superiorly and inferiorly and then 2 more sutures were placed medially and laterally to tack it to the fascia all the way around. The fascia was then infiltrated with Exparel and the wound closed in layers with 4-0 Vicryl subcutaneously and tacking the skin to the fascia and then subcuticularly and with Dermabond. Abdominal binder was applied the patient was taken recovery room in satisfactory condition [name] Done, MD, [facility], [place]  Electronically signed by Pedro Earls, MD at 03/25/2012 11:56  AM  He has a recurrence to the left of the other repair. Needs laparoscopic assisted repair. I discussed this with him and Santiago Glad who accompanied him. He has a fishing trip in April. This mass is pushing out and hurting periodically.  The patient is a 56 year old male.   Allergies Malachy Moan, [place]; 03/14/2016 12:05 PM) No Known Allergies 03/14/2016  Medication History Malachy Moan, [place]; 03/14/2016 12:05 PM) Citalopram Hydrobromide (20MG  Tablet, Oral) Active. Omeprazole (20MG  Capsule DR, Oral) Active. RaNITidine HCl (300MG  Tablet, Oral) Active. Simvastatin (20MG  Tablet, Oral) Active. Fluticasone Propionate (50MCG/ACT Suspension, Nasal) Active. Medications Reconciled  Vitals Banks Chaikin RMA; 03/14/2016 12:06 PM) 03/14/2016 12:06 PM Weight: 238.4 lb Height: 68in Body Surface Area: 2.2 m Body Mass Index: 36.25 kg/m  Temp.: 86F  Pulse: 91 (Regular)  BP: 126/74 (Sitting, Left Arm, Standard)       Physical Exam (Zephyr Sausedo B. Hassell Done MD; 03/14/2016 12:53 PM) Abdomen Note: Mass to the left of the old repair      Assessment & Plan Rodman Key B. Hassell Done MD; 03/14/2016 73:22 PM) H/O UMBILICAL HERNIA REPAIR [phone]) Impression: recurrence to the left of the old repair Plan:  Lap assisted repair of ventral hernia-recurrent

## 2016-05-16 NOTE — Anesthesia Postprocedure Evaluation (Addendum)
Anesthesia Post Note  Patient: TOBIAS AVITABILE  Procedure(s) Performed: Procedure(s) (LRB): LAPAROSCOPIC ASSISTED REPAIR OF RECURRENT UMBILICAL HERNIA WITH MESH (N/A)  Patient location during evaluation: PACU Anesthesia Type: General Level of consciousness: awake and alert Pain management: pain level controlled Vital Signs Assessment: post-procedure vital signs reviewed and stable Respiratory status: spontaneous breathing, nonlabored ventilation, respiratory function stable and patient connected to nasal cannula oxygen Cardiovascular status: blood pressure returned to baseline and stable Postop Assessment: no signs of nausea or vomiting Anesthetic complications: no       Last Vitals:  Vitals:   05/16/16 1245 05/16/16 1300  BP: 125/72 103/82  Pulse: 97 91  Resp: 10 12  Temp:      Last Pain:  Vitals:   05/16/16 1300  TempSrc:   PainSc: 2                  Bennetta Rudden S

## 2016-05-16 NOTE — Interval H&P Note (Signed)
History and Physical Interval Note:  05/16/2016 9:14 AM  Connor Thomas  has presented today for surgery, with the diagnosis of Recurrent umbilical hernia  The various methods of treatment have been discussed with the patient and family. After consideration of risks, benefits and other options for treatment, the patient has consented to  Procedure(s): [place] (N/A) as a surgical intervention .  The patient's history has been reviewed, patient examined, no change in status, stable for surgery.  I have reviewed the patient's chart and labs.  Questions were answered to the patient's satisfaction.     [name]

## 2016-05-16 NOTE — Transfer of Care (Signed)
Immediate Anesthesia Transfer of Care Note  Patient: Connor Thomas  Procedure(s) Performed: Procedure(s): LAPAROSCOPIC ASSISTED REPAIR OF RECURRENT UMBILICAL HERNIA WITH MESH (N/A)  Patient Location: PACU  Anesthesia Type:General  Level of Consciousness: awake, alert , oriented and patient cooperative  Airway & Oxygen Therapy: Patient Spontanous Breathing and Patient connected to face mask oxygen  Post-op Assessment: Report given to RN and Post -op Vital signs reviewed and stable  Post vital signs: Reviewed and stable  Last Vitals:  Vitals:   05/16/16 0701  BP: 140/82  Pulse: 78  Resp: 18  Temp: 36.9 C    Last Pain:  Vitals:   05/16/16 0701  TempSrc: Oral      Patients Stated Pain Goal: 3 (73/53/29 [id number])  Complications: No apparent anesthesia complications

## 2016-05-16 NOTE — Discharge Instructions (Signed)
Laparoscopic Ventral Hernia Repair, Care After This sheet gives you information about how to care for yourself after your procedure. Your health care provider may also give you more specific instructions. If you have problems or questions, contact your health care provider. What can I expect after the procedure? After the procedure, it is common to have:  Pain, discomfort, or soreness. Follow these instructions at home: Incision care   Follow instructions from your health care provider about how to take care of your incision. Make sure you:  Wash your hands with soap and water before you change your bandage (dressing) or before you touch your abdomen. If soap and water are not available, use hand sanitizer.  Change your dressing as told by your health care provider.  Leave stitches (sutures), skin glue, or adhesive strips in place. These skin closures may need to stay in place for 2 weeks or longer. If adhesive strip edges start to loosen and curl up, you may trim the loose edges. Do not remove adhesive strips completely unless your health care provider tells you to do that.  Check your incision area every day for signs of infection. Check for:  Redness, swelling, or pain.  Fluid or blood.  Warmth.  Pus or a bad smell. Bathing   Do not take baths, swim, or use a hot tub until your health care provider approves. Ask your health care provider if you can take showers. You may only be allowed to take sponge baths for bathing.  Keep your bandage (dressing) dry until your health care provider says it can be removed. Activity   Do not lift anything that is heavier than 10 lb (4.5 kg) until your health care provider approves.  Do not drive or use heavy machinery while taking prescription pain medicine. Ask your health care provider when it is safe for you to drive or use heavy machinery.  Do not drive for 24 hours if you were given a medicine to help you relax (sedative) during your  procedure.  Rest as told by your health care provider. You may return to your normal activities when your health care provider approves. General instructions   Take over-the-counter and prescription medicines only as told by your health care provider.  To prevent or treat constipation while you are taking prescription pain medicine, your health care provider may recommend that you:  Take over-the-counter or prescription medicines.  Eat foods that are high in fiber, such as fresh fruits and vegetables, whole grains, and beans.  Limit foods that are high in fat and processed sugars, such as fried and sweet foods.  Drink enough fluid to keep your urine clear or pale yellow.  Hold a pillow over your abdomen when you cough or sneeze. This helps with pain.  Keep all follow-up visits as told by your health care provider. This is important. Contact a health care provider if:  You have:  A fever or chills.  Redness, swelling, or pain around your incision.  Fluid or blood coming from your incision.  Pus or a bad smell coming from your incision.  Pain that gets worse or does not get better with medicine.  Nausea or vomiting.  A cough.  Shortness of breath.  Your incision feels warm to the touch.  You have not had a bowel movement in three days.  You are not able to urinate. Get help right away if:  You have severe pain in your abdomen.  You have persistent nausea and vomiting.  You have redness, warmth, or pain in your leg.  You have chest pain.  You have trouble breathing. Summary  After this procedure, it is common to have pain, discomfort, or soreness.  Follow instructions from your health care provider about how to take care of your incision.  Check your incision area every day for signs of infection. Report any signs of infection to your health care provider.  Keep all follow-up visits as told by your health care provider. This is important. This information  is not intended to replace advice given to you by your health care provider. Make sure you discuss any questions you have with your health care provider. Document Released: 12/25/2011 Document Revised: 08/30/2015 Document Reviewed: 08/30/2015 Elsevier Interactive Patient Education  2017 [place] Anesthesia, Adult, Care After These instructions provide you with information about caring for yourself after your procedure. Your health care provider may also give you more specific instructions. Your treatment has been planned according to current medical practices, but problems sometimes occur. Call your health care provider if you have any problems or questions after your procedure. What can I expect after the procedure? After the procedure, it is common to have:  Vomiting.  A sore throat.  Mental slowness. It is common to feel:  Nauseous.  Cold or shivery.  Sleepy.  Tired.  Sore or achy, even in parts of your body where you did not have surgery. Follow these instructions at home: For at least 24 hours after the procedure:   Do not:  Participate in activities where you could fall or become injured.  Drive.  Use heavy machinery.  Drink alcohol.  Take sleeping pills or medicines that cause drowsiness.  Make important decisions or sign legal documents.  Take care of children on your own.  Rest. Eating and drinking   If you vomit, drink water, juice, or soup when you can drink without vomiting.  Drink enough fluid to keep your urine clear or pale yellow.  Make sure you have little or no nausea before eating solid foods.  Follow the diet recommended by your health care provider. General instructions   Have a responsible adult stay with you until you are awake and alert.  Return to your normal activities as told by your health care provider. Ask your health care provider what activities are safe for you.  Take over-the-counter and prescription medicines  only as told by your health care provider.  If you smoke, do not smoke without supervision.  Keep all follow-up visits as told by your health care provider. This is important. Contact a health care provider if:  You continue to have nausea or vomiting at home, and medicines are not helpful.  You cannot drink fluids or start eating again.  You cannot urinate after 8-12 hours.  You develop a skin rash.  You have fever.  You have increasing redness at the site of your procedure. Get help right away if:  You have difficulty breathing.  You have chest pain.  You have unexpected bleeding.  You feel that you are having a life-threatening or urgent problem. This information is not intended to replace advice given to you by your health care provider. Make sure you discuss any questions you have with your health care provider. Document Released: 04/15/2000 Document Revised: 06/12/2015 Document Reviewed: 12/22/2014 Elsevier Interactive Patient Education  2017 [employer].

## 2016-06-08 ENCOUNTER — Other Ambulatory Visit: Payer: Self-pay | Admitting: Family Medicine

## 2016-06-08 DIAGNOSIS — J302 Other seasonal allergic rhinitis: Secondary | ICD-10-CM

## 2016-06-24 NOTE — Addendum Note (Signed)
Addendum  created 06/24/16 1352 by Myrtie Soman, MD   Sign clinical note

## 2016-08-12 ENCOUNTER — Encounter: Payer: Self-pay | Admitting: Family Medicine

## 2016-08-12 ENCOUNTER — Ambulatory Visit (INDEPENDENT_AMBULATORY_CARE_PROVIDER_SITE_OTHER): Payer: 59 | Admitting: Family Medicine

## 2016-08-12 VITALS — BP 138/88 | HR 71 | Temp 98.8°F | Ht 68.0 in | Wt 239.0 lb

## 2016-08-12 DIAGNOSIS — Z0000 Encounter for general adult medical examination without abnormal findings: Secondary | ICD-10-CM | POA: Diagnosis not present

## 2016-08-12 LAB — BASIC METABOLIC PANEL
BUN: 14 mg/dL (ref 6–23)
CHLORIDE: 101 meq/L (ref 96–112)
CO2: 28 mEq/L (ref 19–32)
Calcium: 9.5 mg/dL (ref 8.4–10.5)
Creatinine, Ser: 0.88 mg/dL (ref 0.40–1.50)
GFR: 95.13 mL/min (ref >60.00)
Glucose, Bld: 106 mg/dL — ABNORMAL HIGH (ref 70–99)
Potassium: 4.5 mEq/L (ref 3.5–5.1)
Sodium: 137 mEq/L (ref 135–145)

## 2016-08-12 LAB — POC URINALSYSI DIPSTICK (AUTOMATED)
Bilirubin, UA: NEGATIVE
Glucose, UA: NEGATIVE
Ketones, UA: NEGATIVE
PH UA: 6 (ref 5.0–8.0)
Protein, UA: NEGATIVE
RBC UA: NEGATIVE
Spec Grav, UA: >=1.03 — AB (ref 1.010–1.025)
UROBILINOGEN UA: NEGATIVE U/dL — AB

## 2016-08-12 LAB — LIPID PANEL
CHOL/HDL RATIO: 4
Cholesterol: 173 mg/dL (ref 0–200)
HDL: 44.1 mg/dL (ref >39.00)
LDL CALC: 106 mg/dL — AB (ref 0–99)
NONHDL: 129.29
TRIGLYCERIDES: 115 mg/dL (ref 0.0–149.0)
VLDL: 23 mg/dL (ref 0.0–40.0)

## 2016-08-12 LAB — HEPATIC FUNCTION PANEL
ALBUMIN: 4.5 g/dL (ref 3.5–5.2)
ALT: 26 U/L (ref 0–53)
AST: 16 U/L (ref 0–37)
Alkaline Phosphatase: 74 U/L (ref 39–117)
BILIRUBIN TOTAL: 0.4 mg/dL (ref 0.2–1.2)
Bilirubin, Direct: 0.1 mg/dL (ref 0.0–0.3)
Total Protein: 6.9 g/dL (ref 6.0–8.3)

## 2016-08-12 LAB — CBC WITH DIFFERENTIAL/PLATELET
BASOS ABS: 0.1 10*3/uL (ref 0.0–0.1)
Basophils Relative: 0.9 % (ref 0.0–3.0)
EOS ABS: 0.3 10*3/uL (ref 0.0–0.7)
Eosinophils Relative: 3.4 % (ref 0.0–5.0)
HEMATOCRIT: 43.1 % (ref 39.0–52.0)
HEMOGLOBIN: 14.4 g/dL (ref 13.0–17.0)
LYMPHS PCT: 36.3 % (ref 12.0–46.0)
Lymphs Abs: 3 10*3/uL (ref 0.7–4.0)
MCHC: 33.4 g/dL (ref 30.0–36.0)
MCV: 90.9 fl (ref 78.0–100.0)
MONOS PCT: 7.6 % (ref 3.0–12.0)
Monocytes Absolute: 0.6 10*3/uL (ref 0.1–1.0)
Neutro Abs: 4.3 10*3/uL (ref 1.4–7.7)
Neutrophils Relative %: 51.8 % (ref 43.0–77.0)
PLATELETS: 327 10*3/uL (ref 150.0–400.0)
RBC: 4.74 Mil/uL (ref 4.22–5.81)
RDW: 13.2 % (ref 11.5–15.5)
WBC: 8.4 10*3/uL (ref 4.0–10.5)

## 2016-08-12 LAB — TSH: TSH: 1.39 u[IU]/mL (ref 0.35–4.50)

## 2016-08-12 LAB — PSA: PSA: 0.57 ng/mL (ref 0.10–4.00)

## 2016-08-12 MED ORDER — ALBUTEROL SULFATE HFA 108 (90 BASE) MCG/ACT IN AERS: 2.0000 | INHALATION_SPRAY | Freq: Four times a day (QID) | RESPIRATORY_TRACT | 5 refills | Status: DC | PRN

## 2016-08-12 MED ORDER — VARENICLINE TARTRATE 1 MG PO TABS: 1.0000 mg | ORAL_TABLET | Freq: Two times a day (BID) | ORAL | 5 refills | Status: DC

## 2016-08-12 NOTE — Patient Instructions (Signed)
WE NOW OFFER   [facility] FAST TRACK!!!  SAME DAY Appointments for ACUTE CARE  Such as: Sprains, Injuries, cuts, abrasions, rashes, muscle pain, joint pain, back pain Colds, flu, sore throats, headache, allergies, cough, fever  Ear pain, sinus and eye infections Abdominal pain, nausea, vomiting, diarrhea, upset stomach Animal/insect bites  3 Easy Ways to Schedule: Walk-In Scheduling Call in scheduling Mychart Sign-up: [email or web address]         

## 2016-08-12 NOTE — Progress Notes (Signed)
   Subjective:    Patient ID: Connor Thomas, male    DOB: 1960-11-05, 56 y.o.   MRN: [id number]  HPI Here for a well exam. He feels fine. He is still trying to quit smoking.    Review of Systems  Constitutional: Negative.   HENT: Negative.   Eyes: Negative.   Respiratory: Negative.   Cardiovascular: Negative.   Gastrointestinal: Negative.   Genitourinary: Negative.   Musculoskeletal: Negative.   Skin: Negative.   Neurological: Negative.   Psychiatric/Behavioral: Negative.        Objective:   Physical Exam  Constitutional: He is oriented to person, place, and time. He appears well-developed and well-nourished. No distress.  HENT:  Head: Normocephalic and atraumatic.  Right Ear: External ear normal.  Left Ear: External ear normal.  Nose: Nose normal.  Mouth/Throat: Oropharynx is clear and moist. No oropharyngeal exudate.  Eyes: Pupils are equal, round, and reactive to light. Conjunctivae and EOM are normal. Right eye exhibits no discharge. Left eye exhibits no discharge. No scleral icterus.  Neck: Neck supple. No JVD present. No tracheal deviation present. No thyromegaly present.  Cardiovascular: Normal rate, regular rhythm, normal heart sounds and intact distal pulses.  Exam reveals no gallop and no friction rub.   No murmur heard. Pulmonary/Chest: Effort normal and breath sounds normal. No respiratory distress. He has no wheezes. He has no rales. He exhibits no tenderness.  Abdominal: Soft. Bowel sounds are normal. He exhibits no distension and no mass. There is no tenderness. There is no rebound and no guarding.  Genitourinary: Rectum normal, prostate normal and penis normal. Rectal exam shows guaiac negative stool. No penile tenderness.  Musculoskeletal: Normal range of motion. He exhibits no edema or tenderness.  Lymphadenopathy:    He has no cervical adenopathy.  Neurological: He is alert and oriented to person, place, and time. He has normal reflexes. No cranial nerve  deficit. He exhibits normal muscle tone. Coordination normal.  Skin: Skin is warm and dry. No rash noted. He is not diaphoretic. No erythema. No pallor.  Psychiatric: He has a normal mood and affect. His behavior is normal. Judgment and thought content normal.          Assessment & Plan:  Well exam. We discussed diet and exercise. Get fasting labs.  [name], MD

## 2016-10-02 DIAGNOSIS — R05 Cough: Secondary | ICD-10-CM | POA: Diagnosis not present

## 2016-10-02 DIAGNOSIS — J029 Acute pharyngitis, unspecified: Secondary | ICD-10-CM | POA: Diagnosis not present

## 2016-10-28 ENCOUNTER — Other Ambulatory Visit: Payer: Self-pay | Admitting: Family Medicine

## 2017-01-06 DIAGNOSIS — J069 Acute upper respiratory infection, unspecified: Secondary | ICD-10-CM | POA: Diagnosis not present

## 2017-04-18 ENCOUNTER — Encounter: Payer: Self-pay | Admitting: Family Medicine

## 2017-04-18 ENCOUNTER — Ambulatory Visit: Payer: 59 | Admitting: Family Medicine

## 2017-04-18 VITALS — BP 122/64 | HR 79 | Temp 98.6°F | Ht 68.0 in | Wt 231.4 lb

## 2017-04-18 DIAGNOSIS — L02229 Furuncle of trunk, unspecified: Secondary | ICD-10-CM

## 2017-04-18 MED ORDER — DOXYCYCLINE HYCLATE 100 MG PO CAPS: 100.0000 mg | ORAL_CAPSULE | Freq: Two times a day (BID) | ORAL | 0 refills | Status: AC

## 2017-04-18 NOTE — Progress Notes (Signed)
   Subjective:    Patient ID: Connor Thomas, male    DOB: 1960/07/26, 57 y.o.   MRN: [id number]  HPI Here for 5 days of a tender lump on the left flank. No fever.    Review of Systems  Constitutional: Negative.   Respiratory: Negative.   Cardiovascular: Negative.   Neurological: Negative.        Objective:   Physical Exam  Constitutional: He appears well-developed and well-nourished.  Cardiovascular: Normal rate, regular rhythm, normal heart sounds and intact distal pulses.  Pulmonary/Chest: Effort normal and breath sounds normal. No respiratory distress. He has no wheezes. He has no rales.  Skin:  There is a 1 cm tender lump under the skin on the left flank          Assessment & Plan:  Boil, treat with Doxycycline and warm compresses.  [name], MD

## 2017-10-03 ENCOUNTER — Ambulatory Visit (INDEPENDENT_AMBULATORY_CARE_PROVIDER_SITE_OTHER): Payer: 59 | Admitting: Family Medicine

## 2017-10-03 ENCOUNTER — Encounter: Payer: Self-pay | Admitting: Family Medicine

## 2017-10-03 VITALS — BP 120/84 | HR 80 | Temp 98.6°F | Ht 67.5 in | Wt 231.2 lb

## 2017-10-03 DIAGNOSIS — Z23 Encounter for immunization: Secondary | ICD-10-CM

## 2017-10-03 DIAGNOSIS — Z0000 Encounter for general adult medical examination without abnormal findings: Secondary | ICD-10-CM

## 2017-10-03 DIAGNOSIS — Z125 Encounter for screening for malignant neoplasm of prostate: Secondary | ICD-10-CM

## 2017-10-03 LAB — TSH: TSH: 1.56 u[IU]/mL (ref 0.35–4.50)

## 2017-10-03 LAB — CBC WITH DIFFERENTIAL/PLATELET
Basophils Absolute: 0.1 10*3/uL (ref 0.0–0.1)
Basophils Relative: 1.1 % (ref 0.0–3.0)
Eosinophils Absolute: 0.4 10*3/uL (ref 0.0–0.7)
Eosinophils Relative: 4.5 % (ref 0.0–5.0)
HEMATOCRIT: 44.9 % (ref 39.0–52.0)
Hemoglobin: 15.5 g/dL (ref 13.0–17.0)
LYMPHS ABS: 2.9 10*3/uL (ref 0.7–4.0)
LYMPHS PCT: 35.9 % (ref 12.0–46.0)
MCHC: 34.5 g/dL (ref 30.0–36.0)
MCV: 91.4 fl (ref 78.0–100.0)
MONOS PCT: 8.3 % (ref 3.0–12.0)
Monocytes Absolute: 0.7 10*3/uL (ref 0.1–1.0)
NEUTROS ABS: 4.1 10*3/uL (ref 1.4–7.7)
NEUTROS PCT: 50.2 % (ref 43.0–77.0)
Platelets: 325 10*3/uL (ref 150.0–400.0)
RBC: 4.92 Mil/uL (ref 4.22–5.81)
RDW: 13.2 % (ref 11.5–15.5)
WBC: 8.1 10*3/uL (ref 4.0–10.5)

## 2017-10-03 LAB — BASIC METABOLIC PANEL
BUN: 17 mg/dL (ref 6–23)
CO2: 29 mEq/L (ref 19–32)
Calcium: 9.4 mg/dL (ref 8.4–10.5)
Chloride: 101 mEq/L (ref 96–112)
Creatinine, Ser: 0.89 mg/dL (ref 0.40–1.50)
GFR: 93.51 mL/min (ref >60.00)
Glucose, Bld: 104 mg/dL — ABNORMAL HIGH (ref 70–99)
Potassium: 4.5 mEq/L (ref 3.5–5.1)
Sodium: 140 mEq/L (ref 135–145)

## 2017-10-03 LAB — POC URINALSYSI DIPSTICK (AUTOMATED)
Bilirubin, UA: NEGATIVE
Glucose, UA: NEGATIVE
KETONES UA: NEGATIVE
LEUKOCYTES UA: NEGATIVE
Nitrite, UA: NEGATIVE
Protein, UA: NEGATIVE
Spec Grav, UA: 1.025 (ref 1.010–1.025)
Urobilinogen, UA: 0.2 E.U./dL
pH, UA: 6 (ref 5.0–8.0)

## 2017-10-03 LAB — HEPATIC FUNCTION PANEL
ALK PHOS: 65 U/L (ref 39–117)
ALT: 25 U/L (ref 0–53)
AST: 18 U/L (ref 0–37)
Albumin: 4.5 g/dL (ref 3.5–5.2)
BILIRUBIN TOTAL: 0.6 mg/dL (ref 0.2–1.2)
Bilirubin, Direct: 0.1 mg/dL (ref 0.0–0.3)
Total Protein: 7.1 g/dL (ref 6.0–8.3)

## 2017-10-03 LAB — PSA: PSA: 0.54 ng/mL (ref 0.10–4.00)

## 2017-10-03 LAB — LIPID PANEL
CHOL/HDL RATIO: 5
Cholesterol: 256 mg/dL — ABNORMAL HIGH (ref 0–200)
HDL: 48.7 mg/dL (ref >39.00)
LDL CALC: 171 mg/dL — AB (ref 0–99)
NonHDL: 206.83
TRIGLYCERIDES: 178 mg/dL — AB (ref 0.0–149.0)
VLDL: 35.6 mg/dL (ref 0.0–40.0)

## 2017-10-03 MED ORDER — VARENICLINE TARTRATE 0.5 MG X 11 & 1 MG X 42 PO MISC: ORAL | 0 refills | Status: DC

## 2017-10-03 MED ORDER — OMEPRAZOLE 20 MG PO CPDR: 20.0000 mg | DELAYED_RELEASE_CAPSULE | Freq: Every day | ORAL | 3 refills | Status: DC

## 2017-10-03 MED ORDER — VARENICLINE TARTRATE 1 MG PO TABS
1.0000 mg | ORAL_TABLET | Freq: Two times a day (BID) | ORAL | 5 refills | Status: DC
Start: 2017-10-03 — End: 2018-06-02

## 2017-10-03 MED ORDER — CITALOPRAM HYDROBROMIDE 20 MG PO TABS: 20.0000 mg | ORAL_TABLET | Freq: Every day | ORAL | 3 refills | Status: DC

## 2017-10-03 MED ORDER — ACYCLOVIR 200 MG PO CAPS: 200.0000 mg | ORAL_CAPSULE | Freq: Every day | ORAL | 3 refills | Status: DC

## 2017-10-03 MED ORDER — SILDENAFIL CITRATE 20 MG PO TABS: 20.0000 mg | ORAL_TABLET | Freq: Three times a day (TID) | ORAL | 10 refills | Status: DC

## 2017-10-03 MED ORDER — ALBUTEROL SULFATE HFA 108 (90 BASE) MCG/ACT IN AERS: 2.0000 | INHALATION_SPRAY | RESPIRATORY_TRACT | 5 refills | Status: DC | PRN

## 2017-10-03 NOTE — Progress Notes (Signed)
   Subjective:    Patient ID: Connor Thomas, male    DOB: Aug 23, 1960, 57 y.o.   MRN: [id number]  HPI Here for a well exam. He feels well. He plans to lose some weight. He quit smoking for awhile but then started back. He wants to use Chantix again.    Review of Systems  Constitutional: Negative.   HENT: Negative.   Eyes: Negative.   Respiratory: Negative.   Cardiovascular: Negative.   Gastrointestinal: Negative.   Genitourinary: Negative.   Musculoskeletal: Negative.   Skin: Negative.   Neurological: Negative.   Psychiatric/Behavioral: Negative.        Objective:   Physical Exam  Constitutional: He is oriented to person, place, and time. He appears well-developed and well-nourished. No distress.  HENT:  Head: Normocephalic and atraumatic.  Right Ear: External ear normal.  Left Ear: External ear normal.  Nose: Nose normal.  Mouth/Throat: Oropharynx is clear and moist. No oropharyngeal exudate.  Eyes: Pupils are equal, round, and reactive to light. Conjunctivae and EOM are normal. Right eye exhibits no discharge. Left eye exhibits no discharge. No scleral icterus.  Neck: Neck supple. No JVD present. No tracheal deviation present. No thyromegaly present.  Cardiovascular: Normal rate, regular rhythm, normal heart sounds and intact distal pulses. Exam reveals no gallop and no friction rub.  No murmur heard. Pulmonary/Chest: Effort normal and breath sounds normal. No respiratory distress. He has no wheezes. He has no rales. He exhibits no tenderness.  Abdominal: Soft. Bowel sounds are normal. He exhibits no distension and no mass. There is no tenderness. There is no rebound and no guarding.  Genitourinary: Rectum normal, prostate normal and penis normal. Rectal exam shows guaiac negative stool. No penile tenderness.  Musculoskeletal: Normal range of motion. He exhibits no edema or tenderness.  Lymphadenopathy:    He has no cervical adenopathy.  Neurological: He is alert and oriented  to person, place, and time. He has normal reflexes. He displays normal reflexes. No cranial nerve deficit. He exhibits normal muscle tone. Coordination normal.  Skin: Skin is warm and dry. No rash noted. He is not diaphoretic. No erythema. No pallor.  Psychiatric: He has a normal mood and affect. His behavior is normal. Judgment and thought content normal.          Assessment & Plan:  Well exam. We discussed diet and exercise. Get fasting labs.  [name], MD

## 2017-11-25 ENCOUNTER — Other Ambulatory Visit: Payer: Self-pay | Admitting: Family Medicine

## 2017-11-25 NOTE — Telephone Encounter (Signed)
Dr Fry pt 

## 2017-12-02 NOTE — Telephone Encounter (Signed)
Dr Fry pt 

## 2017-12-04 ENCOUNTER — Other Ambulatory Visit: Payer: Self-pay

## 2018-01-09 DIAGNOSIS — J40 Bronchitis, not specified as acute or chronic: Secondary | ICD-10-CM | POA: Diagnosis not present

## 2018-01-12 ENCOUNTER — Other Ambulatory Visit (INDEPENDENT_AMBULATORY_CARE_PROVIDER_SITE_OTHER): Payer: 59

## 2018-01-12 DIAGNOSIS — E785 Hyperlipidemia, unspecified: Secondary | ICD-10-CM

## 2018-01-12 LAB — LIPID PANEL
CHOL/HDL RATIO: 4
Cholesterol: 210 mg/dL — ABNORMAL HIGH (ref 0–200)
HDL: 52.5 mg/dL (ref >39.00)
LDL Cholesterol: 126 mg/dL — ABNORMAL HIGH (ref 0–99)
NonHDL: 157.85
TRIGLYCERIDES: 161 mg/dL — AB (ref 0.0–149.0)
VLDL: 32.2 mg/dL (ref 0.0–40.0)

## 2018-01-26 ENCOUNTER — Ambulatory Visit: Payer: 59 | Admitting: Family Medicine

## 2018-05-04 ENCOUNTER — Encounter: Payer: Self-pay | Admitting: Family Medicine

## 2018-05-04 DIAGNOSIS — J302 Other seasonal allergic rhinitis: Secondary | ICD-10-CM

## 2018-05-04 MED ORDER — FLUTICASONE PROPIONATE 50 MCG/ACT NA SUSP: NASAL | 11 refills | Status: DC

## 2018-06-02 ENCOUNTER — Ambulatory Visit (INDEPENDENT_AMBULATORY_CARE_PROVIDER_SITE_OTHER): Payer: 59 | Admitting: Family Medicine

## 2018-06-02 ENCOUNTER — Other Ambulatory Visit: Payer: Self-pay

## 2018-06-02 ENCOUNTER — Ambulatory Visit: Payer: Self-pay

## 2018-06-02 ENCOUNTER — Encounter: Payer: Self-pay | Admitting: Family Medicine

## 2018-06-02 DIAGNOSIS — R0789 Other chest pain: Secondary | ICD-10-CM | POA: Diagnosis not present

## 2018-06-02 MED ORDER — DICLOFENAC SODIUM 75 MG PO TBEC: 75.0000 mg | DELAYED_RELEASE_TABLET | Freq: Two times a day (BID) | ORAL | 2 refills | Status: DC

## 2018-06-02 NOTE — Telephone Encounter (Signed)
Patient called and says he's having left side chest pain to his ribs around to the left side of his back. He says he doesn't remember if he pulled a muscle, but feels like it's muscular. He says he takes Ibuprofen and the pain eases to a 1, otherwise it's a moderate pain, it's there when he moves a certain way and hard to get his breath when the pain catches. He says he had a respiratory infection with coughing and the cough is still lingering, so it may be from coughing. He denies any other symptoms. I called the office and spoke to [facility], CMA who asked to speak to the patient, the call was connected successfully.  Answer Assessment - Initial Assessment Questions 1. LOCATION: "Where does it hurt?"       Left side around the rib cage 2. RADIATION: "Does the pain go anywhere else?" (e.g., into neck, jaw, arms, back)     Around to the left side of back 3. ONSET: "When did the chest pain begin?" (Minutes, hours or days)      1 week 4. PATTERN "Does the pain come and go, or has it been constant since it started?"  "Does it get worse with exertion?"      Constant, taking Ibuprofen relieves the pain; Gets worse with exertion 5. DURATION: "How long does it last" (e.g., seconds, minutes, hours)     Constantly there 6. SEVERITY: "How bad is the pain?"  (e.g., Scale 1-10; mild, moderate, or severe)    - MILD (1-3): doesn't interfere with normal activities     - MODERATE (4-7): interferes with normal activities or awakens from sleep    - SEVERE (8-10): excruciating pain, unable to do any normal activities      Mild to moderate 7. CARDIAC RISK FACTORS: "Do you have any history of heart problems or risk factors for heart disease?" (e.g., prior heart attack, angina; high blood pressure, diabetes, being overweight, high cholesterol, smoking, or strong family history of heart disease)    Overweight, high cholesterol, family history of heart disease 8. PULMONARY RISK FACTORS: "Do you have any history of lung  disease?"  (e.g., blood clots in lung, asthma, emphysema, birth control pills)     No 9. CAUSE: "What do you think is causing the chest pain?"     I don't know 10. OTHER SYMPTOMS: "Do you have any other symptoms?" (e.g., dizziness, nausea, vomiting, sweating, fever, difficulty breathing, cough)      Cough, difficulty breathing when the pain is there 11. PREGNANCY: "Is there any chance you are pregnant?" "When was your last menstrual period?"      N/A  Protocols used: CHEST PAIN-A-AH

## 2018-06-02 NOTE — Progress Notes (Signed)
Subjective:    Patient ID: Connor Thomas, male    DOB: May 23, 1960, 58 y.o.   MRN: [id number]  HPI Virtual Visit via Video Note  I connected with the patient on 06/02/18 at 10:30 AM EDT by a video enabled telemedicine application and verified that I am speaking with the correct person using two identifiers.  Location patient: home Location provider:work or home office Persons participating in the virtual visit: patient, provider  I discussed the limitations of evaluation and management by telemedicine and the availability of in person appointments. The patient expressed understanding and agreed to proceed.   HPI: Here for one week of a sharp pain in the left anterior chest area near the nipple. No SOB or cough or fever. This area is tender to the touch and taking a deep breath or raising his left arm makes the pain worse. No recent trauma. Ibuprofen helps for a short time.    ROS: See pertinent positives and negatives per HPI.  Past Medical History:  Diagnosis Date  . Allergy   . Cancer ([place])    melanoma  . Ganglion of right wrist   . GERD (gastroesophageal reflux disease)   . Hernia, inguinal   . Hyperlipidemia   . 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 COLONOSCOPY  04/11/2016   per Dr. Havery Moros, adenomatous polyps, repeat in 5 yrs   . ESOPHAGOGASTRODUODENOSCOPY  04/25/06  . HERNIA REPAIR    . left knee scope    . TONSILLECTOMY AND ADENOIDECTOMY    . UMBILICAL HERNIA REPAIR N/A 03/25/2012   Procedure: HERNIA REPAIR-- UMBILICAL-- ADULT;  Surgeon: Pedro Earls, MD;  Location: [place];  Service: General;  Laterality: N/A;  . UMBILICAL HERNIA REPAIR N/A 05/16/2016   Procedure: LAPAROSCOPIC ASSISTED REPAIR OF RECURRENT UMBILICAL HERNIA WITH MESH;  Surgeon: Johnathan Hausen, MD;  Location: [facility];  Service: General;  Laterality: N/A;    Family History  Problem Relation Age of Onset  . Colon cancer Neg  Hx      Current Outpatient Medications:  .  acyclovir (ZOVIRAX) 200 MG capsule, Take 1 capsule (200 mg total) by mouth 5 (five) times daily., Disp: 60 capsule, Rfl: 3 .  albuterol (PROVENTIL HFA) 108 (90 Base) MCG/ACT inhaler, Inhale 2 puffs into the lungs every 4 (four) hours as needed for wheezing or shortness of breath., Disp: 18 g, Rfl: 5 .  aspirin 81 MG tablet, Take 81 mg by mouth daily.  , Disp: , Rfl:  .  citalopram (CELEXA) 20 MG tablet, Take 1 tablet (20 mg total) by mouth daily., Disp: 90 tablet, Rfl: 3 .  fluticasone (FLONASE) 50 MCG/ACT nasal spray, USE TWO SPRAY(S) IN EACH NOSTRIL ONCE DAILY, Disp: 16 g, Rfl: 11 .  loratadine (CLARITIN) 10 MG tablet, Take 10 mg by mouth daily., Disp: , Rfl:  .  omeprazole (PRILOSEC) 20 MG capsule, Take 1 capsule (20 mg total) by mouth daily., Disp: 90 capsule, Rfl: 3 .  sildenafil (REVATIO) 20 MG tablet, Take 1 tablet (20 mg total) by mouth 3 (three) times daily., Disp: 20 tablet, Rfl: 10 .  simvastatin (ZOCOR) 40 MG tablet, TAKE 1 TABLET BY MOUTH AT BEDTIME, Disp: 31 tablet, Rfl: 11 .  diclofenac (VOLTAREN) 75 MG EC tablet, Take 1 tablet (75 mg total) by mouth 2 (two) times daily., Disp: 60 tablet, Rfl: 2  EXAM:  VITALS per patient if applicable:  GENERAL: alert,  oriented, appears well and in no acute distress  HEENT: atraumatic, conjunttiva clear, no obvious abnormalities on inspection of external nose and ears  NECK: normal movements of the head and neck  LUNGS: on inspection no signs of respiratory distress, breathing rate appears normal, no obvious gross SOB, gasping or wheezing  CV: no obvious cyanosis  MS: moves all visible extremities without noticeable abnormality  PSYCH/NEURO: pleasant and cooperative, no obvious depression or anxiety, speech and thought processing grossly intact  ASSESSMENT AND PLAN: Chest wall pain. Apply moist heat. Try Diclofenac prn. This should heal in the next 2-6 weeks. Recheck prn.  [name], MD   Discussed the following assessment and plan:  No diagnosis found.     I discussed the assessment and treatment plan with the patient. The patient was provided an opportunity to ask questions and all were answered. The patient agreed with the plan and demonstrated an understanding of the instructions.   The patient was advised to call back or seek an in-person evaluation if the symptoms worsen or if the condition fails to improve as anticipated.     Review of Systems     Objective:   Physical Exam        Assessment & Plan:

## 2018-06-02 NOTE — Telephone Encounter (Signed)
Pt is scheduled for an appointment.

## 2018-09-22 ENCOUNTER — Other Ambulatory Visit: Payer: Self-pay | Admitting: Emergency Medicine

## 2018-09-22 DIAGNOSIS — Z20822 Contact with and (suspected) exposure to covid-19: Secondary | ICD-10-CM

## 2018-09-24 ENCOUNTER — Encounter: Payer: Self-pay | Admitting: Family Medicine

## 2018-09-24 ENCOUNTER — Telehealth: Payer: Self-pay | Admitting: Family Medicine

## 2018-09-24 ENCOUNTER — Other Ambulatory Visit: Payer: Self-pay

## 2018-09-24 ENCOUNTER — Telehealth (INDEPENDENT_AMBULATORY_CARE_PROVIDER_SITE_OTHER): Payer: 59 | Admitting: Family Medicine

## 2018-09-24 DIAGNOSIS — Z20822 Contact with and (suspected) exposure to covid-19: Secondary | ICD-10-CM

## 2018-09-24 DIAGNOSIS — J069 Acute upper respiratory infection, unspecified: Secondary | ICD-10-CM | POA: Diagnosis not present

## 2018-09-24 LAB — NOVEL CORONAVIRUS, NAA: SARS-CoV-2, NAA: NOT DETECTED

## 2018-09-24 MED ORDER — AZITHROMYCIN 250 MG PO TABS: ORAL_TABLET | ORAL | 0 refills | Status: DC

## 2018-09-24 NOTE — Telephone Encounter (Signed)
Patient sent MyChart message and then called the office and scheduled an appointment.

## 2018-09-24 NOTE — Progress Notes (Signed)
Virtual Visit via Video Note  I connected with the patient on 09/24/18 at 11:00 AM EDT by a video enabled telemedicine application and verified that I am speaking with the correct person using two identifiers.  Location patient: home Location provider:work or home office Persons participating in the virtual visit: patient, provider  I discussed the limitations of evaluation and management by telemedicine and the availability of in person appointments. The patient expressed understanding and agreed to proceed.   HPI: Here for some worsening URI symptoms. For the past week he has had low grade fevers to 99 degrees and a dry cough. Now for 2 days the cough has gotten deeper, he feels SOB,and he has body aches. No headache or ST or chest pain. No NVD. He is drinking fluids and taking Tylenol. He was tested for the Covid-19 virus on 09-22-18 and this was negative. However his wife, who has had head congestion and a ST, tested positive for the Covid virus. This result came back this morning. As a result Tymothy got retested this morning.    ROS: See pertinent positives and negatives per HPI.  Past Medical History:  Diagnosis Date  . Allergy   . Cancer (Connor Thomas)    melanoma  . Ganglion of right wrist   . GERD (gastroesophageal reflux disease)   . Hernia, inguinal   . Hyperlipidemia   . Melanoma in situ of nose (Connor Thomas) 11/2012    Past Surgical History:  Procedure Laterality Date  . COLONOSCOPY  04/11/2016   per Dr. Havery Moros, adenomatous polyps, repeat in 5 yrs   . ESOPHAGOGASTRODUODENOSCOPY  04/25/06  . HERNIA REPAIR    . left knee scope    . TONSILLECTOMY AND ADENOIDECTOMY    . UMBILICAL HERNIA REPAIR N/A 03/25/2012   Procedure: HERNIA REPAIR-- UMBILICAL-- ADULT;  Surgeon: Connor Earls, MD;  Location: [place];  Service: General;  Laterality: N/A;  . UMBILICAL HERNIA REPAIR N/A 05/16/2016   Procedure: LAPAROSCOPIC ASSISTED REPAIR OF RECURRENT UMBILICAL HERNIA WITH MESH;   Surgeon: Connor Hausen, MD;  Location: [facility];  Service: General;  Laterality: N/A;    Family History  Problem Relation Age of Onset  . Colon cancer Neg Hx      Current Outpatient Medications:  .  acyclovir (ZOVIRAX) 200 MG capsule, Take 1 capsule (200 mg total) by mouth 5 (five) times daily., Disp: 60 capsule, Rfl: 3 .  albuterol (PROVENTIL HFA) 108 (90 Base) MCG/ACT inhaler, Inhale 2 puffs into the lungs every 4 (four) hours as needed for wheezing or shortness of breath., Disp: 18 g, Rfl: 5 .  aspirin 81 MG tablet, Take 81 mg by mouth daily.  , Disp: , Rfl:  .  azithromycin (ZITHROMAX Z-PAK) 250 MG tablet, As directed, Disp: 6 each, Rfl: 0 .  citalopram (CELEXA) 20 MG tablet, Take 1 tablet (20 mg total) by mouth daily., Disp: 90 tablet, Rfl: 3 .  diclofenac (VOLTAREN) 75 MG EC tablet, Take 1 tablet (75 mg total) by mouth 2 (two) times daily., Disp: 60 tablet, Rfl: 2 .  fluticasone (FLONASE) 50 MCG/ACT nasal spray, USE TWO SPRAY(S) IN EACH NOSTRIL ONCE DAILY, Disp: 16 g, Rfl: 11 .  loratadine (CLARITIN) 10 MG tablet, Take 10 mg by mouth daily., Disp: , Rfl:  .  omeprazole (PRILOSEC) 20 MG capsule, Take 1 capsule (20 mg total) by mouth daily., Disp: 90 capsule, Rfl: 3 .  sildenafil (REVATIO) 20 MG tablet, Take 1 tablet (20 mg total) by mouth 3 (three)  times daily., Disp: 20 tablet, Rfl: 10 .  simvastatin (ZOCOR) 40 MG tablet, TAKE 1 TABLET BY MOUTH AT BEDTIME, Disp: 31 tablet, Rfl: 11  EXAM:  VITALS per patient if applicable:  GENERAL: alert, oriented, appears well and in no acute distress  HEENT: atraumatic, conjunttiva clear, no obvious abnormalities on inspection of external nose and ears  NECK: normal movements of the head and neck  LUNGS: on inspection no signs of respiratory distress, breathing rate appears normal, no obvious gross SOB, gasping or wheezing  CV: no obvious cyanosis  MS: moves all visible extremities without noticeable abnormality  PSYCH/NEURO: pleasant and  cooperative, no obvious depression or anxiety, speech and thought processing grossly intact  ASSESSMENT AND PLAN: He has an URI and he is likely infected with the Covid virus. We will treat him with a Zpack for possible bronchitis. He and his wife will be quarantining themselves at home. Recheck prn.  Connor Penna, MD  Discussed the following assessment and plan:  No diagnosis found.     I discussed the assessment and treatment plan with the patient. The patient was provided an opportunity to ask questions and all were answered. The patient agreed with the plan and demonstrated an understanding of the instructions.   The patient was advised to call back or seek an in-person evaluation if the symptoms worsen or if the condition fails to improve as anticipated.

## 2018-09-24 NOTE — Telephone Encounter (Signed)
He was seen in a virtual OV today.

## 2018-09-25 LAB — NOVEL CORONAVIRUS, NAA: SARS-CoV-2, NAA: DETECTED — AB

## 2018-09-30 ENCOUNTER — Other Ambulatory Visit: Payer: Self-pay

## 2018-09-30 ENCOUNTER — Encounter: Payer: Self-pay | Admitting: Family Medicine

## 2018-09-30 ENCOUNTER — Telehealth (INDEPENDENT_AMBULATORY_CARE_PROVIDER_SITE_OTHER): Payer: 59 | Admitting: Family Medicine

## 2018-09-30 DIAGNOSIS — J208 Acute bronchitis due to other specified organisms: Secondary | ICD-10-CM

## 2018-09-30 DIAGNOSIS — U071 COVID-19: Secondary | ICD-10-CM | POA: Insufficient documentation

## 2018-09-30 MED ORDER — AMOXICILLIN-POT CLAVULANATE 875-125 MG PO TABS: 1.0000 | ORAL_TABLET | Freq: Two times a day (BID) | ORAL | 0 refills | Status: DC

## 2018-09-30 MED ORDER — METHYLPREDNISOLONE 4 MG PO TBPK: ORAL_TABLET | ORAL | 0 refills | Status: DC

## 2018-09-30 NOTE — Progress Notes (Signed)
Virtual Visit via Video Note  I connected with the patient on 09/30/18 at  2:15 PM EDT by a video enabled telemedicine application and verified that I am speaking with the correct person using two identifiers.  Location patient: home Location provider:work or home office Persons participating in the virtual visit: patient, provider  I discussed the limitations of evaluation and management by telemedicine and the availability of in person appointments. The patient expressed understanding and agreed to proceed.   HPI: Here with continuing URI symptoms. Last week he took a Zpack and he felt better for a few days, but then the symptoms returned. He has had fevers, SOB, and coughing up yellow sputum for the past 10 days. He and his wife both tested positive for the Covid-19 virus last week.    ROS: See pertinent positives and negatives per HPI.  Past Medical History:  Diagnosis Date  . Allergy   . Cancer ([place])    melanoma  . Ganglion of right wrist   . GERD (gastroesophageal reflux disease)   . Hernia, inguinal   . Hyperlipidemia   . 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 COLONOSCOPY  04/11/2016   per Dr. Havery Moros, adenomatous polyps, repeat in 5 yrs   . ESOPHAGOGASTRODUODENOSCOPY  04/25/06  . HERNIA REPAIR    . left knee scope    . TONSILLECTOMY AND ADENOIDECTOMY    . UMBILICAL HERNIA REPAIR N/A 03/25/2012   Procedure: HERNIA REPAIR-- UMBILICAL-- ADULT;  Surgeon: Pedro Earls, MD;  Location: [place];  Service: General;  Laterality: N/A;  . UMBILICAL HERNIA REPAIR N/A 05/16/2016   Procedure: LAPAROSCOPIC ASSISTED REPAIR OF RECURRENT UMBILICAL HERNIA WITH MESH;  Surgeon: Johnathan Hausen, MD;  Location: [facility];  Service: General;  Laterality: N/A;    Family History  Problem Relation Age of Onset  . Colon cancer Neg Hx      Current Outpatient Medications:  .  acyclovir (ZOVIRAX) 200 MG capsule, Take 1  capsule (200 mg total) by mouth 5 (five) times daily., Disp: 60 capsule, Rfl: 3 .  albuterol (PROVENTIL HFA) 108 (90 Base) MCG/ACT inhaler, Inhale 2 puffs into the lungs every 4 (four) hours as needed for wheezing or shortness of breath., Disp: 18 g, Rfl: 5 .  amoxicillin-clavulanate (AUGMENTIN) 875-125 MG tablet, Take 1 tablet by mouth 2 (two) times daily., Disp: 20 tablet, Rfl: 0 .  aspirin 81 MG tablet, Take 81 mg by mouth daily.  , Disp: , Rfl:  .  citalopram (CELEXA) 20 MG tablet, Take 1 tablet (20 mg total) by mouth daily., Disp: 90 tablet, Rfl: 3 .  diclofenac (VOLTAREN) 75 MG EC tablet, Take 1 tablet (75 mg total) by mouth 2 (two) times daily., Disp: 60 tablet, Rfl: 2 .  fluticasone (FLONASE) 50 MCG/ACT nasal spray, USE TWO SPRAY(S) IN EACH NOSTRIL ONCE DAILY, Disp: 16 g, Rfl: 11 .  loratadine (CLARITIN) 10 MG tablet, Take 10 mg by mouth daily., Disp: , Rfl:  .  methylPREDNISolone (MEDROL DOSEPAK) 4 MG TBPK tablet, As directed, Disp: 21 tablet, Rfl: 0 .  omeprazole (PRILOSEC) 20 MG capsule, Take 1 capsule (20 mg total) by mouth daily., Disp: 90 capsule, Rfl: 3 .  sildenafil (REVATIO) 20 MG tablet, Take 1 tablet (20 mg total) by mouth 3 (three) times daily., Disp: 20 tablet, Rfl: 10 .  simvastatin (ZOCOR) 40 MG tablet, TAKE 1 TABLET BY MOUTH AT BEDTIME, Disp: 31 tablet,  Rfl: 11  EXAM:  VITALS per patient if applicable:  GENERAL: alert, oriented, appears well and in no acute distress  HEENT: atraumatic, conjunttiva clear, no obvious abnormalities on inspection of external nose and ears  NECK: normal movements of the head and neck  LUNGS: on inspection no signs of respiratory distress, breathing rate appears normal, no obvious gross SOB, gasping or wheezing  CV: no obvious cyanosis  MS: moves all visible extremities without noticeable abnormality  PSYCH/NEURO: pleasant and cooperative, no obvious depression or anxiety, speech and thought processing grossly intact  ASSESSMENT AND  PLAN: He likely has a bronchitis from the viral infection. Treat with Augmentin and a Medrol dose pack. Use the albuterol inhaler prn.  [name], MD  Discussed the following assessment and plan:  No diagnosis found.     I discussed the assessment and treatment plan with the patient. The patient was provided an opportunity to ask questions and all were answered. The patient agreed with the plan and demonstrated an understanding of the instructions.   The patient was advised to call back or seek an in-person evaluation if the symptoms worsen or if the condition fails to improve as anticipated.

## 2018-09-30 NOTE — Telephone Encounter (Signed)
Spoke with patient via phone. He has been scheduled this afternoon. Nothing further needed

## 2018-10-12 ENCOUNTER — Other Ambulatory Visit: Payer: Self-pay

## 2018-10-12 DIAGNOSIS — Z20822 Contact with and (suspected) exposure to covid-19: Secondary | ICD-10-CM

## 2018-10-14 ENCOUNTER — Encounter: Payer: 59 | Admitting: Family Medicine

## 2018-10-14 LAB — NOVEL CORONAVIRUS, NAA: SARS-CoV-2, NAA: NOT DETECTED

## 2018-10-22 ENCOUNTER — Other Ambulatory Visit: Payer: Self-pay | Admitting: Family Medicine

## 2018-10-25 ENCOUNTER — Emergency Department (HOSPITAL_COMMUNITY): Payer: 59

## 2018-10-25 ENCOUNTER — Emergency Department (HOSPITAL_COMMUNITY)
Admission: EM | Admit: 2018-10-25 | Discharge: 2018-10-25 | Disposition: A | Discharge status: Home / Self Care | Payer: 59 | Attending: Emergency Medicine | Admitting: Emergency Medicine

## 2018-10-25 ENCOUNTER — Encounter (HOSPITAL_COMMUNITY): Payer: Self-pay | Admitting: Emergency Medicine

## 2018-10-25 DIAGNOSIS — F17210 Nicotine dependence, cigarettes, uncomplicated: Secondary | ICD-10-CM | POA: Diagnosis not present

## 2018-10-25 DIAGNOSIS — Z85828 Personal history of other malignant neoplasm of skin: Secondary | ICD-10-CM | POA: Insufficient documentation

## 2018-10-25 DIAGNOSIS — R0602 Shortness of breath: Secondary | ICD-10-CM | POA: Diagnosis present

## 2018-10-25 DIAGNOSIS — R519 Headache, unspecified: Secondary | ICD-10-CM | POA: Insufficient documentation

## 2018-10-25 DIAGNOSIS — M7918 Myalgia, other site: Secondary | ICD-10-CM | POA: Insufficient documentation

## 2018-10-25 DIAGNOSIS — B349 Viral infection, unspecified: Secondary | ICD-10-CM

## 2018-10-25 DIAGNOSIS — R05 Cough: Secondary | ICD-10-CM | POA: Insufficient documentation

## 2018-10-25 DIAGNOSIS — U071 COVID-19: Secondary | ICD-10-CM | POA: Diagnosis not present

## 2018-10-25 LAB — BASIC METABOLIC PANEL WITH GFR
Anion gap: 6 (ref 5–15)
BUN: 8 mg/dL (ref 6–20)
CO2: 27 mmol/L (ref 22–32)
Calcium: 9.2 mg/dL (ref 8.9–10.3)
Chloride: 104 mmol/L (ref 98–111)
Creatinine, Ser: 0.73 mg/dL (ref 0.61–1.24)
GFR calc Af Amer: >60 mL/min
GFR calc non Af Amer: >60 mL/min
Glucose, Bld: 101 mg/dL — ABNORMAL HIGH (ref 70–99)
Potassium: 4.1 mmol/L (ref 3.5–5.1)
Sodium: 137 mmol/L (ref 135–145)

## 2018-10-25 LAB — CBC
HCT: 41 % (ref 39.0–52.0)
Hemoglobin: 13.6 g/dL (ref 13.0–17.0)
MCH: 30 pg (ref 26.0–34.0)
MCHC: 33.2 g/dL (ref 30.0–36.0)
MCV: 90.5 fL (ref 80.0–100.0)
Platelets: 260 10*3/uL (ref 150–400)
RBC: 4.53 MIL/uL (ref 4.22–5.81)
RDW: 12.3 % (ref 11.5–15.5)
WBC: 4.6 10*3/uL (ref 4.0–10.5)
nRBC: 0 % (ref 0.0–0.2)

## 2018-10-25 MED ORDER — ACETAMINOPHEN 325 MG PO TABS
650.0000 mg | ORAL_TABLET | Freq: Once | ORAL | Status: AC
  Administered 2018-10-25: 13:00:00 650 mg via ORAL
  Filled 2018-10-25: qty 2

## 2018-10-25 MED ORDER — ALBUTEROL SULFATE (2.5 MG/3ML) 0.083% IN NEBU: 5.0000 mg | INHALATION_SOLUTION | Freq: Once | RESPIRATORY_TRACT | Status: DC

## 2018-10-25 NOTE — ED Notes (Signed)
Patient verbalizes understanding of discharge instructions. Opportunity for questioning and answers were provided. Armband removed by staff, pt discharged from ED.  

## 2018-10-25 NOTE — Discharge Instructions (Signed)
Your labwork and chest xray were reassuring today You are likely still experiencing symptoms from covid 19. We have not retested you today. It is advised that you stay home and self isolate for at least 10 days from symptom onset. I have given you a work note that states you may return to work on 10/18.  Continue monitoring your symptoms at home and taking OTC medications for symptom relief.      Person Under Monitoring Name: Connor Thomas  Location: [place] [address]   Infection Prevention Recommendations for Individuals Confirmed to have, or Being Evaluated for, 2019 Novel Coronavirus (COVID-19) Infection Who Receive Care at Home  Individuals who are confirmed to have, or are being evaluated for, COVID-19 should follow the prevention steps below until a healthcare provider or local or state health department says they can return to normal activities.  Stay home except to get medical care You should restrict activities outside your home, except for getting medical care. Do not go to work, school, or public areas, and do not use public transportation or taxis.  Call ahead before visiting your doctor Before your medical appointment, call the healthcare provider and tell them that you have, or are being evaluated for, COVID-19 infection. This will help the healthcare providers office take steps to keep other people from getting infected. Ask your healthcare provider to call the local or state health department.  Monitor your symptoms Seek prompt medical attention if your illness is worsening (e.g., difficulty breathing). Before going to your medical appointment, call the healthcare provider and tell them that you have, or are being evaluated for, COVID-19 infection. Ask your healthcare provider to call the local or state health department.  Wear a facemask You should wear a facemask that covers your nose and mouth when you are in the same room with other people  and when you visit a healthcare provider. People who live with or visit you should also wear a facemask while they are in the same room with you.  Separate yourself from other people in your home As much as possible, you should stay in a different room from other people in your home. Also, you should use a separate bathroom, if available.  Avoid sharing household items You should not share dishes, drinking glasses, cups, eating utensils, towels, bedding, or other items with other people in your home. After using these items, you should wash them thoroughly with soap and water.  Cover your coughs and sneezes Cover your mouth and nose with a tissue when you cough or sneeze, or you can cough or sneeze into your sleeve. Throw used tissues in a lined trash can, and immediately wash your hands with soap and water for at least 20 seconds or use an alcohol-based hand rub.  Wash your [facility] your hands often and thoroughly with soap and water for at least 20 seconds. You can use an alcohol-based hand sanitizer if soap and water are not available and if your hands are not visibly dirty. Avoid touching your eyes, nose, and mouth with unwashed hands.   Prevention Steps for Caregivers and Household Members of Individuals Confirmed to have, or Being Evaluated for, COVID-19 Infection Being Cared for in the Home  If you live with, or provide care at home for, a person confirmed to have, or being evaluated for, COVID-19 infection please follow these guidelines to prevent infection:  Follow healthcare providers instructions Make sure that you understand and can help the  patient follow any healthcare provider instructions for all care.  Provide for the patients basic needs You should help the patient with basic needs in the home and provide support for getting groceries, prescriptions, and other personal needs.  Monitor the patients symptoms If they are getting sicker, call his or her medical  provider and tell them that the patient has, or is being evaluated for, COVID-19 infection. This will help the healthcare providers office take steps to keep other people from getting infected. Ask the healthcare provider to call the local or state health department.  Limit the number of people who have contact with the patient If possible, have only one caregiver for the patient. Other household members should stay in another home or place of residence. If this is not possible, they should stay in another room, or be separated from the patient as much as possible. Use a separate bathroom, if available. Restrict visitors who do not have an essential need to be in the home.  Keep older adults, very young children, and other sick people away from the patient Keep older adults, very young children, and those who have compromised immune systems or chronic health conditions away from the patient. This includes people with chronic heart, lung, or kidney conditions, diabetes, and cancer.  Ensure good ventilation Make sure that shared spaces in the home have good air flow, such as from an air conditioner or an opened window, weather permitting.  Wash your hands often Wash your hands often and thoroughly with soap and water for at least 20 seconds. You can use an alcohol based hand sanitizer if soap and water are not available and if your hands are not visibly dirty. Avoid touching your eyes, nose, and mouth with unwashed hands. Use disposable paper towels to dry your hands. If not available, use dedicated cloth towels and replace them when they become wet.  Wear a facemask and gloves Wear a disposable facemask at all times in the room and gloves when you touch or have contact with the patients blood, body fluids, and/or secretions or excretions, such as sweat, saliva, sputum, nasal mucus, vomit, urine, or feces.  Ensure the mask fits over your nose and mouth tightly, and do not touch it during  use. Throw out disposable facemasks and gloves after using them. Do not reuse. Wash your hands immediately after removing your facemask and gloves. If your personal clothing becomes contaminated, carefully remove clothing and launder. Wash your hands after handling contaminated clothing. Place all used disposable facemasks, gloves, and other waste in a lined container before disposing them with other household waste. Remove gloves and wash your hands immediately after handling these items.  Do not share dishes, glasses, or other household items with the patient Avoid sharing household items. You should not share dishes, drinking glasses, cups, eating utensils, towels, bedding, or other items with a patient who is confirmed to have, or being evaluated for, COVID-19 infection. After the person uses these items, you should wash them thoroughly with soap and water.  Wash laundry thoroughly Immediately remove and wash clothes or bedding that have blood, body fluids, and/or secretions or excretions, such as sweat, saliva, sputum, nasal mucus, vomit, urine, or feces, on them. Wear gloves when handling laundry from the patient. Read and follow directions on labels of laundry or clothing items and detergent. In general, wash and dry with the warmest temperatures recommended on the label.  Clean all areas the individual has used often Clean all touchable surfaces, such  as counters, tabletops, doorknobs, bathroom fixtures, toilets, phones, keyboards, tablets, and bedside tables, every day. Also, clean any surfaces that may have blood, body fluids, and/or secretions or excretions on them. Wear gloves when cleaning surfaces the patient has come in contact with. Use a diluted bleach solution (e.g., dilute bleach with 1 part bleach and 10 parts water) or a household disinfectant with a label that says EPA-registered for coronaviruses. To make a bleach solution at home, add 1 tablespoon of bleach to 1 quart (4  cups) of water. For a larger supply, add  cup of bleach to 1 gallon (16 cups) of water. Read labels of cleaning products and follow recommendations provided on product labels. Labels contain instructions for safe and effective use of the cleaning product including precautions you should take when applying the product, such as wearing gloves or eye protection and making sure you have good ventilation during use of the product. Remove gloves and wash hands immediately after cleaning.  Monitor yourself for signs and symptoms of illness Caregivers and household members are considered close contacts, should monitor their health, and will be asked to limit movement outside of the home to the extent possible. Follow the monitoring steps for close contacts listed on the symptom monitoring form.   ? If you have additional questions, contact your local health department or call the epidemiologist on call at [phone] (available 24/7). ? This guidance is subject to change. For the most up-to-date guidance from [facility], please refer to their website: [email or web address]

## 2018-10-25 NOTE — ED Provider Notes (Signed)
[facility] EMERGENCY DEPARTMENT Provider Note   CSN: [id number] Arrival date & time: 10/25/18  1023     History   Chief Complaint Chief Complaint  Patient presents with  . Shortness of Breath  . Cough  . Generalized Body Aches    HPI Connor Thomas is a 58 y.o. male who presents to the ED today complaining of returning cough, SOB, myalgias, and fever x 1-2 days.  Patient reports he was tested positive for COVID-19 on 9/03.  His wife tested positive.  He states that he quarantine at home and was tested again on 9/21 which was negative.  He states that he felt improved last week and went to work but 1 to 2 days ago started having symptoms again.  States he has been taking over-the-counter medications including DayQuil, Tylenol cold and flu, ibuprofen for fever with relief.  Reports he was concerned as he began having symptoms again but his wife seems much improved after her symptoms dissipated. Denies chills, sore throat, ear pain, chest pain, nausea, vomiting, diarrhea, vision changes, neck stiffness, rash, or any other associated symptoms.        Past Medical History:  Diagnosis Date  . Allergy   . Cancer ([place])    melanoma  . Ganglion of right wrist   . GERD (gastroesophageal reflux disease)   . Hernia, inguinal   . Hyperlipidemia   . 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 COVID-19 virus infection 09/30/2018  . S/P repair of recurrent ventral hernia April 2018 05/16/2016  . Elevated blood sugar 10/17/2014  . Hematuria of undiagnosed cause 10/17/2014  . Hyperlipidemia 07/27/2012  . Depression (em[place]) 07/27/2012  . Allergic rhinitis 09/12/2006  . GERD 09/12/2006    Past Surgical History:  Procedure Laterality Date  . COLONOSCOPY  04/11/2016   per Dr. Havery Moros, adenomatous polyps, repeat in 5 yrs   . ESOPHAGOGASTRODUODENOSCOPY  04/25/06  . HERNIA REPAIR    . left knee scope    .  TONSILLECTOMY AND ADENOIDECTOMY    . UMBILICAL HERNIA REPAIR N/A 03/25/2012   Procedure: HERNIA REPAIR-- UMBILICAL-- ADULT;  Surgeon: Pedro Earls, MD;  Location: [facility];  Service: General;  Laterality: N/A;  . UMBILICAL HERNIA REPAIR N/A 05/16/2016   Procedure: LAPAROSCOPIC ASSISTED REPAIR OF RECURRENT UMBILICAL HERNIA WITH MESH;  Surgeon: Johnathan Hausen, MD;  Location: [facility];  Service: General;  Laterality: N/A;        Home Medications    Prior to Admission medications   Medication Sig Start Date End Date Taking? Authorizing Provider  acyclovir (ZOVIRAX) 200 MG capsule Take 1 capsule (200 mg total) by mouth 5 (five) times daily. 10/03/17   Laurey Morale, MD  albuterol (PROVENTIL HFA) 108 (90 Base) MCG/ACT inhaler Inhale 2 puffs into the lungs every 4 (four) hours as needed for wheezing or shortness of breath. 10/03/17   Laurey Morale, MD  amoxicillin-clavulanate (AUGMENTIN) 875-125 MG tablet Take 1 tablet by mouth 2 (two) times daily. 09/30/18   Laurey Morale, MD  aspirin 81 MG tablet Take 81 mg by mouth daily.      [provider]  citalopram (CELEXA) 20 MG tablet Take 1 tablet by mouth once daily 10/23/18   Laurey Morale, MD  diclofenac (VOLTAREN) 75 MG EC tablet Take 1 tablet (75 mg total) by mouth 2 (two) times daily. 06/02/18   [name]  A, MD  fluticasone (FLONASE) 50 MCG/ACT nasal spray USE TWO SPRAY(S) IN EACH NOSTRIL ONCE DAILY 05/04/18   Laurey Morale, MD  loratadine (CLARITIN) 10 MG tablet Take 10 mg by mouth daily.    [provider]  methylPREDNISolone (MEDROL DOSEPAK) 4 MG TBPK tablet As directed 09/30/18   Laurey Morale, MD  omeprazole (PRILOSEC) 20 MG capsule Take 1 capsule (20 mg total) by mouth daily. 10/03/17   Laurey Morale, MD  sildenafil (REVATIO) 20 MG tablet Take 1 tablet (20 mg total) by mouth 3 (three) times daily. 10/03/17   Laurey Morale, MD  simvastatin (ZOCOR) 40 MG tablet TAKE 1 TABLET BY MOUTH AT BEDTIME 12/02/17   Laurey Morale, MD    Family History Family History  Problem Relation Age of Onset  . Colon cancer Neg Hx     Social History Social History   Tobacco Use  . Smoking status: Light Tobacco Smoker    Packs/day: 0.10    Types: Cigarettes    Last attempt to quit: 04/30/2005    Years since quitting: 13.4  . Smokeless tobacco: Never Used  Substance Use Topics  . Alcohol use: Yes    Alcohol/week: 12.0 standard drinks    Types: 12 Cans of beer per week  . Drug use: No     Allergies   Patient has no known allergies.   Review of Systems Review of Systems  Constitutional: Positive for fatigue and fever. Negative for chills.  HENT: Negative for congestion.   Eyes: Negative for visual disturbance.  Respiratory: Positive for cough and shortness of breath.   Cardiovascular: Negative for chest pain.  Gastrointestinal: Negative for abdominal pain, diarrhea, nausea and vomiting.  Genitourinary: Negative for difficulty urinating.  Musculoskeletal: Positive for myalgias. Negative for neck pain and neck stiffness.  Skin: Negative for rash.  Neurological: Positive for headaches.  Psychiatric/Behavioral: Negative for confusion.     Physical Exam Updated Vital Signs BP (!) 144/70 (BP Location: Right Arm)   Pulse 90   Temp 100.2 F (37.9 C) (Oral)   Resp 17   SpO2 96%   Physical Exam Vitals signs and nursing note reviewed.  Constitutional:      Appearance: He is obese. He is not ill-appearing.  HENT:     Head: Normocephalic and atraumatic.  Eyes:     Extraocular Movements: Extraocular movements intact.     Conjunctiva/sclera: Conjunctivae normal.     Pupils: Pupils are equal, round, and reactive to light.  Neck:     Musculoskeletal: Normal range of motion and neck supple.     Meningeal: Brudzinski's sign and Kernig's sign absent.  Cardiovascular:     Rate and Rhythm: Normal rate and regular rhythm.     Pulses: Normal pulses.  Pulmonary:     Effort: Pulmonary effort is normal.      Breath sounds: Normal breath sounds. No decreased breath sounds, wheezing, rhonchi or rales.  Chest:     Chest wall: No tenderness.  Abdominal:     Palpations: Abdomen is soft.     Tenderness: There is no abdominal tenderness. There is no guarding or rebound.  Musculoskeletal:     Right lower leg: No edema.     Left lower leg: No edema.  Skin:    General: Skin is warm and dry.  Neurological:     Mental Status: He is alert.     Comments: CN 2-12 grossly intact A&O x4 GCS 15 Sensation and strength intact  Gait nonataxic including with tandem walking Coordination with finger-to-nose WNL Neg romberg, neg pronator drift      ED Treatments / Results  Labs (all labs ordered are listed, but only abnormal results are displayed) Labs Reviewed  BASIC METABOLIC PANEL - Abnormal; Notable for the following components:      Result Value   Glucose, Bld 101 (*)    All other components within normal limits  CBC    EKG None  Radiology Dg Chest Port 1 View  Result Date: 10/25/2018 CLINICAL DATA:  Cough, fever, shortness of breath and chest pain. EXAM: PORTABLE CHEST 1 VIEW COMPARISON:  None. FINDINGS: Normal sized heart. Clear lungs. Mild-to-moderate central peribronchial thickening. Unremarkable bones. IMPRESSION: Mild to moderate bronchitic changes. Electronically Signed   By: Claudie Revering M.D.   On: 10/25/2018 13:32    Procedures Procedures (including critical care time)  Medications Ordered in ED Medications  albuterol (PROVENTIL) (2.5 MG/3ML) 0.083% nebulizer solution 5 mg (has no administration in time range)  acetaminophen (TYLENOL) tablet 650 mg (650 mg Oral Given 10/25/18 1240)     Initial Impression / Assessment and Plan / ED Course  I have reviewed the triage vital signs and the nursing notes.  Pertinent labs & imaging results that were available during my care of the patient were reviewed by me and considered in my medical decision making (see chart for details).     58 year old male who presents the ED complaining of continued shortness of breath, cough, fever, body aches, headache.  Recently was tested positive for COVID-19 on 9/03- on 9/21.  Patient reports he felt improved after self quarantine and testing negative and went back to work last week.  He states that he began having symptoms again 1 to 2 days ago.  Arrival to the ED patient's temperature 100.2.  No tachycardia, tachypnea or hypotension present.  Patient satting 96% on room air.  He is able to speak in full sentences without accessory muscle use.  His lungs are clear to auscultation bilaterally.  Given Tylenol for fever.  Baseline blood work was obtained prior to being seen -no leukocytosis today.  No electrolyte abnormalities.  Does not appear patient has had a chest x-ray since being diagnosed with COVID-19.  Will obtain this today with shortness of breath.  Discussed this case with attending physician Dr. Vanita Panda who states that this is likely a continuation of his COVID-19.  He does not recommend to repeat test today.  Patient will need to go home and self isolate until he is symptom-free for greater than 1 week.   Discussed case with infection prevention Adonis Brook has been made aware of patient's traveling history last week.  They recommended follow-up with infectious disease specialist Dr. Megan Salon who is on-call today to discuss need for retesting at this time.  I have paged him.   Discussed case with Dr. Megan Salon with ID. He does not think a retest is necessary at this time. Recommends pt self isolate for 10 days from symptom onset. Xray does show some bronchitis changes but no signs of PNA today. Pt has albuterol inhaler at home and does report he has already been on a zpack and Augmentin from his PCP. Do not feel he needs additional abx at this time. I have discussed self isolation with pt - he reports his work will require 14 days. I will give him a work note for this.   Connor Thomas  was evaluated in Emergency Department on 10/25/2018 for the  symptoms described in the history of present illness. He was evaluated in the context of the global COVID-19 pandemic, which necessitated consideration that the patient might be at risk for infection with the SARS-CoV-2 virus that causes COVID-19. Institutional protocols and algorithms that pertain to the evaluation of patients at risk for COVID-19 are in a state of rapid change based on information released by regulatory bodies including the CDC and federal and state organizations. These policies and algorithms were followed during the patient's care in the ED.  This note was prepared using Dragon voice recognition software and may include unintentional dictation errors due to the inherent limitations of voice recognition software.       Final Clinical Impressions(s) / ED Diagnoses   Final diagnoses:  Viral illness  COVID-19    ED Discharge Orders    None       Eustaquio Maize, PA-C 10/25/18 1457    Carmin Muskrat, MD 10/26/18 [phone]

## 2018-10-25 NOTE — ED Triage Notes (Signed)
Pt. Stated, I had COVID 5 weeks ago Sept. 3,I tested POSITIVE  and on Sept 21 I tested neg. I feel like Im still weak with SOB , cough and having body aches off and on.

## 2018-10-27 ENCOUNTER — Other Ambulatory Visit: Payer: Self-pay

## 2018-10-27 DIAGNOSIS — Z20822 Contact with and (suspected) exposure to covid-19: Secondary | ICD-10-CM

## 2018-10-29 ENCOUNTER — Other Ambulatory Visit: Payer: Self-pay | Admitting: Family Medicine

## 2018-10-29 LAB — NOVEL CORONAVIRUS, NAA: SARS-CoV-2, NAA: NOT DETECTED

## 2018-11-02 ENCOUNTER — Encounter: Payer: 59 | Admitting: Family Medicine

## 2018-11-16 ENCOUNTER — Other Ambulatory Visit: Payer: Self-pay | Admitting: Family Medicine

## 2018-11-23 ENCOUNTER — Encounter: Payer: Self-pay | Admitting: Family Medicine

## 2018-11-23 ENCOUNTER — Ambulatory Visit (INDEPENDENT_AMBULATORY_CARE_PROVIDER_SITE_OTHER): Payer: 59 | Admitting: Family Medicine

## 2018-11-23 ENCOUNTER — Other Ambulatory Visit: Payer: Self-pay

## 2018-11-23 VITALS — BP 128/74 | HR 80 | Temp 98.3°F | Ht 67.5 in | Wt 244.8 lb

## 2018-11-23 DIAGNOSIS — Z0000 Encounter for general adult medical examination without abnormal findings: Secondary | ICD-10-CM | POA: Diagnosis not present

## 2018-11-23 DIAGNOSIS — U071 COVID-19: Secondary | ICD-10-CM

## 2018-11-23 DIAGNOSIS — Z125 Encounter for screening for malignant neoplasm of prostate: Secondary | ICD-10-CM

## 2018-11-23 DIAGNOSIS — Z8619 Personal history of other infectious and parasitic diseases: Secondary | ICD-10-CM

## 2018-11-23 DIAGNOSIS — R739 Hyperglycemia, unspecified: Secondary | ICD-10-CM | POA: Diagnosis not present

## 2018-11-23 DIAGNOSIS — Z23 Encounter for immunization: Secondary | ICD-10-CM

## 2018-11-23 LAB — CBC WITH DIFFERENTIAL/PLATELET
Basophils Absolute: 0.1 10*3/uL (ref 0.0–0.1)
Basophils Relative: 0.9 % (ref 0.0–3.0)
Eosinophils Absolute: 0.3 10*3/uL (ref 0.0–0.7)
Eosinophils Relative: 4.1 % (ref 0.0–5.0)
HCT: 40.8 % (ref 39.0–52.0)
Hemoglobin: 13.8 g/dL (ref 13.0–17.0)
Lymphocytes Relative: 39.9 % (ref 12.0–46.0)
Lymphs Abs: 2.9 10*3/uL (ref 0.7–4.0)
MCHC: 33.7 g/dL (ref 30.0–36.0)
MCV: 89.5 fl (ref 78.0–100.0)
Monocytes Absolute: 0.5 10*3/uL (ref 0.1–1.0)
Monocytes Relative: 6.9 % (ref 3.0–12.0)
Neutro Abs: 3.6 10*3/uL (ref 1.4–7.7)
Neutrophils Relative %: 48.2 % (ref 43.0–77.0)
Platelets: 285 10*3/uL (ref 150.0–400.0)
RBC: 4.56 Mil/uL (ref 4.22–5.81)
RDW: 13.5 % (ref 11.5–15.5)
WBC: 7.4 10*3/uL (ref 4.0–10.5)

## 2018-11-23 LAB — LIPID PANEL
Cholesterol: 191 mg/dL (ref 0–200)
HDL: 41.6 mg/dL (ref >39.00)
LDL Cholesterol: 121 mg/dL — ABNORMAL HIGH (ref 0–99)
NonHDL: 149.35
Total CHOL/HDL Ratio: 5
Triglycerides: 144 mg/dL (ref 0.0–149.0)
VLDL: 28.8 mg/dL (ref 0.0–40.0)

## 2018-11-23 LAB — POC URINALSYSI DIPSTICK (AUTOMATED)
Glucose, UA: NEGATIVE
Leukocytes, UA: NEGATIVE
Protein, UA: NEGATIVE
Spec Grav, UA: >=1.03 — AB (ref 1.010–1.025)
Urobilinogen, UA: 0.2 E.U./dL
pH, UA: 5 (ref 5.0–8.0)

## 2018-11-23 LAB — BASIC METABOLIC PANEL
BUN: 15 mg/dL (ref 6–23)
CO2: 28 mEq/L (ref 19–32)
Calcium: 9.5 mg/dL (ref 8.4–10.5)
Chloride: 102 mEq/L (ref 96–112)
Creatinine, Ser: 0.77 mg/dL (ref 0.40–1.50)
GFR: 103.58 mL/min (ref >60.00)
Glucose, Bld: 109 mg/dL — ABNORMAL HIGH (ref 70–99)
Potassium: 4.3 mEq/L (ref 3.5–5.1)
Sodium: 138 mEq/L (ref 135–145)

## 2018-11-23 LAB — HEPATIC FUNCTION PANEL
ALT: 30 U/L (ref 0–53)
AST: 21 U/L (ref 0–37)
Albumin: 4.6 g/dL (ref 3.5–5.2)
Alkaline Phosphatase: 79 U/L (ref 39–117)
Bilirubin, Direct: 0.1 mg/dL (ref 0.0–0.3)
Total Bilirubin: 0.5 mg/dL (ref 0.2–1.2)
Total Protein: 7.1 g/dL (ref 6.0–8.3)

## 2018-11-23 LAB — SARS-COV-2 IGG: SARS-COV-2 IgG: 41.45

## 2018-11-23 LAB — HEMOGLOBIN A1C: Hgb A1c MFr Bld: 5.8 % (ref 4.6–6.5)

## 2018-11-23 LAB — TSH: TSH: 1.45 u[IU]/mL (ref 0.35–4.50)

## 2018-11-23 LAB — PSA: PSA: 0.49 ng/mL (ref 0.10–4.00)

## 2018-11-23 MED ORDER — SIMVASTATIN 40 MG PO TABS: 40.0000 mg | ORAL_TABLET | Freq: Every day | ORAL | 11 refills | Status: DC

## 2018-11-23 MED ORDER — CITALOPRAM HYDROBROMIDE 20 MG PO TABS: 20.0000 mg | ORAL_TABLET | Freq: Every day | ORAL | 11 refills | Status: DC

## 2018-11-23 MED ORDER — OMEPRAZOLE 20 MG PO CPDR: 20.0000 mg | DELAYED_RELEASE_CAPSULE | Freq: Every day | ORAL | 11 refills | Status: DC

## 2018-11-23 NOTE — Progress Notes (Signed)
Subjective:    Patient ID: Connor Thomas, male    DOB: 01-28-1960, 58 y.o.   MRN: [id number]  HPI Here for a well exam. He feels well today. He tested positive for the Covid-19 virus on 09-24-18 and he had typical symptoms of fever, body aches, cough, and SOB. He had lingering symptoms for almost 2 months, but he subsequently tested negative on 10-12-18 and on 10-27-18. Finally he was able to fully recover.    Review of Systems  Constitutional: Negative.   HENT: Negative.   Eyes: Negative.   Respiratory: Negative.   Cardiovascular: Negative.   Gastrointestinal: Negative.   Genitourinary: Negative.   Musculoskeletal: Negative.   Skin: Negative.   Neurological: Negative.   Psychiatric/Behavioral: Negative.        Objective:   Physical Exam Constitutional:      General: He is not in acute distress.    Appearance: He is well-developed. He is not diaphoretic.  HENT:     Head: Normocephalic and atraumatic.     Right Ear: External ear normal.     Left Ear: External ear normal.     Nose: Nose normal.     Mouth/Throat:     Pharynx: No oropharyngeal exudate.  Eyes:     General: No scleral icterus.       Right eye: No discharge.        Left eye: No discharge.     Conjunctiva/sclera: Conjunctivae normal.     Pupils: Pupils are equal, round, and reactive to light.  Neck:     Musculoskeletal: Neck supple.     Thyroid: No thyromegaly.     Vascular: No JVD.     Trachea: No tracheal deviation.  Cardiovascular:     Rate and Rhythm: Normal rate and regular rhythm.     Heart sounds: Normal heart sounds. No murmur. No friction rub. No gallop.   Pulmonary:     Effort: Pulmonary effort is normal. No respiratory distress.     Breath sounds: Normal breath sounds. No wheezing or rales.  Chest:     Chest wall: No tenderness.  Abdominal:     General: Bowel sounds are normal. There is no distension.     Palpations: Abdomen is soft. There is no mass.     Tenderness: There is no abdominal  tenderness. There is no guarding or rebound.  Genitourinary:    Penis: Normal. No tenderness.      Scrotum/Testes: Normal.     Prostate: Normal.     Rectum: Normal. Guaiac result negative.  Musculoskeletal: Normal range of motion.        General: No tenderness.  Lymphadenopathy:     Cervical: No cervical adenopathy.  Skin:    General: Skin is warm and dry.     Coloration: Skin is not pale.     Findings: No erythema or rash.  Neurological:     Mental Status: He is alert and oriented to person, place, and time.     Cranial Nerves: No cranial nerve deficit.     Motor: No abnormal muscle tone.     Coordination: Coordination normal.     Deep Tendon Reflexes: Reflexes are normal and symmetric. Reflexes normal.  Psychiatric:        Behavior: Behavior normal.        Thought Content: Thought content normal.        Judgment: Judgment normal.           Assessment & Plan:  Well exam.  We discussed diet and exercise. Get fasting labs. He has recovered from a Covvid-19 infection, so we will check for IgG antibodies.  [name], MD

## 2018-11-23 NOTE — Patient Instructions (Signed)
Health Maintenance Due  Topic Date Due  . Hepatitis C Screening  1960-03-03  . HIV Screening  05/16/1975  . INFLUENZA VACCINE  08/22/2018    Depression screen PHQ 2/9 10/03/2017  Decreased Interest 0  Down, Depressed, Hopeless 0  PHQ - 2 Score 0

## 2019-04-08 ENCOUNTER — Ambulatory Visit: Payer: 59

## 2019-04-08 ENCOUNTER — Ambulatory Visit: Payer: 59 | Attending: Family

## 2019-04-08 DIAGNOSIS — Z23 Encounter for immunization: Secondary | ICD-10-CM

## 2019-04-08 NOTE — Progress Notes (Signed)
   Covid-19 Vaccination Clinic  Name:  ANANTH MAISONET    MRN: [id number] DOB: 1960/04/29  04/08/2019  Mr. Chretien was observed post Covid-19 immunization for 15 minutes without incident. He was provided with Vaccine Information Sheet and instruction to access the V-Safe system.   Mr. Golon was instructed to call 911 with any severe reactions post vaccine: Marland Kitchen Difficulty breathing  . Swelling of face and throat  . A fast heartbeat  . A bad rash all over body  . Dizziness and weakness   Immunizations Administered    Name Date Dose VIS Date Route   Moderna COVID-19 Vaccine 04/08/2019 12:23 PM 0.5 mL 12/22/2018 Intramuscular   Manufacturer: Moderna   Lot: [id number]   [id number]

## 2019-05-11 ENCOUNTER — Ambulatory Visit: Payer: 59 | Attending: Family

## 2019-05-11 DIAGNOSIS — Z23 Encounter for immunization: Secondary | ICD-10-CM

## 2019-05-11 NOTE — Progress Notes (Signed)
   Covid-19 Vaccination Clinic  Name:  Connor Thomas    MRN: [id number] DOB: 26-Jan-1960  05/11/2019  [name] was observed post Covid-19 immunization for 15 minutes without incident. He was provided with Vaccine Information Sheet and instruction to access the V-Safe system.   [name] was instructed to call 911 with any severe reactions post vaccine: Marland Kitchen Difficulty breathing  . Swelling of face and throat  . A fast heartbeat  . A bad rash all over body  . Dizziness and weakness   Immunizations Administered    Name Date Dose VIS Date Route   Moderna COVID-19 Vaccine 05/11/2019 11:20 AM 0.5 mL 12/2018 Intramuscular   Manufacturer: Moderna   Lot: [id number]   [id number]

## 2019-08-09 ENCOUNTER — Other Ambulatory Visit: Payer: Self-pay | Admitting: Family Medicine

## 2019-08-10 NOTE — Telephone Encounter (Signed)
Please advise. Rx is not on the med list? °

## 2019-11-25 ENCOUNTER — Encounter: Payer: 59 | Admitting: Family Medicine

## 2019-11-28 ENCOUNTER — Other Ambulatory Visit: Payer: Self-pay | Admitting: Family Medicine

## 2019-12-14 ENCOUNTER — Encounter: Payer: Self-pay | Admitting: Family Medicine

## 2019-12-14 ENCOUNTER — Ambulatory Visit (INDEPENDENT_AMBULATORY_CARE_PROVIDER_SITE_OTHER): Payer: 59 | Admitting: Family Medicine

## 2019-12-14 ENCOUNTER — Other Ambulatory Visit: Payer: Self-pay

## 2019-12-14 VITALS — BP 124/70 | HR 74 | Temp 98.6°F | Ht 67.25 in | Wt 215.8 lb

## 2019-12-14 DIAGNOSIS — Z0000 Encounter for general adult medical examination without abnormal findings: Secondary | ICD-10-CM

## 2019-12-14 DIAGNOSIS — Z23 Encounter for immunization: Secondary | ICD-10-CM

## 2019-12-14 MED ORDER — SILDENAFIL CITRATE 20 MG PO TABS: 20.0000 mg | ORAL_TABLET | Freq: Three times a day (TID) | ORAL | 10 refills | Status: DC

## 2019-12-14 MED ORDER — ALBUTEROL SULFATE HFA 108 (90 BASE) MCG/ACT IN AERS: 2.0000 | INHALATION_SPRAY | RESPIRATORY_TRACT | 5 refills | Status: DC | PRN

## 2019-12-14 MED ORDER — OMEPRAZOLE 20 MG PO CPDR: 20.0000 mg | DELAYED_RELEASE_CAPSULE | Freq: Every day | ORAL | 11 refills | Status: DC

## 2019-12-14 MED ORDER — CITALOPRAM HYDROBROMIDE 20 MG PO TABS: 20.0000 mg | ORAL_TABLET | Freq: Every day | ORAL | 11 refills | Status: DC

## 2019-12-14 MED ORDER — SIMVASTATIN 40 MG PO TABS: 40.0000 mg | ORAL_TABLET | Freq: Every day | ORAL | 11 refills | Status: DC

## 2019-12-14 NOTE — Progress Notes (Signed)
   Subjective:    Patient ID: Connor Thomas, male    DOB: January 27, 1960, 59 y.o.   MRN: [id number]  HPI Here for a well exam. He feels great. He has lost 30 lbs and he has made it 2 years without smoking.    Review of Systems  Constitutional: Negative.   HENT: Negative.   Eyes: Negative.   Respiratory: Negative.   Cardiovascular: Negative.   Gastrointestinal: Negative.   Genitourinary: Negative.   Musculoskeletal: Negative.   Skin: Negative.   Neurological: Negative.   Psychiatric/Behavioral: Negative.        Objective:   Physical Exam Constitutional:      General: He is not in acute distress.    Appearance: He is well-developed. He is not diaphoretic.  HENT:     Head: Normocephalic and atraumatic.     Right Ear: External ear normal.     Left Ear: External ear normal.     Nose: Nose normal.     Mouth/Throat:     Pharynx: No oropharyngeal exudate.  Eyes:     General: No scleral icterus.       Right eye: No discharge.        Left eye: No discharge.     Conjunctiva/sclera: Conjunctivae normal.     Pupils: Pupils are equal, round, and reactive to light.  Neck:     Thyroid: No thyromegaly.     Vascular: No JVD.     Trachea: No tracheal deviation.  Cardiovascular:     Rate and Rhythm: Normal rate and regular rhythm.     Heart sounds: Normal heart sounds. No murmur heard.  No friction rub. No gallop.   Pulmonary:     Effort: Pulmonary effort is normal. No respiratory distress.     Breath sounds: Normal breath sounds. No wheezing or rales.  Chest:     Chest wall: No tenderness.  Abdominal:     General: Bowel sounds are normal. There is no distension.     Palpations: Abdomen is soft. There is no mass.     Tenderness: There is no abdominal tenderness. There is no guarding or rebound.  Genitourinary:    Penis: Normal. No tenderness.      Testes: Normal.     Prostate: Normal.     Rectum: Normal. Guaiac result negative.  Musculoskeletal:        General: No tenderness.  Normal range of motion.     Cervical back: Neck supple.  Lymphadenopathy:     Cervical: No cervical adenopathy.  Skin:    General: Skin is warm and dry.     Coloration: Skin is not pale.     Findings: No erythema or rash.  Neurological:     Mental Status: He is alert and oriented to person, place, and time.     Cranial Nerves: No cranial nerve deficit.     Motor: No abnormal muscle tone.     Coordination: Coordination normal.     Deep Tendon Reflexes: Reflexes are normal and symmetric. Reflexes normal.  Psychiatric:        Behavior: Behavior normal.        Thought Content: Thought content normal.        Judgment: Judgment normal.           Assessment & Plan:  Well exam. We discussed diet and exercise. Get fasting labs.  [name], MD

## 2019-12-14 NOTE — Addendum Note (Signed)
Addended by: Konrad Saha on: 12/14/2019 10:19 AM   Modules accepted: Orders

## 2019-12-14 NOTE — Addendum Note (Signed)
Addended by: Janann Colonel on: 12/14/2019 10:10 AM   Modules accepted: Orders

## 2019-12-15 LAB — CBC WITH DIFFERENTIAL/PLATELET
Absolute Monocytes: 638 cells/uL (ref 200–950)
Basophils Absolute: 61 cells/uL (ref 0–200)
Basophils Relative: 0.8 %
Eosinophils Absolute: 190 cells/uL (ref 15–500)
Eosinophils Relative: 2.5 %
HCT: 44 % (ref 38.5–50.0)
Hemoglobin: 14.9 g/dL (ref 13.2–17.1)
Lymphs Abs: 2941 cells/uL (ref 850–3900)
MCH: 30.7 pg (ref 27.0–33.0)
MCHC: 33.9 g/dL (ref 32.0–36.0)
MCV: 90.5 fL (ref 80.0–100.0)
MPV: 10 fL (ref 7.5–12.5)
Monocytes Relative: 8.4 %
Neutro Abs: 3770 cells/uL (ref 1500–7800)
Neutrophils Relative %: 49.6 %
Platelets: 323 10*3/uL (ref 140–400)
RBC: 4.86 10*6/uL (ref 4.20–5.80)
RDW: 12.6 % (ref 11.0–15.0)
Total Lymphocyte: 38.7 %
WBC: 7.6 10*3/uL (ref 3.8–10.8)

## 2019-12-15 LAB — HEPATIC FUNCTION PANEL
AG Ratio: 1.7 (calc) (ref 1.0–2.5)
ALT: 27 U/L (ref 9–46)
AST: 23 U/L (ref 10–35)
Albumin: 4.8 g/dL (ref 3.6–5.1)
Alkaline phosphatase (APISO): 77 U/L (ref 35–144)
Bilirubin, Direct: 0.1 mg/dL (ref 0.0–0.2)
Globulin: 2.8 g/dL (calc) (ref 1.9–3.7)
Indirect Bilirubin: 0.6 mg/dL (calc) (ref 0.2–1.2)
Total Bilirubin: 0.7 mg/dL (ref 0.2–1.2)
Total Protein: 7.6 g/dL (ref 6.1–8.1)

## 2019-12-15 LAB — LIPID PANEL
Cholesterol: 178 mg/dL (ref <200)
HDL: 55 mg/dL (ref >=40)
LDL Cholesterol (Calc): 103 mg/dL (calc) — ABNORMAL HIGH
Non-HDL Cholesterol (Calc): 123 mg/dL (calc) (ref <130)
Total CHOL/HDL Ratio: 3.2 (calc) (ref <5.0)
Triglycerides: 100 mg/dL (ref <150)

## 2019-12-15 LAB — BASIC METABOLIC PANEL WITH GFR
BUN: 15 mg/dL (ref 7–25)
CO2: 31 mmol/L (ref 20–32)
Calcium: 10.4 mg/dL — ABNORMAL HIGH (ref 8.6–10.3)
Chloride: 100 mmol/L (ref 98–110)
Creat: 0.91 mg/dL (ref 0.70–1.33)
GFR, Est African American: 107 mL/min/{1.73_m2} (ref >=60)
GFR, Est Non African American: 92 mL/min/{1.73_m2} (ref >=60)
Glucose, Bld: 102 mg/dL — ABNORMAL HIGH (ref 65–99)
Potassium: 4.9 mmol/L (ref 3.5–5.3)
Sodium: 139 mmol/L (ref 135–146)

## 2019-12-15 LAB — HEMOGLOBIN A1C
Hgb A1c MFr Bld: 5.4 % of total Hgb (ref <5.7)
Mean Plasma Glucose: 108 (calc)
eAG (mmol/L): 6 (calc)

## 2019-12-15 LAB — PSA: PSA: 0.65 ng/mL (ref <=4.0)

## 2019-12-15 LAB — TSH: TSH: 1.8 mIU/L (ref 0.40–4.50)

## 2019-12-27 ENCOUNTER — Encounter: Payer: 59 | Admitting: Family Medicine

## 2020-01-19 ENCOUNTER — Other Ambulatory Visit: Payer: Self-pay | Admitting: Family Medicine

## 2020-02-23 ENCOUNTER — Other Ambulatory Visit: Payer: Self-pay | Admitting: Family Medicine

## 2020-04-25 ENCOUNTER — Other Ambulatory Visit: Payer: Self-pay

## 2020-04-25 ENCOUNTER — Ambulatory Visit: Payer: 59 | Admitting: Family Medicine

## 2020-04-25 ENCOUNTER — Encounter: Payer: Self-pay | Admitting: Family Medicine

## 2020-04-25 VITALS — BP 110/64 | HR 65 | Temp 98.4°F | Wt 210.0 lb

## 2020-04-25 DIAGNOSIS — F410 Panic disorder [episodic paroxysmal anxiety] without agoraphobia: Secondary | ICD-10-CM | POA: Diagnosis not present

## 2020-04-25 MED ORDER — LORATADINE 10 MG PO TABS: 10.0000 mg | ORAL_TABLET | Freq: Every day | ORAL | 3 refills | Status: DC

## 2020-04-25 NOTE — Progress Notes (Signed)
   Subjective:    Patient ID: Connor Thomas, male    DOB: Mar 11, 1960, 60 y.o.   MRN: [id number]  HPI Here to discuss what he thinks were anxiety attacks. He has had 4 episodes in the past 2 months of this, and the last one occurred last week. 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 He feels his heart start to pound, he gets a throbbing headache, and his vision becomes blurred. Then when he stops to calm down, these symptoms quickly resolve. There is no chest pain or SOB or nausea or sweats. After he identified the problem, he says he talked to his boss and essentially fired the client. He says he will never work with this client again, and his boss has supported him in this decision. Since last week he has felt much more relaxed and has had no problems. He still takes Celexa daily.    Review of Systems  Constitutional: Negative.   Eyes: Positive for visual disturbance.  Respiratory: Negative.   Cardiovascular: Negative.   Neurological: Positive for headaches. Negative for dizziness, tremors, seizures, syncope, facial asymmetry, speech difficulty, weakness, light-headedness and numbness.       Objective:   Physical Exam Constitutional:      Appearance: Normal appearance. He is not ill-appearing.  Cardiovascular:     Rate and Rhythm: Normal rate and regular rhythm.     Pulses: Normal pulses.     Heart sounds: Normal heart sounds.  Pulmonary:     Effort: Pulmonary effort is normal.     Breath sounds: Normal breath sounds.  Neurological:     General: No focal deficit present.     Mental Status: He is alert and oriented to person, place, and time.  Psychiatric:        Mood and Affect: Mood normal.        Behavior: Behavior normal.        Thought Content: Thought content normal.        Judgment: Judgment normal.           Assessment & Plan:  He seems to have had  some anxiety attacks, and he has identified the source of these and eliminated it. He will follow up with [place] if these symptoms return.  [name], MD

## 2020-07-06 ENCOUNTER — Encounter: Payer: Self-pay | Admitting: Family Medicine

## 2020-07-06 MED ORDER — METHYLPREDNISOLONE 4 MG PO TBPK: ORAL_TABLET | ORAL | 0 refills | Status: DC

## 2020-07-06 MED ORDER — LEVOFLOXACIN 500 MG PO TABS: 500.0000 mg | ORAL_TABLET | Freq: Every day | ORAL | 0 refills | Status: AC

## 2020-07-06 NOTE — Telephone Encounter (Signed)
I sent these meds to his [facility]

## 2020-08-17 IMAGING — DX DG CHEST 1V PORT
1 series · 1 of 1 positions shown · non-contrast
Comparison: None.

CLINICAL DATA: Cough, fever, shortness of breath and chest pain.

EXAM:
PORTABLE CHEST 1 VIEW

[chest ap]
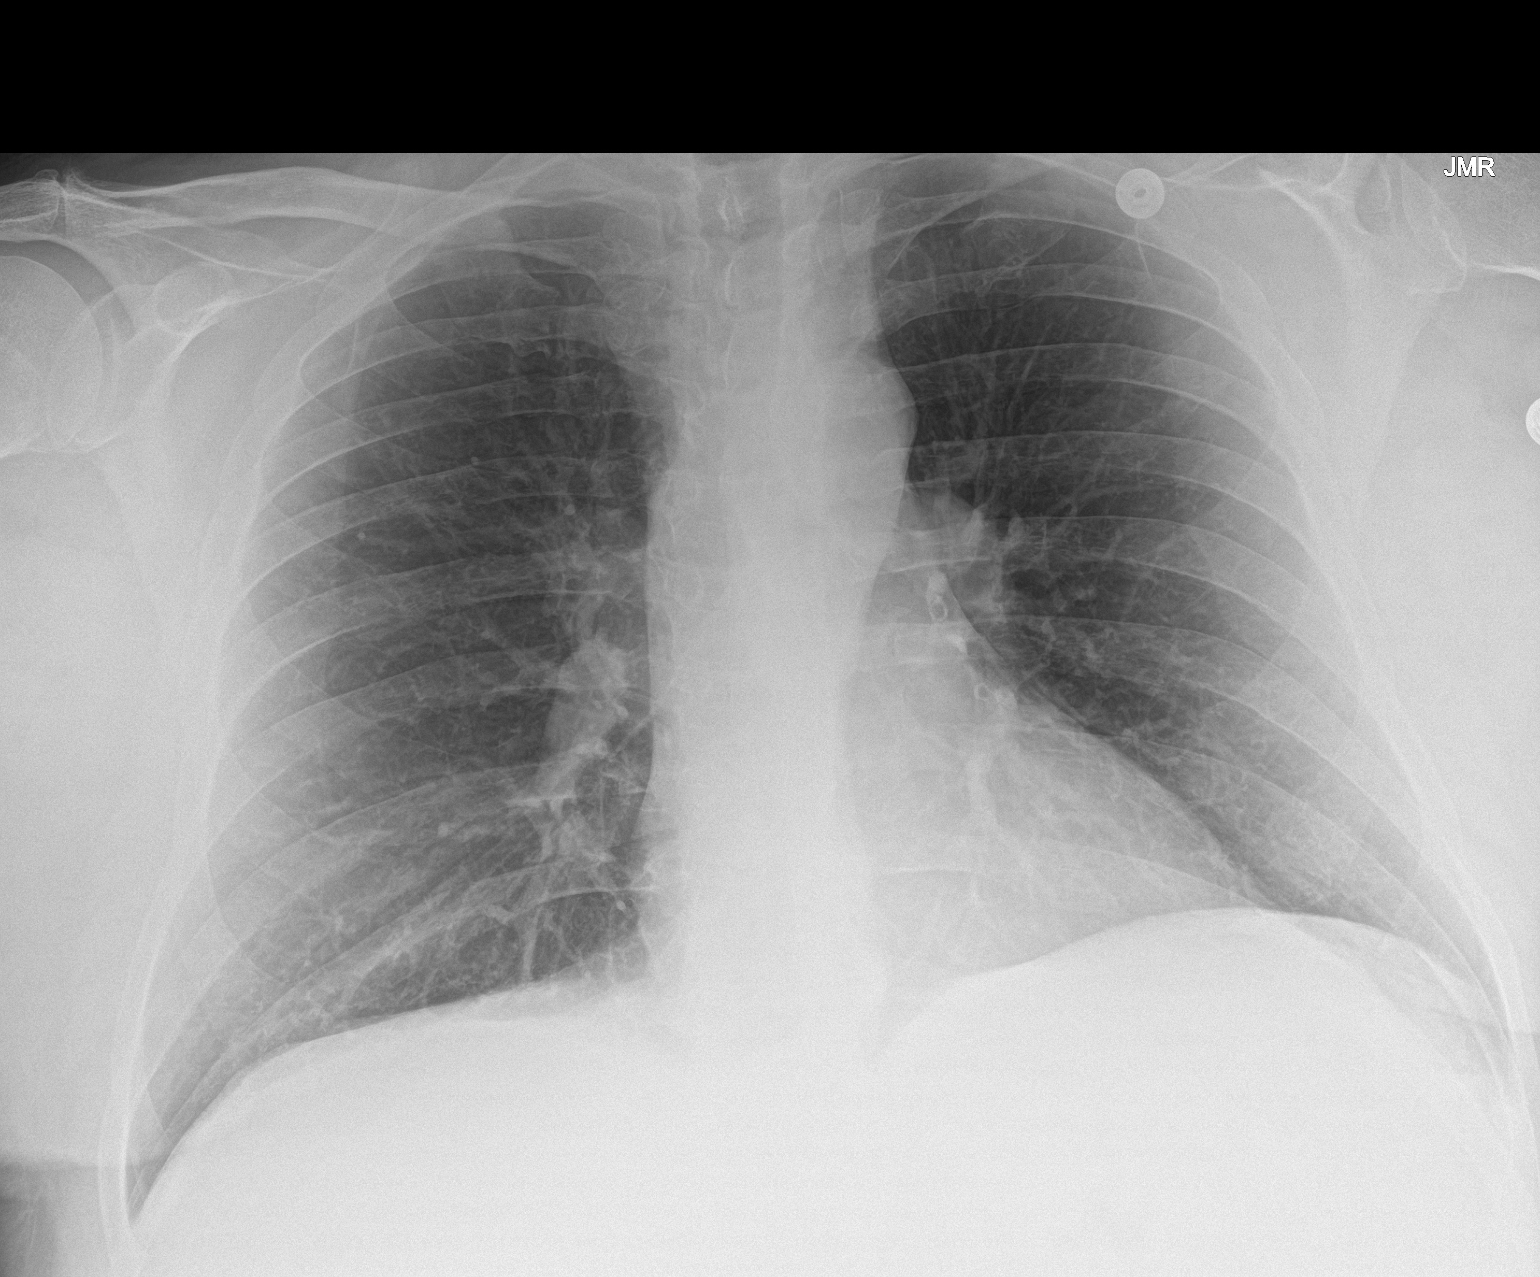

[1 of 1 positions shown; findings below may reference images not displayed]

FINDINGS: Normal sized heart. Clear lungs. Mild-to-moderate central
peribronchial thickening. Unremarkable bones.
IMPRESSION: Mild to moderate bronchitic changes.

## 2020-10-06 ENCOUNTER — Other Ambulatory Visit: Payer: Self-pay

## 2020-10-06 MED ORDER — ACYCLOVIR 200 MG PO CAPS: ORAL_CAPSULE | ORAL | 3 refills | Status: DC

## 2020-12-23 ENCOUNTER — Other Ambulatory Visit: Payer: Self-pay | Admitting: Family Medicine

## 2020-12-28 ENCOUNTER — Other Ambulatory Visit: Payer: Self-pay

## 2020-12-28 MED ORDER — SIMVASTATIN 40 MG PO TABS: 40.0000 mg | ORAL_TABLET | Freq: Every day | ORAL | 6 refills | Status: DC

## 2021-01-03 ENCOUNTER — Other Ambulatory Visit: Payer: Self-pay | Admitting: Family Medicine

## 2021-01-03 DIAGNOSIS — J302 Other seasonal allergic rhinitis: Secondary | ICD-10-CM

## 2021-02-01 ENCOUNTER — Other Ambulatory Visit: Payer: Self-pay | Admitting: Family Medicine

## 2021-02-02 NOTE — Telephone Encounter (Signed)
Last refill-10/06/20--100 capsules, 3 refills Last OV-04/25/2020  No future OV scheduled.

## 2021-02-12 ENCOUNTER — Other Ambulatory Visit: Payer: Self-pay | Admitting: Family Medicine

## 2021-02-26 ENCOUNTER — Other Ambulatory Visit: Payer: Self-pay | Admitting: Family Medicine

## 2021-02-26 DIAGNOSIS — J302 Other seasonal allergic rhinitis: Secondary | ICD-10-CM

## 2021-04-16 ENCOUNTER — Ambulatory Visit: Payer: 59 | Admitting: Family Medicine

## 2021-04-16 ENCOUNTER — Encounter: Payer: Self-pay | Admitting: Family Medicine

## 2021-04-16 VITALS — BP 110/74 | HR 68 | Temp 99.5°F | Wt 210.0 lb

## 2021-04-16 DIAGNOSIS — Z0000 Encounter for general adult medical examination without abnormal findings: Secondary | ICD-10-CM | POA: Diagnosis not present

## 2021-04-16 LAB — CBC WITH DIFFERENTIAL/PLATELET
Basophils Absolute: 0.1 10*3/uL (ref 0.0–0.1)
Basophils Relative: 0.9 % (ref 0.0–3.0)
Eosinophils Absolute: 0.3 10*3/uL (ref 0.0–0.7)
Eosinophils Relative: 3.6 % (ref 0.0–5.0)
HCT: 38.3 % — ABNORMAL LOW (ref 39.0–52.0)
Hemoglobin: 13 g/dL (ref 13.0–17.0)
Lymphocytes Relative: 41.2 % (ref 12.0–46.0)
Lymphs Abs: 2.9 10*3/uL (ref 0.7–4.0)
MCHC: 34.1 g/dL (ref 30.0–36.0)
MCV: 88 fl (ref 78.0–100.0)
Monocytes Absolute: 0.6 10*3/uL (ref 0.1–1.0)
Monocytes Relative: 8.3 % (ref 3.0–12.0)
Neutro Abs: 3.2 10*3/uL (ref 1.4–7.7)
Neutrophils Relative %: 46 % (ref 43.0–77.0)
Platelets: 298 10*3/uL (ref 150.0–400.0)
RBC: 4.35 Mil/uL (ref 4.22–5.81)
RDW: 13.5 % (ref 11.5–15.5)
WBC: 7 10*3/uL (ref 4.0–10.5)

## 2021-04-16 LAB — LIPID PANEL
Cholesterol: 152 mg/dL (ref 0–200)
HDL: 41.4 mg/dL (ref >39.00)
LDL Cholesterol: 97 mg/dL (ref 0–99)
NonHDL: 110.31
Total CHOL/HDL Ratio: 4
Triglycerides: 65 mg/dL (ref 0.0–149.0)
VLDL: 13 mg/dL (ref 0.0–40.0)

## 2021-04-16 LAB — BASIC METABOLIC PANEL
BUN: 17 mg/dL (ref 6–23)
CO2: 29 mEq/L (ref 19–32)
Calcium: 9.4 mg/dL (ref 8.4–10.5)
Chloride: 103 mEq/L (ref 96–112)
Creatinine, Ser: 0.94 mg/dL (ref 0.40–1.50)
GFR: 87.86 mL/min (ref >60.00)
Glucose, Bld: 101 mg/dL — ABNORMAL HIGH (ref 70–99)
Potassium: 4.6 mEq/L (ref 3.5–5.1)
Sodium: 138 mEq/L (ref 135–145)

## 2021-04-16 LAB — HEPATIC FUNCTION PANEL
ALT: 16 U/L (ref 0–53)
AST: 15 U/L (ref 0–37)
Albumin: 4.6 g/dL (ref 3.5–5.2)
Alkaline Phosphatase: 59 U/L (ref 39–117)
Bilirubin, Direct: 0.1 mg/dL (ref 0.0–0.3)
Total Bilirubin: 0.5 mg/dL (ref 0.2–1.2)
Total Protein: 6.9 g/dL (ref 6.0–8.3)

## 2021-04-16 MED ORDER — ALBUTEROL SULFATE HFA 108 (90 BASE) MCG/ACT IN AERS: 2.0000 | INHALATION_SPRAY | RESPIRATORY_TRACT | 5 refills | Status: DC | PRN

## 2021-04-16 MED ORDER — CITALOPRAM HYDROBROMIDE 20 MG PO TABS: 20.0000 mg | ORAL_TABLET | Freq: Every day | ORAL | 11 refills | Status: DC

## 2021-04-16 MED ORDER — OMEPRAZOLE 20 MG PO CPDR: 20.0000 mg | DELAYED_RELEASE_CAPSULE | Freq: Every day | ORAL | 11 refills | Status: DC

## 2021-04-16 NOTE — Progress Notes (Signed)
? ?Subjective:  ? ? Patient ID: Connor Thomas, male    DOB: 1960-05-25, 61 y.o.   MRN: [id number] ? ?HPI ?Here for a well exam. He feels fine except for some generalized fatigue. He thinks this may be from inactivity, although he wonders if this could be a holdover from having 2 different Covid-19 infections in the past 2 years. ? ?Review of Systems  ?Constitutional:  Positive for fatigue.  ?HENT: Negative.    ?Eyes: Negative.   ?Respiratory: Negative.    ?Cardiovascular: Negative.   ?Gastrointestinal: Negative.   ?Genitourinary: Negative.   ?Musculoskeletal: Negative.   ?Skin: Negative.   ?Neurological: Negative.   ?Psychiatric/Behavioral: Negative.    ? ?   ?Objective:  ? Physical Exam ?Constitutional:   ?   General: He is not in acute distress. ?   Appearance: Normal appearance. He is well-developed. He is not diaphoretic.  ?HENT:  ?   Head: Normocephalic and atraumatic.  ?   Right Ear: External ear normal.  ?   Left Ear: External ear normal.  ?   Nose: Nose normal.  ?   Mouth/Throat:  ?   Pharynx: No oropharyngeal exudate.  ?Eyes:  ?   General: No scleral icterus.    ?   Right eye: No discharge.     ?   Left eye: No discharge.  ?   Conjunctiva/sclera: Conjunctivae normal.  ?   Pupils: Pupils are equal, round, and reactive to light.  ?Neck:  ?   Thyroid: No thyromegaly.  ?   Vascular: No JVD.  ?   Trachea: No tracheal deviation.  ?Cardiovascular:  ?   Rate and Rhythm: Normal rate and regular rhythm.  ?   Heart sounds: Normal heart sounds. No murmur heard. ?  No friction rub. No gallop.  ?Pulmonary:  ?   Effort: Pulmonary effort is normal. No respiratory distress.  ?   Breath sounds: Normal breath sounds. No wheezing or rales.  ?Chest:  ?   Chest wall: No tenderness.  ?Abdominal:  ?   General: Bowel sounds are normal. There is no distension.  ?   Palpations: Abdomen is soft. There is no mass.  ?   Tenderness: There is no abdominal tenderness. There is no guarding or rebound.  ?Genitourinary: ?   Penis: Normal. No  tenderness.   ?   Testes: Normal.  ?   Prostate: Normal.  ?   Rectum: Normal. Guaiac result negative.  ?Musculoskeletal:     ?   General: No tenderness. Normal range of motion.  ?   Cervical back: Neck supple.  ?Lymphadenopathy:  ?   Cervical: No cervical adenopathy.  ?Skin: ?   General: Skin is warm and dry.  ?   Coloration: Skin is not pale.  ?   Findings: No erythema or rash.  ?Neurological:  ?   Mental Status: He is alert and oriented to person, place, and time.  ?   Cranial Nerves: No cranial nerve deficit.  ?   Motor: No abnormal muscle tone.  ?   Coordination: Coordination normal.  ?   Deep Tendon Reflexes: Reflexes are normal and symmetric. Reflexes normal.  ?Psychiatric:     ?   Behavior: Behavior normal.     ?   Thought Content: Thought content normal.     ?   Judgment: Judgment normal.  ? ? ? ? ? ?   ?Assessment & Plan:  ?Well exam. We discussed diet and exercise. Get fasting labs.  ?  [name], MD ? ? ?

## 2021-04-17 LAB — VITAMIN B12: Vitamin B-12: 292 pg/mL (ref 211–911)

## 2021-04-17 LAB — PSA: PSA: 0.4 ng/mL (ref 0.10–4.00)

## 2021-04-17 LAB — TSH: TSH: 1.35 u[IU]/mL (ref 0.35–5.50)

## 2021-04-24 ENCOUNTER — Encounter: Payer: Self-pay | Admitting: Gastroenterology

## 2021-06-04 ENCOUNTER — Ambulatory Visit (AMBULATORY_SURGERY_CENTER): Payer: 59 | Admitting: *Deleted

## 2021-06-04 VITALS — Ht 68.0 in | Wt 195.0 lb

## 2021-06-04 DIAGNOSIS — Z86010 Personal history of colonic polyps: Secondary | ICD-10-CM

## 2021-06-04 MED ORDER — NA SULFATE-K SULFATE-MG SULF 17.5-3.13-1.6 GM/177ML PO SOLN: 1.0000 | Freq: Once | ORAL | 0 refills | Status: AC

## 2021-06-04 NOTE — Progress Notes (Signed)

## 2021-06-22 ENCOUNTER — Encounter: Payer: Self-pay | Admitting: Gastroenterology

## 2021-06-25 ENCOUNTER — Ambulatory Visit (AMBULATORY_SURGERY_CENTER): Payer: 59 | Admitting: Gastroenterology

## 2021-06-25 ENCOUNTER — Encounter: Payer: Self-pay | Admitting: Gastroenterology

## 2021-06-25 VITALS — BP 111/68 | HR 64 | Temp 98.4°F | Resp 15 | Ht 67.25 in | Wt 195.0 lb

## 2021-06-25 DIAGNOSIS — K621 Rectal polyp: Secondary | ICD-10-CM | POA: Diagnosis not present

## 2021-06-25 DIAGNOSIS — K635 Polyp of colon: Secondary | ICD-10-CM

## 2021-06-25 DIAGNOSIS — D128 Benign neoplasm of rectum: Secondary | ICD-10-CM

## 2021-06-25 DIAGNOSIS — Z86010 Personal history of colonic polyps: Secondary | ICD-10-CM | POA: Diagnosis present

## 2021-06-25 DIAGNOSIS — D125 Benign neoplasm of sigmoid colon: Secondary | ICD-10-CM

## 2021-06-25 DIAGNOSIS — K6389 Other specified diseases of intestine: Secondary | ICD-10-CM | POA: Diagnosis not present

## 2021-06-25 HISTORY — PX: COLONOSCOPY: SHX174

## 2021-06-25 MED ORDER — SODIUM CHLORIDE 0.9 % IV SOLN: 500.0000 mL | Freq: Once | INTRAVENOUS | Status: DC

## 2021-06-25 NOTE — Progress Notes (Signed)
To pacu, VSS. Report to [email or web address].tb 

## 2021-06-25 NOTE — Progress Notes (Signed)
[facility] History and Physical   Primary Care Physician:  Laurey Morale, MD   Reason for Procedure:   History of colon polyps  Plan:    colonoscopy     HPI: Connor Thomas is a 61 y.o. male  here for colonoscopy surveillance - a few adenomas removed 03/2016. Patient denies any bowel symptoms at this time. No family history of colon cancer known. Otherwise feels well without any cardiopulmonary symptoms.    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 Havery Moros, adenomatous polyps, repeat in 5 yrs    ESOPHAGOGASTRODUODENOSCOPY  04/25/2006   HERNIA REPAIR     left knee scope     POLYPECTOMY     TONSILLECTOMY AND ADENOIDECTOMY     UMBILICAL HERNIA REPAIR N/A 03/25/2012   Procedure: HERNIA REPAIR-- UMBILICAL-- ADULT;  Surgeon: Pedro Earls, MD;  Location: [facility];  Service: General;  Laterality: N/A;   UMBILICAL HERNIA REPAIR N/A 05/16/2016   Procedure: LAPAROSCOPIC ASSISTED REPAIR OF RECURRENT UMBILICAL HERNIA WITH MESH;  Surgeon: Johnathan Hausen, MD;  Location: [facility];  Service: General;  Laterality: N/A;    Prior to Admission medications   Medication Sig Start Date End Date Taking? Authorizing Provider  citalopram (CELEXA) 20 MG tablet Take 1 tablet (20 mg total) by mouth daily. 04/16/21  Yes Laurey Morale, MD  Multiple Vitamin (MULTIVITAMIN ADULT PO) Take by mouth daily. Centrum silver take one daily   Yes [provider]  Naproxen Sodium (ALEVE PO) Take by mouth in the morning and at bedtime. Take one twice daily   Yes [provider]  omeprazole (PRILOSEC) 20 MG capsule Take 1 capsule (20 mg total) by mouth daily. 04/16/21  Yes Laurey Morale, MD  simvastatin (ZOCOR) 40 MG tablet Take 1 tablet (40 mg total) by mouth at bedtime. 12/28/20  Yes Laurey Morale, MD  acyclovir (ZOVIRAX) 200 MG capsule TAKE 1 CAPSULE BY MOUTH FIVE TIMES DAILY Patient not taking: Reported on 06/04/2021 02/02/21   Laurey Morale, MD  albuterol (PROVENTIL HFA) [phone] Base) MCG/ACT inhaler Inhale 2 puffs into the lungs every 4 (four) hours as needed for wheezing or shortness of breath. 04/16/21   Laurey Morale, MD  aspirin 81 MG tablet Take 81 mg by mouth daily. Patient not taking: Reported on 06/04/2021    [provider]  fluticasone Asencion Islam) 50 MCG/ACT nasal spray Use 2 spray(s) in each nostril once daily 02/26/21   Laurey Morale, MD  loratadine (CLARITIN) 10 MG tablet Take 1 tablet (10 mg total) by mouth daily. Patient not taking: Reported on 06/04/2021 04/25/20   Laurey Morale, MD  sildenafil (REVATIO) 20 MG tablet Take 1 tablet (20 mg total) by mouth 3 (three) times daily. Patient not taking: Reported on 06/04/2021 12/14/19   Laurey Morale, MD    Current Outpatient Medications  Medication Sig Dispense Refill   citalopram (CELEXA) 20  MG tablet Take 1 tablet (20 mg total) by mouth daily. 30 tablet 11   Multiple Vitamin (MULTIVITAMIN ADULT PO) Take by mouth daily. Centrum silver take one daily     Naproxen Sodium (ALEVE PO) Take by mouth in the morning and at bedtime. Take one twice daily     omeprazole (PRILOSEC) 20 MG capsule Take 1 capsule (20 mg total) by mouth daily. 30 capsule 11   simvastatin (ZOCOR) 40 MG tablet Take 1 tablet (40 mg total) by mouth at bedtime. 30 tablet 6   acyclovir (ZOVIRAX) 200 MG capsule TAKE 1 CAPSULE BY MOUTH FIVE TIMES DAILY (Patient not taking: Reported on 06/04/2021) 100 capsule 3   albuterol (PROVENTIL HFA) 108 (90 Base) MCG/ACT inhaler Inhale 2 puffs into the lungs every 4 (four) hours as needed  for wheezing or shortness of breath. 18 g 5   aspirin 81 MG tablet Take 81 mg by mouth daily. (Patient not taking: Reported on 06/04/2021)     fluticasone (FLONASE) 50 MCG/ACT nasal spray Use 2 spray(s) in each nostril once daily 16 g 0   loratadine (CLARITIN) 10 MG tablet Take 1 tablet (10 mg total) by mouth daily. (Patient not taking: Reported on 06/04/2021) 90 tablet 3   sildenafil (REVATIO) 20 MG tablet Take 1 tablet (20 mg total) by mouth 3 (three) times daily. (Patient not taking: Reported on 06/04/2021) 20 tablet 10   Current Facility-Administered Medications  Medication Dose Route Frequency Provider Last Rate Last Admin   0.9 %  sodium chloride infusion  500 mL Intravenous Once Sharif Rendell, Carlota Raspberry, MD        Allergies as of 06/25/2021 - Review Complete 06/25/2021  Allergen Reaction Noted   Other  06/04/2021    Family History  Problem Relation Age of Onset   Pancreatic cancer Father    Colon cancer Neg Hx    Colon polyps Neg Hx    Crohn's disease Neg Hx    Esophageal cancer Neg Hx    Rectal cancer Neg Hx    Stomach cancer Neg Hx     Social History   Socioeconomic History   Marital status: Married    Spouse name: Not on file   Number of children: Not on file   Years of education: Not on file   Highest education level: Not on file  Occupational History   Not on file  Tobacco Use   Smoking status: Former    Packs/day: 0.10    Types: Cigarettes    Quit date: 01/08/2018    Years since quitting: 3.4    Passive exposure: Never   Smokeless tobacco: Never  Vaping Use   Vaping Use: Never used  Substance and Sexual Activity   Alcohol use: Not Currently    Alcohol/week: 12.0 standard drinks    Types: 12 Cans of beer per week   Drug use: No   Sexual activity: Yes  Other Topics Concern   Not on file  Social History Narrative   Not on file   Social Determinants of Health   Financial Resource Strain: Not on file  Food Insecurity: Not on file  Transportation Needs:  Not on file  Physical Activity: Not on file  Stress: Not on file  Social Connections: Not on file  Intimate Partner Violence: Not on file    Review of Systems: All other review of systems negative except as mentioned in the HPI.  Physical Exam: Vital signs BP (!) 105/59   Pulse 72   Temp  98.4 F (36.9 C) (Temporal)   Ht 5' 7.25" (1.708 m)   Wt 195 lb (88.5 kg)   SpO2 96%   BMI 30.31 kg/m   General:   Alert,  Well-developed, pleasant and cooperative in NAD Lungs:  Clear throughout to auscultation.   Heart:  Regular rate and rhythm Abdomen:  Soft, nontender and nondistended.   Neuro/Psych:  Alert and cooperative. Normal mood and affect. A and O x 3  Jolly Mango, MD [facility]

## 2021-06-25 NOTE — Progress Notes (Signed)
Pt's states no medical or surgical changes since previsit or office visit. 

## 2021-06-25 NOTE — Patient Instructions (Signed)
Please read handouts provided. Continue present medications. Await pathology results.   YOU HAD AN ENDOSCOPIC PROCEDURE TODAY AT THE  ENDOSCOPY CENTER:   Refer to the procedure report that was given to you for any specific questions about what was found during the examination.  If the procedure report does not answer your questions, please call your gastroenterologist to clarify.  If you requested that your care partner not be given the details of your procedure findings, then the procedure report has been included in a sealed envelope for you to review at your convenience later.  YOU SHOULD EXPECT: Some feelings of bloating in the abdomen. Passage of more gas than usual.  Walking can help get rid of the air that was put into your GI tract during the procedure and reduce the bloating. If you had a lower endoscopy (such as a colonoscopy or flexible sigmoidoscopy) you may notice spotting of blood in your stool or on the toilet paper. If you underwent a bowel prep for your procedure, you may not have a normal bowel movement for a few days.  Please Note:  You might notice some irritation and congestion in your nose or some drainage.  This is from the oxygen used during your procedure.  There is no need for concern and it should clear up in a day or so.  SYMPTOMS TO REPORT IMMEDIATELY:  Following lower endoscopy (colonoscopy or flexible sigmoidoscopy):  Excessive amounts of blood in the stool  Significant tenderness or worsening of abdominal pains  Swelling of the abdomen that is new, acute  Fever of 100F or higher   For urgent or emergent issues, a gastroenterologist can be reached at any hour by calling [phone]. Do not use MyChart messaging for urgent concerns.    DIET:  We do recommend a small meal at first, but then you may proceed to your regular diet.  Drink plenty of fluids but you should avoid alcoholic beverages for 24 hours.  ACTIVITY:  You should plan to take it easy  for the rest of today and you should NOT DRIVE or use heavy machinery until tomorrow (because of the sedation medicines used during the test).    FOLLOW UP: Our staff will call the number listed on your records 24-72 hours following your procedure to check on you and address any questions or concerns that you may have regarding the information given to you following your procedure. If we do not reach you, we will leave a message.  We will attempt to reach you two times.  During this call, we will ask if you have developed any symptoms of COVID 19. If you develop any symptoms (ie: fever, flu-like symptoms, shortness of breath, cough etc.) before then, please call [phone].  If you test positive for Covid 19 in the 2 weeks post procedure, please call and report this information to us.    If any biopsies were taken you will be contacted by phone or by letter within the next 1-3 weeks.  Please call us at [phone] if you have not heard about the biopsies in 3 weeks.    SIGNATURES/CONFIDENTIALITY: You and/or your care partner have signed paperwork which will be entered into your electronic medical record.  These signatures attest to the fact that that the information above on your After Visit Summary has been reviewed and is understood.  Full responsibility of the confidentiality of this discharge information lies with you and/or your care-partner.  

## 2021-06-25 NOTE — Op Note (Signed)
[facility] Patient Name: Connor Thomas Procedure Date: 06/25/2021 8:27 AM MRN: [id number] Endoscopist: Remo Lipps P. Havery Moros , MD Age: 61 Referring MD:  Date of Birth: Jul 22, 1960 Gender: Male Account #: [id number] Procedure:                Colonoscopy Indications:              High risk colon cancer surveillance: Personal                            history of colonic polyps - 4 polyps removed 03/2016                            - most adenomas Medicines:                Monitored Anesthesia Care Procedure:                Pre-Anesthesia Assessment:                           - Prior to the procedure, a History and Physical                            was performed, and patient medications and                            allergies were reviewed. The patient's tolerance of                            previous anesthesia was also reviewed. The risks                            and benefits of the procedure and the sedation                            options and risks were discussed with the patient.                            All questions were answered, and informed consent                            was obtained. Prior Anticoagulants: The patient has                            taken no previous anticoagulant or antiplatelet                            agents. ASA Grade Assessment: II - A patient with                            mild systemic disease. After reviewing the risks                            and benefits, the patient was deemed in  satisfactory condition to undergo the procedure.                           After obtaining informed consent, the colonoscope                            was passed under direct vision. Throughout the                            procedure, the patient's blood pressure, pulse, and                            oxygen saturations were monitored continuously. The                            Colonoscope was introduced through the anus  and                            advanced to the the cecum, identified by                            appendiceal orifice and ileocecal valve. The                            colonoscopy was performed without difficulty. The                            patient tolerated the procedure well. The quality                            of the bowel preparation was adequate. The                            ileocecal valve, appendiceal orifice, and rectum                            were photographed. Scope In: 8:34:58 AM Scope Out: 8:54:58 AM Scope Withdrawal Time: 0 hours 12 minutes 30 seconds  Total Procedure Duration: 0 hours 20 minutes 0 seconds  Findings:                 The perianal and digital rectal examinations were                            normal.                           A single small angiodysplastic lesion was found in                            the cecum.                           A 3 mm polyp was found in the sigmoid colon. The  polyp was sessile. The polyp was removed with a                            cold snare. Resection and retrieval were complete.                           A 3 mm polyp was found in the rectum. The polyp was                            sessile. The polyp was removed with a cold snare.                            Resection and retrieval were complete.                           Internal hemorrhoids were found during retroflexion.                           There was looping in the right colon, abdominal                            pressure used to achieve cecal intubation. The exam                            was otherwise without abnormality. Complications:            No immediate complications. Estimated blood loss:                            Minimal. Estimated Blood Loss:     Estimated blood loss was minimal. Impression:               - A single colonic angiodysplastic lesion.                           - One 3 mm polyp in the sigmoid colon,  removed with                            a cold snare. Resected and retrieved.                           - One 3 mm polyp in the rectum, removed with a cold                            snare. Resected and retrieved.                           - Internal hemorrhoids.                           - The examination was otherwise normal. Recommendation:           - Patient has a contact number available for  emergencies. The signs and symptoms of potential                            delayed complications were discussed with the                            patient. Return to normal activities tomorrow.                            Written discharge instructions were provided to the                            patient.                           - Resume previous diet.                           - Continue present medications.                           - Await pathology results. Remo Lipps P. Kambre Messner, MD 06/25/2021 9:00:48 AM This report has been signed electronically.

## 2021-06-25 NOTE — Progress Notes (Signed)
Called to room to assist during endoscopic procedure.  Patient ID and intended procedure confirmed with present staff. Received instructions for my participation in the procedure from the performing physician.  

## 2021-06-26 ENCOUNTER — Telehealth: Payer: Self-pay | Admitting: *Deleted

## 2021-06-26 NOTE — Telephone Encounter (Signed)
First attempt, left VM.  

## 2021-06-26 NOTE — Telephone Encounter (Signed)
  Follow up Call-     06/25/2021    7:43 AM  Call back number  Post procedure Call Back phone  # [phone]  Permission to leave phone message Yes     Patient questions:  Do you have a fever, pain , or abdominal swelling? No. Pain Score  0 *  Have you tolerated food without any problems? Yes.    Have you been able to return to your normal activities? Yes.    Do you have any questions about your discharge instructions: Diet   No. Medications  No. Follow up visit  No.  Do you have questions or concerns about your Care? No.  Actions: * If pain score is 4 or above: No action needed, pain <4.

## 2021-10-08 ENCOUNTER — Other Ambulatory Visit: Payer: Self-pay | Admitting: Family Medicine

## 2021-10-08 DIAGNOSIS — J302 Other seasonal allergic rhinitis: Secondary | ICD-10-CM

## 2021-10-31 ENCOUNTER — Other Ambulatory Visit: Payer: Self-pay | Admitting: Family Medicine

## 2021-10-31 DIAGNOSIS — J302 Other seasonal allergic rhinitis: Secondary | ICD-10-CM

## 2021-11-29 ENCOUNTER — Telehealth: Payer: 59 | Admitting: Family Medicine

## 2021-11-29 ENCOUNTER — Encounter: Payer: Self-pay | Admitting: Family Medicine

## 2021-11-29 DIAGNOSIS — F900 Attention-deficit hyperactivity disorder, predominantly inattentive type: Secondary | ICD-10-CM

## 2021-11-29 MED ORDER — AMPHETAMINE-DEXTROAMPHET ER 10 MG PO CP24: 10.0000 mg | ORAL_CAPSULE | Freq: Every day | ORAL | 0 refills | Status: DC

## 2021-11-29 NOTE — Progress Notes (Signed)
Subjective:    Patient ID: Connor Thomas, male    DOB: April 06, 1960, 61 y.o.   MRN: [id number]  HPI Virtual Visit via Video Note  I connected with the patient on 11/29/21 at  8:30 AM EST by a video enabled telemedicine application and verified that I am speaking with the correct person using two identifiers.  Location patient: home Location provider:work or home office Persons participating in the virtual visit: patient, provider  I discussed the limitations of evaluation and management by telemedicine and the availability of in person appointments. The patient expressed understanding and agreed to proceed.   HPI: Here asking for help with his attention and focus. He says he has had trouble all his life with focusing on what he is doing and with completing tasks. He struggles in school as a youngster, and even now this causes problems on his job. I have treated his son for ADHD for years, and Edman has tried several of his son's Adderall pills. He says they worked extremely well and he had no side effects.   ROS: See pertinent positives and negatives per HPI.  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 Havery Moros, adenomatous polyps, repeat in 5 yrs    ESOPHAGOGASTRODUODENOSCOPY  04/25/2006   HERNIA REPAIR     left knee scope     POLYPECTOMY     TONSILLECTOMY AND ADENOIDECTOMY     UMBILICAL HERNIA REPAIR N/A 03/25/2012   Procedure: HERNIA REPAIR-- UMBILICAL-- ADULT;  Surgeon: Pedro Earls, MD;  Location: [facility];  Service: General;  Laterality: N/A;   UMBILICAL  HERNIA REPAIR N/A 05/16/2016   Procedure: LAPAROSCOPIC ASSISTED REPAIR OF RECURRENT UMBILICAL HERNIA WITH MESH;  Surgeon: Johnathan Hausen, MD;  Location: [facility];  Service: General;  Laterality: N/A;    Family History  Problem Relation Age of Onset   Pancreatic cancer Father    Colon cancer Neg Hx    Colon polyps Neg Hx    Crohn's disease Neg Hx    Esophageal cancer Neg Hx    Rectal cancer Neg Hx    Stomach cancer Neg Hx      Current Outpatient Medications:    albuterol (PROVENTIL HFA) 108 (90 Base) MCG/ACT inhaler, Inhale 2 puffs into the lungs every 4 (four) hours as needed for wheezing or shortness of breath., Disp: 18 g, Rfl: 5   amphetamine-dextroamphetamine (ADDERALL XR) 10 MG 24 hr capsule, Take 1 capsule (10 mg total) by mouth daily., Disp: 30 capsule, Rfl: 0   citalopram (CELEXA) 20 MG tablet, Take 1 tablet (20 mg total) by mouth daily., Disp: 30 tablet, Rfl: 11   fluticasone (FLONASE) 50 MCG/ACT nasal spray, Use 2 spray(s) in each nostril once daily, Disp: 16 g, Rfl: 0   Multiple Vitamin (MULTIVITAMIN ADULT PO), Take by mouth daily. Centrum silver take one daily, Disp: , Rfl:    Naproxen Sodium (ALEVE PO), Take by mouth in the morning  and at bedtime. Take one twice daily, Disp: , Rfl:    omeprazole (PRILOSEC) 20 MG capsule, Take 1 capsule (20 mg total) by mouth daily., Disp: 30 capsule, Rfl: 11   simvastatin (ZOCOR) 40 MG tablet, Take 1 tablet (40 mg total) by mouth at bedtime., Disp: 30 tablet, Rfl: 6   acyclovir (ZOVIRAX) 200 MG capsule, TAKE 1 CAPSULE BY MOUTH FIVE TIMES DAILY (Patient not taking: Reported on 06/04/2021), Disp: 100 capsule, Rfl: 3   aspirin 81 MG tablet, Take 81 mg by mouth daily. (Patient not taking: Reported on 06/04/2021), Disp: , Rfl:    loratadine (CLARITIN) 10 MG tablet, Take 1 tablet (10 mg total) by mouth daily. (Patient not taking: Reported on 06/04/2021), Disp: 90 tablet, Rfl: 3   sildenafil (REVATIO) 20 MG tablet, Take 1 tablet (20 mg total) by mouth 3  (three) times daily. (Patient not taking: Reported on 06/04/2021), Disp: 20 tablet, Rfl: 10  EXAM:  VITALS per patient if applicable:  GENERAL: alert, oriented, appears well and in no acute distress  HEENT: atraumatic, conjunttiva clear, no obvious abnormalities on inspection of external nose and ears  NECK: normal movements of the head and neck  LUNGS: on inspection no signs of respiratory distress, breathing rate appears normal, no obvious gross SOB, gasping or wheezing  CV: no obvious cyanosis  MS: moves all visible extremities without noticeable abnormality  PSYCH/NEURO: pleasant and cooperative, no obvious depression or anxiety, speech and thought processing grossly intact  ASSESSMENT AND PLAN: ADHD. He will try 30 days of Adderall XR 10 mg each morning. He will report back to [place] after that. [name], MD  Discussed the following assessment and plan:  No diagnosis found.     I discussed the assessment and treatment plan with the patient. The patient was provided an opportunity to ask questions and all were answered. The patient agreed with the plan and demonstrated an understanding of the instructions.   The patient was advised to call back or seek an in-person evaluation if the symptoms worsen or if the condition fails to improve as anticipated.      Review of Systems     Objective:   Physical Exam        Assessment & Plan:

## 2021-12-26 ENCOUNTER — Other Ambulatory Visit: Payer: Self-pay | Admitting: Family Medicine

## 2021-12-27 NOTE — Telephone Encounter (Signed)
Last VV-11/29/21 Last refill-11/29/21-30 tabs, 0 refills  No future OV scheduled.

## 2021-12-28 MED ORDER — AMPHETAMINE-DEXTROAMPHET ER 10 MG PO CP24: 10.0000 mg | ORAL_CAPSULE | Freq: Every day | ORAL | 0 refills | Status: DC

## 2021-12-28 NOTE — Telephone Encounter (Signed)
Done

## 2022-01-11 ENCOUNTER — Other Ambulatory Visit: Payer: Self-pay | Admitting: Family Medicine

## 2022-03-25 ENCOUNTER — Other Ambulatory Visit: Payer: Self-pay | Admitting: Family Medicine

## 2022-03-28 ENCOUNTER — Other Ambulatory Visit: Payer: Self-pay | Admitting: Family Medicine

## 2022-03-29 NOTE — Telephone Encounter (Signed)
Pt LOV was on 11/29/21 Last refill done on 02/28/22 Please advise

## 2022-04-01 MED ORDER — AMPHETAMINE-DEXTROAMPHET ER 10 MG PO CP24: 10.0000 mg | ORAL_CAPSULE | Freq: Every day | ORAL | 0 refills | Status: DC

## 2022-04-01 NOTE — Telephone Encounter (Signed)
Done

## 2022-04-22 ENCOUNTER — Other Ambulatory Visit: Payer: Self-pay | Admitting: Family Medicine

## 2022-04-26 ENCOUNTER — Encounter: Payer: Self-pay | Admitting: Family Medicine

## 2022-04-26 ENCOUNTER — Ambulatory Visit: Payer: 59 | Admitting: Family Medicine

## 2022-04-26 VITALS — BP 110/60 | HR 65 | Temp 98.7°F | Resp 16 | Ht 67.0 in | Wt 211.2 lb

## 2022-04-26 DIAGNOSIS — H9201 Otalgia, right ear: Secondary | ICD-10-CM | POA: Diagnosis not present

## 2022-04-26 DIAGNOSIS — H6091 Unspecified otitis externa, right ear: Secondary | ICD-10-CM

## 2022-04-26 MED ORDER — CIPROFLOXACIN-DEXAMETHASONE 0.3-0.1 % OT SUSP: 4.0000 [drp] | Freq: Two times a day (BID) | OTIC | 0 refills | Status: DC

## 2022-04-26 NOTE — Patient Instructions (Addendum)
A few things to remember from today's visit:  Earache on right - Plan: ciprofloxacin-dexamethasone (CIPRODEX) OTIC suspension  Otitis externa of right ear, unspecified chronicity, unspecified type - Plan: ciprofloxacin-dexamethasone (CIPRODEX) OTIC suspension There is no wax in the ears, so you do not needs them clean. If problem is persistent, please arrange follow up appt with Dr Clent Ridges.  If you need refills for medications you take chronically, please call your pharmacy. Do not use My Chart to request refills or for acute issues that need immediate attention. If you send a my chart message, it may take a few days to be addressed, specially if I am not in the office.  Please be sure medication list is accurate. If a new problem present, please set up appointment sooner than planned today.

## 2022-04-26 NOTE — Progress Notes (Addendum)
ACUTE VISIT Chief Complaint  Patient presents with   Ear Pain    Patient complains of right ear pain, x4days    HPI: [name] is a 62 y.o. male with PMHx significant for HLD,depression,GERD, allergic rhinitis,and ADHD here today complaining of *** Otalgia  There is pain in the right ear. This is a recurrent problem. The current episode started in the past 7 days. The problem has been waxing and waning. There has been no fever. The pain is mild. Associated symptoms include rhinorrhea. Pertinent negatives include no abdominal pain, coughing, ear discharge, headaches, hearing loss, neck pain, rash, sore throat or vomiting. He has tried ear drops for the symptoms. The treatment provided mild relief.   right earache that has persisted for approximately four days. This issue is noted to be recurrent, occurring once a month. Connor Thomas describes the pain as sore and constant, with an increase in intensity upon touching or swallowing. However, Connor Thomas denies any changes in hearing or the presence of drainage from the ear. There is no report of fever or recent upper-respiratory infections, but Connor Thomas confirms taking daily allergy pills for seasonal allergies.  For pain management, Connor Thomas has been alternating between Tylenol and ibuprofen. While denying any dizziness or ringing in the ear, Connor Thomas does report a slight itching and a crusty sensation inside the ear. Attempts to alleviate the discomfort included the application of oil and a product for swimmer's ear, which provided some relief.  Connor Thomas expresses concern regarding earwax buildup and mentions a routine of using a bubbling product to help flush out the ear, though this has not been done recently. Connor Thomas also reports a history of infections in both ears, with the current issue isolated to the right ear. Acknowledging some scarring changes in the tympanic membrane, Connor Thomas suggests a history of past ear infections but clarifies that the current problem is not due  to wax buildup. Review of Systems  HENT:  Positive for ear pain and rhinorrhea. Negative for ear discharge, hearing loss and sore throat.   Respiratory:  Negative for cough.   Gastrointestinal:  Negative for abdominal pain and vomiting.  Musculoskeletal:  Negative for neck pain.  Skin:  Negative for rash.  Neurological:  Negative for headaches.  See other pertinent positives and negatives in HPI.  Current Outpatient Medications on File Prior to Visit  Medication Sig Dispense Refill   acyclovir (ZOVIRAX) 200 MG capsule TAKE 1 CAPSULE BY MOUTH FIVE TIMES DAILY 100 capsule 3   albuterol (PROVENTIL HFA) 108 (90 Base) MCG/ACT inhaler Inhale 2 puffs into the lungs every 4 (four) hours as needed for wheezing or shortness of breath. 18 g 5   [START ON 06/01/2022] amphetamine-dextroamphetamine (ADDERALL XR) 10 MG 24 hr capsule Take 1 capsule (10 mg total) by mouth daily. 30 capsule 0   aspirin 81 MG tablet Take 81 mg by mouth daily.     citalopram (CELEXA) 20 MG tablet Take 1 tablet (20 mg total) by mouth daily. 30 tablet 11   fluticasone (FLONASE) 50 MCG/ACT nasal spray Use 2 spray(s) in each nostril once daily 16 g 0   loratadine (CLARITIN) 10 MG tablet Take 1 tablet (10 mg total) by mouth daily. 90 tablet 3   Multiple Vitamin (MULTIVITAMIN ADULT PO) Take by mouth daily. Centrum silver take one daily     Naproxen Sodium (ALEVE PO) Take by mouth in the morning and at bedtime. Take one twice daily     omeprazole (PRILOSEC) 20 MG capsule Take  1 capsule (20 mg total) by mouth daily. 30 capsule 11   sildenafil (REVATIO) 20 MG tablet Take 1 tablet (20 mg total) by mouth 3 (three) times daily. 20 tablet 10   simvastatin (ZOCOR) 40 MG tablet TAKE 1 TABLET BY MOUTH AT BEDTIME 30 tablet 1   No current facility-administered medications on file prior to visit.    Past Medical History:  Diagnosis Date   Allergy    Anxiety    Arthritis    shoulders,ankles,lower back   Asthma    as a child   Cancer     melanoma   Depression    Ganglion of right wrist    GERD (gastroesophageal reflux disease)    Hernia, inguinal    Hyperlipidemia    Melanoma in situ of nose 11/2012   Rotator cuff tear    LEFT   Allergies  Allergen Reactions   Other     Social History   Socioeconomic History   Marital status: Married    Spouse name: Not on file   Number of children: Not on file   Years of education: Not on file   Highest education level: Not on file  Occupational History   Not on file  Tobacco Use   Smoking status: Former    Packs/day: .1    Types: Cigarettes    Quit date: 01/08/2018    Years since quitting: 4.2    Passive exposure: Never   Smokeless tobacco: Never  Vaping Use   Vaping Use: Never used  Substance and Sexual Activity   Alcohol use: Not Currently    Alcohol/week: 12.0 standard drinks of alcohol    Types: 12 Cans of beer per week   Drug use: No   Sexual activity: Yes  Other Topics Concern   Not on file  Social History Narrative   Not on file   Social Determinants of Health   Financial Resource Strain: Patient Declined (04/24/2022)   Overall Financial Resource Strain (CARDIA)    Difficulty of Paying Living Expenses: Patient declined  Food Insecurity: Patient Declined (04/24/2022)   Hunger Vital Sign    Worried About Running Out of Food in the Last Year: Patient declined    Ran Out of Food in the Last Year: Patient declined  Transportation Needs: Patient Declined (04/24/2022)   PRAPARE - Administrator, Civil ServiceTransportation    Lack of Transportation (Medical): Patient declined    Lack of Transportation (Non-Medical): Patient declined  Physical Activity: Insufficiently Active (04/24/2022)   Exercise Vital Sign    Days of Exercise per Week: 3 days    Minutes of Exercise per Session: 30 min  Stress: Stress Concern Present (04/24/2022)   [employer] - Occupational Stress Questionnaire    Feeling of Stress : To some extent  Social Connections: Unknown (04/24/2022)    Social Connection and Isolation Panel [NHANES]    Frequency of Communication with Friends and Family: Patient declined    Frequency of Social Gatherings with Friends and Family: Patient declined    Attends Religious Services: Patient declined    Database administratorActive Member of Clubs or Organizations: Patient declined    Attends BankerClub or Organization Meetings: Not on file    Marital Status: Married    Vitals:   04/26/22 1508  BP: 110/60  Pulse: 65  Resp: 16  Temp: 98.7 F (37.1 C)  SpO2: 98%   Body mass index is 33.08 kg/m.  Physical Exam Vitals and nursing note reviewed.  Constitutional:  General: He is not in acute distress.    Appearance: He is well-developed. He is not ill-appearing.  HENT:     Head: Normocephalic and atraumatic.     Right Ear: External ear normal. Tympanic membrane is scarred.     Left Ear: Tympanic membrane, ear canal and external ear normal.     Ears:     Comments: Right ear canal a small area of erythema and scaly changes.    Nose: Rhinorrhea present.     Right Sinus: No maxillary sinus tenderness or frontal sinus tenderness.     Left Sinus: No maxillary sinus tenderness or frontal sinus tenderness.  Eyes:     Conjunctiva/sclera: Conjunctivae normal.  Cardiovascular:     Rate and Rhythm: Normal rate and regular rhythm.     Heart sounds: No murmur heard. Pulmonary:     Effort: Pulmonary effort is normal. No respiratory distress.     Breath sounds: Normal breath sounds. No stridor.  Lymphadenopathy:     Head:     Right side of head: No submandibular adenopathy.     Left side of head: No submandibular adenopathy.     Cervical: No cervical adenopathy.  Skin:    General: Skin is warm.     Findings: No erythema or rash.  Neurological:     Mental Status: He is alert and oriented to person, place, and time.  Psychiatric:     Comments: Well groomed, good eye contact.     ASSESSMENT AND PLAN: Earache on right -     Ciprofloxacin-dexAMETHasone; Place 4  drops into the right ear 2 (two) times daily.  Dispense: 7.5 mL; Refill: 0  Otitis externa of right ear, unspecified chronicity, unspecified type -     Ciprofloxacin-dexAMETHasone; Place 4 drops into the right ear 2 (two) times daily.  Dispense: 7.5 mL; Refill: 0    Return if symptoms worsen or fail to improve.  Rubel Heckard G. [place], MD  [facility]. [place] office.

## 2022-05-11 ENCOUNTER — Other Ambulatory Visit: Payer: Self-pay | Admitting: Family Medicine

## 2022-06-12 NOTE — Progress Notes (Unsigned)
ACUTE VISIT Chief Complaint  Patient presents with   Back Pain    Tightness and soreness along the mid back under ribs. Has been using ibuprofen for pain relief. No known injury, can feel it at night time and when bending or moving certain ways.    urine odor   HPI: Connor Thomas is a 62 y.o. male with PMHx significant for depression, hyperlipidemia, and ADHD here today complaining of mid back pain that began a couple of months ago. Initially, the pain was intermittent and felt like muscle pain around the left posterior rib cage area, for which he occasionally treated with ibuprofen. Back Pain This is a recurrent problem. The current episode started more than 1 month ago. The problem occurs intermittently. The problem has been gradually improving since onset. The pain is present in the thoracic spine. The pain does not radiate. The pain is moderate. Stiffness is present All day. Pertinent negatives include no abdominal pain, bladder incontinence, bowel incontinence, chest pain, dysuria, fever, headaches, leg pain, numbness, paresthesias, pelvic pain, perianal numbness, weakness or weight loss. He has tried NSAIDs for the symptoms. The treatment provided mild relief.   Over the past two weeks, the pain has intensified, manifesting as tightness and soreness across his back, exacerbated by bending over,moving in bed, or coughing. He notes an improvement in the past couple of days, with reduced tightness and a decrease in the need for ibuprofen. Reports occasional cough with minimal sputum production, family members have been sick. Negative for hemoptysis, SOB,or wheezing.  He has hx of lower back pain and she is currently managing his symptoms with ibuprofen, which he has used regularly for years as needed and before engaging in activities like golf, which he reports doing recently (18 holds) without pain.  He also expresses concern about his urine, which recently had an unusual smell and color,  prompting worries about potential kidney issues.  Negative for other associated urinary symptoms. He has increased fluid/water intake and describes his urine as normally clear, it has been yellowish recently.  Denies gross hematuria or form in urine.  Reviewing records, there is a past diagnosis of hematuria from 2016, states that he is does not recall such Dx.  He denies any associated symptoms like abdominal pain, nausea, or vomiting.   Review of Systems  Constitutional:  Negative for activity change, appetite change, fever and weight loss.  HENT:  Negative for sore throat and trouble swallowing.   Respiratory:  Negative for chest tightness.   Cardiovascular:  Negative for chest pain, palpitations and leg swelling.  Gastrointestinal:  Negative for abdominal pain and bowel incontinence.       No changes in bowel habits.  Genitourinary:  Negative for bladder incontinence, decreased urine volume, difficulty urinating, dysuria, genital sores, pelvic pain, penile discharge, penile pain and testicular pain.  Musculoskeletal:  Positive for back pain.  Skin:  Negative for rash.  Neurological:  Negative for weakness, numbness, headaches and paresthesias.  See other pertinent positives and negatives in HPI.  Current Outpatient Medications on File Prior to Visit  Medication Sig Dispense Refill   acyclovir (ZOVIRAX) 200 MG capsule TAKE 1 CAPSULE BY MOUTH FIVE TIMES DAILY 100 capsule 3   albuterol (PROVENTIL HFA) 108 (90 Base) MCG/ACT inhaler Inhale 2 puffs into the lungs every 4 (four) hours as needed for wheezing or shortness of breath. 18 g 5   amphetamine-dextroamphetamine (ADDERALL XR) 10 MG 24 hr capsule Take 1 capsule (10 mg total) by mouth daily.  30 capsule 0   aspirin 81 MG tablet Take 81 mg by mouth daily.     ciprofloxacin-dexamethasone (CIPRODEX) OTIC suspension Place 4 drops into the right ear 2 (two) times daily. 7.5 mL 0   citalopram (CELEXA) 20 MG tablet Take 1 tablet by mouth once  daily 30 tablet 0   fluticasone (FLONASE) 50 MCG/ACT nasal spray Use 2 spray(s) in each nostril once daily 16 g 0   loratadine (CLARITIN) 10 MG tablet Take 1 tablet (10 mg total) by mouth daily. 90 tablet 3   Multiple Vitamin (MULTIVITAMIN ADULT PO) Take by mouth daily. Centrum silver take one daily     Naproxen Sodium (ALEVE PO) Take by mouth in the morning and at bedtime. Take one twice daily     omeprazole (PRILOSEC) 20 MG capsule Take 1 capsule by mouth once daily 30 capsule 0   sildenafil (REVATIO) 20 MG tablet Take 1 tablet (20 mg total) by mouth 3 (three) times daily. 20 tablet 10   simvastatin (ZOCOR) 40 MG tablet TAKE 1 TABLET BY MOUTH AT BEDTIME 30 tablet 1   No current facility-administered medications on file prior to visit.   Past Medical History:  Diagnosis Date   Allergy    Anxiety    Arthritis    shoulders,ankles,lower back   Asthma    as a child   Cancer (HCC)    melanoma   Depression    Ganglion of right wrist    GERD (gastroesophageal reflux disease)    Hernia, inguinal    Hyperlipidemia    Melanoma in situ of nose (HCC) 11/2012   Rotator cuff tear    LEFT   Allergies  Allergen Reactions   Other    Social History   Socioeconomic History   Marital status: Married    Spouse name: Not on file   Number of children: Not on file   Years of education: Not on file   Highest education level: Not on file  Occupational History   Not on file  Tobacco Use   Smoking status: Former    Packs/day: .1    Types: Cigarettes    Quit date: 01/08/2018    Years since quitting: 4.4    Passive exposure: Never   Smokeless tobacco: Never  Vaping Use   Vaping Use: Never used  Substance and Sexual Activity   Alcohol use: Not Currently    Alcohol/week: 12.0 standard drinks of alcohol    Types: 12 Cans of beer per week   Drug use: No   Sexual activity: Yes  Other Topics Concern   Not on file  Social History Narrative   Not on file   Social Determinants of Health    Financial Resource Strain: Patient Declined (04/24/2022)   Overall Financial Resource Strain (CARDIA)    Difficulty of Paying Living Expenses: Patient declined  Food Insecurity: Patient Declined (04/24/2022)   Hunger Vital Sign    Worried About Running Out of Food in the Last Year: Patient declined    Ran Out of Food in the Last Year: Patient declined  Transportation Needs: Patient Declined (04/24/2022)   PRAPARE - Administrator, Civil Service (Medical): Patient declined    Lack of Transportation (Non-Medical): Patient declined  Physical Activity: Insufficiently Active (04/24/2022)   Exercise Vital Sign    Days of Exercise per Week: 3 days    Minutes of Exercise per Session: 30 min  Stress: Stress Concern Present (04/24/2022)   [facility] [employer]  [employer] - Occupational Stress Questionnaire    Feeling of Stress : To some extent  Social Connections: Unknown (04/24/2022)   Social Connection and Isolation Panel [NHANES]    Frequency of Communication with Friends and Family: Patient declined    Frequency of Social Gatherings with Friends and Family: Patient declined    Attends Religious Services: Patient declined    Database administrator or Organizations: Patient declined    Attends Banker Meetings: Not on file    Marital Status: Married   Vitals:   06/14/22 0824  BP: 126/70  Pulse: 88  Resp: 16  SpO2: 96%   Body mass index is 32.62 kg/m.  Physical Exam Vitals and nursing note reviewed.  Constitutional:      General: He is not in acute distress.    Appearance: He is well-developed.  HENT:     Head: Normocephalic and atraumatic.  Eyes:     Conjunctiva/sclera: Conjunctivae normal.  Cardiovascular:     Rate and Rhythm: Normal rate and regular rhythm.     Heart sounds: No murmur heard. Pulmonary:     Effort: Pulmonary effort is normal. No respiratory distress.     Breath sounds: Normal breath sounds.  Abdominal:     Palpations: Abdomen is  soft. There is no mass.     Tenderness: There is no abdominal tenderness. There is no right CVA tenderness or left CVA tenderness.  Musculoskeletal:     Cervical back: No tenderness.     Thoracic back: Tenderness present.     Lumbar back: No tenderness or bony tenderness. Normal range of motion.       Back:     Right lower leg: No edema.     Left lower leg: No edema.  Skin:    General: Skin is warm.     Findings: No erythema.  Neurological:     Mental Status: He is alert and oriented to person, place, and time.     Gait: Gait normal.  Psychiatric:        Mood and Affect: Mood and affect normal.   ASSESSMENT AND PLAN:  Mr. Lorance was seen today for concerns about urine other and left-sided back pain.  Abnormal urine odor No other associated symptom. Continue adequate hydration. Further recommendations according to UA result.  -     Urinalysis w microscopic + reflex cultur  Left-sided thoracic back pain, unspecified chronicity Has improved for the past couple days. We discussed possible etiologies, history and examination do not suggest a serious process. Advise caution with frequent ibuprofen use, we discussed some side effects. Recommend topical IcyHot, Aspercreme, or IcyHot patch. Monitor for new symptoms. He has seen chiropractor in the past for lower back pain, he can arrange follow-up appointment to discuss treatment options if pain is persistent.  Hematuria of undiagnosed cause Hx of microscopic hematuria, intermittent trace to 1+ We discussed Dx and prognosis.  -     Urinalysis w microscopic + reflex cultur  Return if symptoms worsen or fail to improve.  Cortlandt Capuano G. [place], MD  [facility]. [place] office.

## 2022-06-14 ENCOUNTER — Encounter: Payer: Self-pay | Admitting: Family Medicine

## 2022-06-14 ENCOUNTER — Ambulatory Visit: Payer: 59 | Admitting: Family Medicine

## 2022-06-14 VITALS — BP 126/70 | HR 88 | Resp 16 | Ht 67.0 in | Wt 208.2 lb

## 2022-06-14 DIAGNOSIS — R8290 Unspecified abnormal findings in urine: Secondary | ICD-10-CM | POA: Diagnosis not present

## 2022-06-14 DIAGNOSIS — R319 Hematuria, unspecified: Secondary | ICD-10-CM

## 2022-06-14 DIAGNOSIS — M546 Pain in thoracic spine: Secondary | ICD-10-CM

## 2022-06-14 NOTE — Patient Instructions (Addendum)
A few things to remember from today's visit:  Abnormal urine odor - Plan: Urinalysis, Routine w reflex microscopic  Left-sided thoracic back pain, unspecified chronicity Pain in back seems to be musculoskeletal. Caution with frequent Ibuprofen use. Topical icyhot or a patch will help. You can see your chiropractor to discuss other treatment options. Monitor for new symptoms.  Do not use My Chart to request refills or for acute issues that need immediate attention. If you send a my chart message, it may take a few days to be addressed, specially if I am not in the office.  Please be sure medication list is accurate. If a new problem present, please set up appointment sooner than planned today.

## 2022-06-15 LAB — URINALYSIS W MICROSCOPIC + REFLEX CULTURE
Bacteria, UA: NONE SEEN /HPF
Bilirubin Urine: NEGATIVE
Glucose, UA: NEGATIVE
Hgb urine dipstick: NEGATIVE
Hyaline Cast: NONE SEEN /LPF
Ketones, ur: NEGATIVE
Leukocyte Esterase: NEGATIVE
Nitrites, Initial: NEGATIVE
Protein, ur: NEGATIVE
RBC / HPF: NONE SEEN /HPF (ref 0–2)
Specific Gravity, Urine: 1.005 (ref 1.001–1.035)
Squamous Epithelial / HPF: NONE SEEN /HPF (ref <=5)
WBC, UA: NONE SEEN /HPF (ref 0–5)
pH: 6 (ref 5.0–8.0)

## 2022-06-15 LAB — NO CULTURE INDICATED

## 2022-06-24 ENCOUNTER — Encounter: Payer: 59 | Admitting: Family Medicine

## 2022-06-30 ENCOUNTER — Other Ambulatory Visit: Payer: Self-pay | Admitting: Family Medicine

## 2022-07-08 ENCOUNTER — Encounter: Payer: Self-pay | Admitting: Family Medicine

## 2022-07-08 ENCOUNTER — Ambulatory Visit (INDEPENDENT_AMBULATORY_CARE_PROVIDER_SITE_OTHER): Payer: 59 | Admitting: Family Medicine

## 2022-07-08 VITALS — BP 118/70 | HR 68 | Temp 98.2°F | Ht 67.25 in | Wt 211.0 lb

## 2022-07-08 DIAGNOSIS — Z23 Encounter for immunization: Secondary | ICD-10-CM | POA: Diagnosis not present

## 2022-07-08 DIAGNOSIS — Z0000 Encounter for general adult medical examination without abnormal findings: Secondary | ICD-10-CM | POA: Diagnosis not present

## 2022-07-08 LAB — BASIC METABOLIC PANEL
BUN: 14 mg/dL (ref 6–23)
CO2: 28 mEq/L (ref 19–32)
Calcium: 9.2 mg/dL (ref 8.4–10.5)
Chloride: 103 mEq/L (ref 96–112)
Creatinine, Ser: 0.89 mg/dL (ref 0.40–1.50)
GFR: 92.08 mL/min (ref >60.00)
Glucose, Bld: 106 mg/dL — ABNORMAL HIGH (ref 70–99)
Potassium: 4.4 mEq/L (ref 3.5–5.1)
Sodium: 138 mEq/L (ref 135–145)

## 2022-07-08 LAB — HEPATIC FUNCTION PANEL
ALT: 19 U/L (ref 0–53)
AST: 15 U/L (ref 0–37)
Albumin: 4.4 g/dL (ref 3.5–5.2)
Alkaline Phosphatase: 61 U/L (ref 39–117)
Bilirubin, Direct: 0.1 mg/dL (ref 0.0–0.3)
Total Bilirubin: 0.4 mg/dL (ref 0.2–1.2)
Total Protein: 6.9 g/dL (ref 6.0–8.3)

## 2022-07-08 LAB — LIPID PANEL
Cholesterol: 171 mg/dL (ref 0–200)
HDL: 41.9 mg/dL (ref >39.00)
LDL Cholesterol: 111 mg/dL — ABNORMAL HIGH (ref 0–99)
NonHDL: 129.5
Total CHOL/HDL Ratio: 4
Triglycerides: 92 mg/dL (ref 0.0–149.0)
VLDL: 18.4 mg/dL (ref 0.0–40.0)

## 2022-07-08 LAB — CBC WITH DIFFERENTIAL/PLATELET
Basophils Absolute: 0.1 10*3/uL (ref 0.0–0.1)
Basophils Relative: 1 % (ref 0.0–3.0)
Eosinophils Absolute: 0.3 10*3/uL (ref 0.0–0.7)
Eosinophils Relative: 3.9 % (ref 0.0–5.0)
HCT: 40.4 % (ref 39.0–52.0)
Hemoglobin: 13.8 g/dL (ref 13.0–17.0)
Lymphocytes Relative: 40.8 % (ref 12.0–46.0)
Lymphs Abs: 2.7 10*3/uL (ref 0.7–4.0)
MCHC: 34.2 g/dL (ref 30.0–36.0)
MCV: 87.9 fl (ref 78.0–100.0)
Monocytes Absolute: 0.5 10*3/uL (ref 0.1–1.0)
Monocytes Relative: 8.3 % (ref 3.0–12.0)
Neutro Abs: 3 10*3/uL (ref 1.4–7.7)
Neutrophils Relative %: 46 % (ref 43.0–77.0)
Platelets: 301 10*3/uL (ref 150.0–400.0)
RBC: 4.6 Mil/uL (ref 4.22–5.81)
RDW: 13.4 % (ref 11.5–15.5)
WBC: 6.6 10*3/uL (ref 4.0–10.5)

## 2022-07-08 LAB — HEMOGLOBIN A1C: Hgb A1c MFr Bld: 5.7 % (ref 4.6–6.5)

## 2022-07-08 LAB — TSH: TSH: 1.73 u[IU]/mL (ref 0.35–5.50)

## 2022-07-08 LAB — PSA: PSA: 0.53 ng/mL (ref 0.10–4.00)

## 2022-07-08 MED ORDER — CITALOPRAM HYDROBROMIDE 20 MG PO TABS: 20.0000 mg | ORAL_TABLET | Freq: Every day | ORAL | 3 refills | Status: DC

## 2022-07-08 MED ORDER — AMPHETAMINE-DEXTROAMPHET ER 10 MG PO CP24: 10.0000 mg | ORAL_CAPSULE | Freq: Every day | ORAL | 0 refills | Status: DC

## 2022-07-08 MED ORDER — SIMVASTATIN 40 MG PO TABS: 40.0000 mg | ORAL_TABLET | Freq: Every day | ORAL | 3 refills | Status: DC

## 2022-07-08 MED ORDER — OMEPRAZOLE 20 MG PO CPDR: 20.0000 mg | DELAYED_RELEASE_CAPSULE | Freq: Every day | ORAL | 3 refills | Status: DC

## 2022-07-08 MED ORDER — ACYCLOVIR 200 MG PO CAPS: ORAL_CAPSULE | ORAL | 3 refills | Status: DC

## 2022-07-08 NOTE — Progress Notes (Signed)
   Subjective:    Patient ID: Connor Thomas, male    DOB: [birth year], 62 y.o.   MRN: [id number]  HPI Here for a well exam. He is doing well.    Review of Systems  Constitutional: Negative.   HENT: Negative.    Eyes: Negative.   Respiratory: Negative.    Cardiovascular: Negative.   Gastrointestinal: Negative.   Genitourinary: Negative.   Musculoskeletal: Negative.   Skin: Negative.   Neurological: Negative.   Psychiatric/Behavioral: Negative.         Objective:   Physical Exam Constitutional:      General: He is not in acute distress.    Appearance: Normal appearance. He is well-developed. He is not diaphoretic.  HENT:     Head: Normocephalic and atraumatic.     Right Ear: External ear normal.     Left Ear: External ear normal.     Nose: Nose normal.     Mouth/Throat:     Pharynx: No oropharyngeal exudate.  Eyes:     General: No scleral icterus.       Right eye: No discharge.        Left eye: No discharge.     Conjunctiva/sclera: Conjunctivae normal.     Pupils: Pupils are equal, round, and reactive to light.  Neck:     Thyroid: No thyromegaly.     Vascular: No JVD.     Trachea: No tracheal deviation.  Cardiovascular:     Rate and Rhythm: Normal rate and regular rhythm.     Pulses: Normal pulses.     Heart sounds: Normal heart sounds. No murmur heard.    No friction rub. No gallop.  Pulmonary:     Effort: Pulmonary effort is normal. No respiratory distress.     Breath sounds: Normal breath sounds. No wheezing or rales.  Chest:     Chest wall: No tenderness.  Abdominal:     General: Bowel sounds are normal. There is no distension.     Palpations: Abdomen is soft. There is no mass.     Tenderness: There is no abdominal tenderness. There is no guarding or rebound.  Genitourinary:    Penis: Normal. No tenderness.      Testes: Normal.     Prostate: Normal.     Rectum: Normal. Guaiac result negative.  Musculoskeletal:        General: No tenderness. Normal range  of motion.     Cervical back: Neck supple.  Lymphadenopathy:     Cervical: No cervical adenopathy.  Skin:    General: Skin is warm and dry.     Coloration: Skin is not pale.     Findings: No erythema or rash.  Neurological:     General: No focal deficit present.     Mental Status: He is alert and oriented to person, place, and time.     Cranial Nerves: No cranial nerve deficit.     Motor: No abnormal muscle tone.     Coordination: Coordination normal.     Deep Tendon Reflexes: Reflexes are normal and symmetric. Reflexes normal.  Psychiatric:        Behavior: Behavior normal.        Thought Content: Thought content normal.        Judgment: Judgment normal.           Assessment & Plan:  Well exam. We discussed diet and exercise. Get fasting labs. Gershon Crane, MD

## 2022-07-08 NOTE — Addendum Note (Signed)
Addended by: Carola Rhine on: 07/08/2022 05:18 PM   Modules accepted: Orders

## 2022-07-11 ENCOUNTER — Encounter: Payer: Self-pay | Admitting: Family Medicine

## 2022-07-12 MED ORDER — SIMVASTATIN 80 MG PO TABS: 80.0000 mg | ORAL_TABLET | Freq: Every day | ORAL | 3 refills | Status: DC

## 2022-07-12 NOTE — Telephone Encounter (Signed)
I agree, we can try increasing the Simvastatin to 80 mg daily. Call in #90 with 3 rf and repeat lipids in 90 days

## 2022-07-16 ENCOUNTER — Telehealth: Payer: Self-pay | Admitting: Family Medicine

## 2022-07-16 NOTE — Telephone Encounter (Signed)
Error. Please disregard

## 2022-07-16 NOTE — Telephone Encounter (Signed)
L;eft a message for pt to call the office for his lab results

## 2022-07-16 NOTE — Telephone Encounter (Addendum)
Pt called, returning CMA's call regarding labs. CMA was with a patient. Pt asked that CMA call back at her earliest convenience. 

## 2022-07-19 NOTE — Telephone Encounter (Signed)
No I would not recommend that. Data shows aspirin for someone like him may have more risks than benefits

## 2022-10-28 ENCOUNTER — Other Ambulatory Visit: Payer: Self-pay | Admitting: Family Medicine

## 2022-10-29 MED ORDER — AMPHETAMINE-DEXTROAMPHET ER 10 MG PO CP24: 10.0000 mg | ORAL_CAPSULE | Freq: Every day | ORAL | 0 refills | Status: DC

## 2022-10-29 NOTE — Telephone Encounter (Signed)
Done

## 2022-10-29 NOTE — Telephone Encounter (Signed)
Pt LOV was on 07/08/22 Last refill was done on 07/08/22 Please advise

## 2022-10-30 ENCOUNTER — Encounter: Payer: Self-pay | Admitting: Family Medicine

## 2022-10-30 ENCOUNTER — Other Ambulatory Visit (HOSPITAL_COMMUNITY): Payer: Self-pay

## 2022-10-30 ENCOUNTER — Other Ambulatory Visit: Payer: Self-pay

## 2022-10-30 MED ORDER — AMPHETAMINE-DEXTROAMPHET ER 10 MG PO CP24
10.0000 mg | ORAL_CAPSULE | Freq: Every day | ORAL | 0 refills | Status: DC
  Filled 2022-10-30 – 2022-12-24 (×2): qty 30, 30d supply, fill #0

## 2022-10-30 MED ORDER — AMPHETAMINE-DEXTROAMPHET ER 10 MG PO CP24
10.0000 mg | ORAL_CAPSULE | Freq: Every day | ORAL | 0 refills | Status: DC
  Filled 2022-10-30: qty 30, 30d supply, fill #0

## 2022-10-30 MED ORDER — AMPHETAMINE-DEXTROAMPHET ER 10 MG PO CP24
10.0000 mg | ORAL_CAPSULE | Freq: Every day | ORAL | 0 refills | Status: DC
  Filled 2022-10-30 – 2023-02-10 (×2): qty 30, 30d supply, fill #0

## 2022-10-30 NOTE — Telephone Encounter (Signed)
I sent them to [facility]

## 2022-11-01 ENCOUNTER — Other Ambulatory Visit: Payer: Self-pay | Admitting: Family Medicine

## 2022-11-01 ENCOUNTER — Other Ambulatory Visit (HOSPITAL_COMMUNITY): Payer: Self-pay

## 2022-11-01 DIAGNOSIS — J302 Other seasonal allergic rhinitis: Secondary | ICD-10-CM

## 2022-11-01 MED ORDER — FLUTICASONE PROPIONATE 50 MCG/ACT NA SUSP
NASAL | 0 refills | Status: DC
  Filled 2022-11-01: qty 16, 30d supply, fill #0

## 2022-11-05 ENCOUNTER — Other Ambulatory Visit (HOSPITAL_COMMUNITY): Payer: Self-pay

## 2022-11-15 ENCOUNTER — Encounter: Payer: Self-pay | Admitting: Family Medicine

## 2022-11-15 DIAGNOSIS — E66811 Obesity, class 1: Secondary | ICD-10-CM

## 2022-11-18 DIAGNOSIS — E66811 Obesity, class 1: Secondary | ICD-10-CM | POA: Insufficient documentation

## 2022-11-18 NOTE — Telephone Encounter (Signed)
I think he would be a good candidate to try [facility], but I doubt his insurance would cover it. It likely would be covered however if he was seeing the [facility] clinic and they ordered this for him. Therefore I am referring him to the Weight Management clinic

## 2022-11-19 NOTE — Telephone Encounter (Signed)
Pt notified via My Chart

## 2022-12-03 ENCOUNTER — Encounter: Payer: Self-pay | Admitting: Family Medicine

## 2022-12-03 DIAGNOSIS — Z0289 Encounter for other administrative examinations: Secondary | ICD-10-CM

## 2022-12-24 ENCOUNTER — Other Ambulatory Visit (HOSPITAL_COMMUNITY): Payer: Self-pay

## 2022-12-27 ENCOUNTER — Other Ambulatory Visit (HOSPITAL_COMMUNITY): Payer: Self-pay

## 2022-12-31 ENCOUNTER — Encounter (INDEPENDENT_AMBULATORY_CARE_PROVIDER_SITE_OTHER): Payer: Self-pay | Admitting: Family Medicine

## 2022-12-31 ENCOUNTER — Ambulatory Visit (INDEPENDENT_AMBULATORY_CARE_PROVIDER_SITE_OTHER): Payer: 59 | Admitting: Family Medicine

## 2022-12-31 VITALS — BP 114/75 | HR 64 | Temp 98.0°F | Ht 67.0 in | Wt 210.0 lb

## 2022-12-31 DIAGNOSIS — F5089 Other specified eating disorder: Secondary | ICD-10-CM | POA: Diagnosis not present

## 2022-12-31 DIAGNOSIS — R7303 Prediabetes: Secondary | ICD-10-CM

## 2022-12-31 DIAGNOSIS — E669 Obesity, unspecified: Secondary | ICD-10-CM

## 2022-12-31 DIAGNOSIS — Z1331 Encounter for screening for depression: Secondary | ICD-10-CM | POA: Diagnosis not present

## 2022-12-31 DIAGNOSIS — Z6832 Body mass index (BMI) 32.0-32.9, adult: Secondary | ICD-10-CM

## 2022-12-31 DIAGNOSIS — R0602 Shortness of breath: Secondary | ICD-10-CM | POA: Diagnosis not present

## 2022-12-31 DIAGNOSIS — F39 Unspecified mood [affective] disorder: Secondary | ICD-10-CM | POA: Diagnosis not present

## 2022-12-31 DIAGNOSIS — E785 Hyperlipidemia, unspecified: Secondary | ICD-10-CM

## 2022-12-31 DIAGNOSIS — E66811 Obesity, class 1: Secondary | ICD-10-CM

## 2022-12-31 DIAGNOSIS — R5383 Other fatigue: Secondary | ICD-10-CM | POA: Diagnosis not present

## 2022-12-31 NOTE — Progress Notes (Incomplete)
Connor Thomas, D.O.  ABFM, ABOM Specializing in Clinical Bariatric Medicine Office located at: [address]  [place], [place]  [address]   Bariatric Medicine Visit  Dear Nelwyn Salisbury, MD   Thank you for referring Connor Thomas to our clinic today for evaluation.  We performed a consultation to discuss his options for treatment and educate the patient on his disease state.  The following note includes my evaluation and treatment recommendations.   Please do not hesitate to reach out to me directly if you have any further concerns.  Assessment and Plan:   FOR THE DISEASE OF OBESITY: BMI 32.0-32.9,adult Obesity (BMI 30.0-34.9) -starting bmi 12/31/22 32.88 Assessment & Plan:  Recommended Dietary Goals Connor Thomas is currently in the action stage of change. As such, his goal is to continue weight management plan.  He has agreed to: journal 1550-1650 calories and 110++ grams of protein  daily with the Category 3 MP with 100 snack calories as a guide.    Behavioral Intervention We discussed the following Behavioral Modification Strategies today: begin to work on maintaining a reduced calorie state, getting the recommended amount of protein, making healthy choices, staying well hydrated, and  working on journaling/tracking intake through application.   Additional resources provided today: handout on CAT 3 meal plan  Evidence-based interventions for health behavior change were utilized today including the discussion of self monitoring techniques, problem-solving barriers and SMART goal setting techniques.   Regarding patient's less desirable eating habits and patterns, we employed the technique of small changes.   Pt will specifically work on: n/a   Recommended Physical Activity Goals [name] has been advised to work up to 150 minutes of moderate intensity aerobic activity a week and strengthening exercises 2-3 times per week for cardiovascular health, weight loss maintenance and  preservation of muscle mass.   He has agreed to : Continue current level of physical activity    Pharmacotherapy We both agreed to : continue with nutritional and behavioral strategies   FOR ASSOCIATED CONDITIONS ADDRESSED TODAY:  Fatigue Assessment & Plan: Connor Thomas does feel that his weight is causing his energy to be lower than it should be. Fatigue may be related to obesity, depression or many other causes. he does not appear to have any red flag symptoms and this appears to most likely be related to his current lifestyle habits and dietary intake.   Labs will be ordered and reviewed with him at their next office visit in two weeks.  Epworth sleepiness scale score appears to be within normal limits.  His ESS score is 7. Bransyn admits to occasional daytime somnolence and  sometimes  wakes up still tired. Mackinley generally gets 8 hours of sleep per night, and states that he has generally restful sleep. Snoring is sometimes present. Apneic episodes are not present.   ECG: Performed and reviewed/ interpreted independently.  Normal sinus rhythm, rate 62 bpm; reassuring without any acute abnormalities, will continue to monitor for symptoms   Orders: -     CBC with Differential/Platelet -     VITAMIN D 25 Hydroxy (Vit-D Deficiency, Fractures) -     Comprehensive metabolic panel -     Folate -     Hemoglobin A1c -     Insulin, random -     Lipid Panel With LDL/HDL Ratio -     T4, free -     TSH -     Vitamin B12 -     EKG 12-Lead  Shortness of breath on exertion Assessment & Plan: [name] does feel that he gets out of breath more easily than he used to when he exercises and seems to be worsening over time with weight gain.  This has gotten worse recently. Connor Thomas denies shortness of breath at rest or orthopnea. Connor Thomas's shortness of breath appears to be obesity related and exercise induced, as they do not appear to have any "red flag" symptoms/ concerns today.  Also, this condition appears to  be related to a state of poor cardiovascular conditioning   Obtain labs today and will be reviewed with him at their next office visit in two weeks.  Indirect Calorimeter completed today to help guide our dietary regimen. It shows a VO2 of 285 and a REE of 1973.  His calculated basal metabolic rate is 3875 thus his measured basal metabolic rate is worse than expected.  Patient agreed to work on weight loss at this time.  As Connor Thomas progresses through our weight loss program, we will gradually increase exercise as tolerated to treat his current condition.   If Connor Thomas follows our recommendations and loses 5-10% of their weight without improvement of his shortness of breath or if at any time, symptoms become more concerning, they agree to urgently follow up with their PCP/ specialist for further consideration/ evaluation.   Colen verbalizes agreement with this plan.    Depression screen Assessment & Plan: His Food and Mood (modified PHQ-9) score was positive at 13. He denies SI/HI.   We will monitor this closely and work on CBT to help improve any non-hunger eating patterns. Referral to bariatric psychology may be warranted in the future.    Prediabetes Assessment & Plan: Most recent Hemoglobin A1c:  Lab Results  Component Value Date   HGBA1C 5.7 07/08/2022   HGBA1C 5.4 12/14/2019   HGBA1C 5.8 11/23/2018    Pt endorses having a pertinent family hx of diabetes. He was initially diagnosed with prediabetes roughly 12-13 yrs ago. Has never been on a prediabetic medication. Pt will begin to decrease simple carbs/ sugars; increase fiber and proteins -> follow his meal plan.     Hyperlipidemia, unspecified hyperlipidemia type Assessment & Plan: Most recent Lipid panel: Lab Results  Component Value Date   CHOL 171 07/08/2022   HDL 41.90 07/08/2022   LDLCALC 111 (H) 07/08/2022   LDLDIRECT 144.9 03/10/2008   TRIG 92.0 07/08/2022   CHOLHDL 4 07/08/2022   Pt has been on Zocor 80 mg daily for  the past 20 yrs. Pt declines having a hx of heart attack or stroke. Pt will continue with Zocor per PCP and begin  our treatment plan of a heart-heathy, low cholesterol meal plan. Advance exercise as tolerated in the future.    Mood disorder (HCC) - emotional eating Assessment & Plan: Pt on Adderal 10 mg daily for ADD - started medication at 62 y.o. Additionally, he has been on Celexa for several years due to a hx of anxiety, stress, and depression. He feels that his mood is currently well-controlled. He sees his counselor once in a while. Pt sometimes tends to eat when stressed, sad, bored, guilty, angry, as a reward, and to help comfort himself.   Discussed the potential benefits of seeing one's counselor more regularly, even when one does not feel that they have a pressing issue at the moment. Pt informed about our bariatric psychologist. Pt wishes to hold off on referral to Dr.Barker today. Reminded patient of the importance of following their prudent nutrition  plan and how food can affect mood as well to support emotional wellbeing. Continue with all meds.    FOLLOW UP:   Follow up in 2 weeks. He was informed of the importance of frequent follow up visits to maximize his success with intensive lifestyle modifications for his multiple health conditions.  ENOC ESTORGA is aware that we will review all of his lab results at our next visit.  He is aware that if anything is critical/ life threatening with the results, we will be contacting him via MyChart prior to the office visit to discuss management.    Chief Complaint:   OBESITY Connor Thomas (MR# [id number]) is a pleasant 62 y.o. male who presents for evaluation and treatment of obesity and related comorbidities. Current BMI is Body mass index is 32.89 kg/m. LATHEN CUCCO has been struggling with his weight for many years and has been unsuccessful in either losing weight, maintaining weight loss, or reaching his healthy weight goal.  MATTHER DOUPE is currently in the action stage of change and ready to dedicate time achieving and maintaining a healthier weight. DEMETRIC NORTHRIP is interested in becoming our patient and working on intensive lifestyle modifications including (but not limited to) diet and exercise for weight loss.  EJ KITTNER works in [employer], 40-50 hrs a week. Patient is married to Phineas Semen  and lives together with her.   Desires to be 165 lbs in 6 months or so.   Desires to walks or bike 1-2 days, 60-120 minutes per week.   Has tried Weight Watchers (lost 20 lbs, did not keep off), Keto (lost 30 lbs, kept off for 1 year), and Optavia (lost 64 lbs, kept off for 1 year). He enjoyed Optavia the most.   Eats outside the home 1-2 times a week.  Craves ice-cream, sweets, chocolate, cheese, and nuts.  Snacks on nuts.  Dislikes pasta and rice.   Worst food habit: "[place] food once a week"  Occasionally has almond milk.  Subjective:   This is the patient's first visit at [facility].  The patient's NEW PATIENT PACKET that they filled out prior to today's office visit was reviewed at length and information from that paperwork was included within the following office visit note.    Included in the packet: current and past health history, medications, allergies, ROS, gynecologic history (women only), surgical history, family history, social history, weight history, weight loss surgery history (for those that have had weight loss surgery), nutritional evaluation, mood and food questionnaire along with a depression screening (PHQ9) on all patients, an Epworth questionnaire, sleep habits questionnaire, patient life and health improvement goals questionnaire. These will all be scanned into the patient's chart under the "media" tab.   Review of Systems: Please refer to new patient packet scanned into media. Pertinent positives were addressed with patient today.  Reviewed by clinician on day of visit: allergies,  medications, problem list, medical history, surgical history, family history, social history, and previous encounter notes.  During the visit, I independently reviewed the patient's EKG, bioimpedance scale results, and indirect calorimeter results. I used this information to tailor a meal plan for the patient that will help Revonda Humphrey to lose weight and will improve his obesity-related conditions going forward.  I performed a medically necessary appropriate examination and/or evaluation. I discussed the assessment and treatment plan with the patient. The patient was provided an opportunity to ask questions and all were answered. The patient  agreed with the plan and demonstrated an understanding of the instructions. Labs were ordered today (unless patient declined them) and will be reviewed with the patient at our next visit unless more critical results need to be addressed immediately. Clinical information was updated and documented in the EMR.   Objective:   PHYSICAL EXAM: Blood pressure 114/75, pulse 64, temperature 98 F (36.7 C), height 5\' 7"  (1.702 m), weight 210 lb (95.3 kg), SpO2 99%. Body mass index is 32.89 kg/m.  General: Well Developed, well nourished, and in no acute distress.  HEENT: Normocephalic, atraumatic; EOMI, sclerae are anicteric. Skin: Warm and dry, good turgor Chest:  Normal excursion, shape, no gross ABN Respiratory: No conversational dyspnea; speaking in full sentences NeuroM-Sk:  Normal gross ROM * 4 extremities  Psych: A and O *3, insight adequate, mood- full   Anthropometric Measurements Height: 5\' 7"  (1.702 m) Weight: 210 lb (95.3 kg) BMI (Calculated): 32.88 Weight at Last Visit: na Weight Lost Since Last Visit: na Weight Gained Since Last Visit: na Starting Weight: 210lb Total Weight Loss (lbs): 0 lb (0 kg) Peak Weight: 264lb Waist Measurement : 45 inches   Body Composition  Body Fat %: 27.8 % Fat Mass (lbs): 58.4 lbs Muscle Mass (lbs): 144.4  lbs Total Body Water (lbs): 100 lbs Visceral Fat Rating : 16   Other Clinical Data RMR: 1973 Fasting: yes Labs: yes Today's Visit #: 1 Starting Date: 12/31/22 Comments: first visit   DIAGNOSTIC DATA REVIEWED:  BMET    Component Value Date/Time   NA 138 07/08/2022 0838   K 4.4 07/08/2022 0838   CL 103 07/08/2022 0838   CO2 28 07/08/2022 0838   GLUCOSE 106 (H) 07/08/2022 0838   GLUCOSE 93 01/27/2006 0917   BUN 14 07/08/2022 0838   CREATININE 0.89 07/08/2022 0838   CREATININE 0.91 12/14/2019 1010   CALCIUM 9.2 07/08/2022 0838   GFRNONAA 92 12/14/2019 1010   GFRAA 107 12/14/2019 1010   Lab Results  Component Value Date   HGBA1C 5.7 07/08/2022   HGBA1C 5.7 10/17/2014   No results found for: "INSULIN" Lab Results  Component Value Date   TSH 1.73 07/08/2022   CBC    Component Value Date/Time   WBC 6.6 07/08/2022 0838   RBC 4.60 07/08/2022 0838   HGB 13.8 07/08/2022 0838   HCT 40.4 07/08/2022 0838   PLT 301.0 07/08/2022 0838   MCV 87.9 07/08/2022 0838   MCH 30.7 12/14/2019 1010   MCHC 34.2 07/08/2022 0838   RDW 13.4 07/08/2022 0838   Iron Studies No results found for: "IRON", "TIBC", "FERRITIN", "IRONPCTSAT" Lipid Panel     Component Value Date/Time   CHOL 171 07/08/2022 0838   TRIG 92.0 07/08/2022 0838   TRIG 119 01/27/2006 0917   HDL 41.90 07/08/2022 0838   CHOLHDL 4 07/08/2022 0838   VLDL 18.4 07/08/2022 0838   LDLCALC 111 (H) 07/08/2022 0838   LDLCALC 103 (H) 12/14/2019 1010   LDLDIRECT 144.9 03/10/2008 0812   Hepatic Function Panel     Component Value Date/Time   PROT 6.9 07/08/2022 0838   ALBUMIN 4.4 07/08/2022 0838   AST 15 07/08/2022 0838   ALT 19 07/08/2022 0838   ALKPHOS 61 07/08/2022 0838   BILITOT 0.4 07/08/2022 0838   BILIDIR 0.1 07/08/2022 0838   IBILI 0.6 12/14/2019 1010      Component Value Date/Time   TSH 1.73 07/08/2022 0838   Nutritional No results found for: "VD25OH"  Attestation Statements:   I, Special Randolm Idol,  acting as a Stage manager for Thomasene Lot, DO., have compiled all relevant documentation for today's office visit on behalf of Thomasene Lot, DO, while in the presence of [employer], DO.  Reviewed by clinician on day of visit: allergies, medications, problem list, medical history, surgical history, family history, social history, and previous encounter notes pertinent to patient's obesity diagnosis. I have spent 56 minutes in the care of the patient today including: preparing to see patient (e.g. review and interpretation of tests, old notes ), obtaining and/or reviewing separately obtained history, performing a medically appropriate examination or evaluation, counseling and educating the patient, ordering medications, test or procedures, documenting clinical information in the electronic or other health care record, and independently interpreting results and communicating results to the patient, family, or caregiver   I have reviewed the above documentation for accuracy and completeness, and I agree with the above. Connor Thomas, D.O.  The 21st Century Cures Act was signed into law in 2016 which includes the topic of electronic health records.  This provides immediate access to information in MyChart.  This includes consultation notes, operative notes, office notes, lab results and pathology reports.  If you have any questions about what you read please let us know at your next visit so we can discuss your concerns and take corrective action if need be.  We are right here with you.

## 2023-01-01 LAB — INSULIN, RANDOM: INSULIN: 7.8 u[IU]/mL (ref 2.6–24.9)

## 2023-01-01 LAB — COMPREHENSIVE METABOLIC PANEL
ALT: 29 [IU]/L (ref 0–44)
AST: 22 [IU]/L (ref 0–40)
Albumin: 4.7 g/dL (ref 3.9–4.9)
Alkaline Phosphatase: 76 [IU]/L (ref 44–121)
BUN/Creatinine Ratio: 12 (ref 10–24)
BUN: 12 mg/dL (ref 8–27)
Bilirubin Total: 0.3 mg/dL (ref 0.0–1.2)
CO2: 26 mmol/L (ref 20–29)
Calcium: 9.3 mg/dL (ref 8.6–10.2)
Chloride: 103 mmol/L (ref 96–106)
Creatinine, Ser: 1.01 mg/dL (ref 0.76–1.27)
Globulin, Total: 2.4 g/dL (ref 1.5–4.5)
Glucose: 101 mg/dL — ABNORMAL HIGH (ref 70–99)
Potassium: 4.9 mmol/L (ref 3.5–5.2)
Sodium: 142 mmol/L (ref 134–144)
Total Protein: 7.1 g/dL (ref 6.0–8.5)
eGFR: 84 mL/min/{1.73_m2} (ref >59)

## 2023-01-01 LAB — CBC WITH DIFFERENTIAL/PLATELET
Basophils Absolute: 0.1 10*3/uL (ref 0.0–0.2)
Basos: 1 %
EOS (ABSOLUTE): 0.2 10*3/uL (ref 0.0–0.4)
Eos: 3 %
Hematocrit: 42.9 % (ref 37.5–51.0)
Hemoglobin: 14.2 g/dL (ref 13.0–17.7)
Immature Grans (Abs): 0 10*3/uL (ref 0.0–0.1)
Immature Granulocytes: 0 %
Lymphocytes Absolute: 2.6 10*3/uL (ref 0.7–3.1)
Lymphs: 37 %
MCH: 29.9 pg (ref 26.6–33.0)
MCHC: 33.1 g/dL (ref 31.5–35.7)
MCV: 90 fL (ref 79–97)
Monocytes Absolute: 0.6 10*3/uL (ref 0.1–0.9)
Monocytes: 8 %
Neutrophils Absolute: 3.5 10*3/uL (ref 1.4–7.0)
Neutrophils: 51 %
Platelets: 320 10*3/uL (ref 150–450)
RBC: 4.75 x10E6/uL (ref 4.14–5.80)
RDW: 12.4 % (ref 11.6–15.4)
WBC: 7 10*3/uL (ref 3.4–10.8)

## 2023-01-01 LAB — HEMOGLOBIN A1C
Est. average glucose Bld gHb Est-mCnc: 120 mg/dL
Hgb A1c MFr Bld: 5.8 % — ABNORMAL HIGH (ref 4.8–5.6)

## 2023-01-01 LAB — LIPID PANEL WITH LDL/HDL RATIO
Cholesterol, Total: 171 mg/dL (ref 100–199)
HDL: 49 mg/dL (ref >39)
LDL Chol Calc (NIH): 106 mg/dL — ABNORMAL HIGH (ref 0–99)
LDL/HDL Ratio: 2.2 {ratio} (ref 0.0–3.6)
Triglycerides: 83 mg/dL (ref 0–149)
VLDL Cholesterol Cal: 16 mg/dL (ref 5–40)

## 2023-01-01 LAB — VITAMIN D 25 HYDROXY (VIT D DEFICIENCY, FRACTURES): Vit D, 25-Hydroxy: 39.7 ng/mL (ref 30.0–100.0)

## 2023-01-01 LAB — FOLATE: Folate: 9.1 ng/mL (ref >3.0)

## 2023-01-01 LAB — T4, FREE: Free T4: 1.24 ng/dL (ref 0.82–1.77)

## 2023-01-01 LAB — TSH: TSH: 1.67 u[IU]/mL (ref 0.450–4.500)

## 2023-01-01 LAB — VITAMIN B12: Vitamin B-12: 495 pg/mL (ref 232–1245)

## 2023-01-28 ENCOUNTER — Ambulatory Visit (INDEPENDENT_AMBULATORY_CARE_PROVIDER_SITE_OTHER): Payer: Self-pay | Admitting: Family Medicine

## 2023-02-10 ENCOUNTER — Other Ambulatory Visit (HOSPITAL_COMMUNITY): Payer: Self-pay

## 2023-02-10 ENCOUNTER — Other Ambulatory Visit: Payer: Self-pay

## 2023-02-11 ENCOUNTER — Other Ambulatory Visit (HOSPITAL_COMMUNITY): Payer: Self-pay

## 2023-02-17 ENCOUNTER — Ambulatory Visit (INDEPENDENT_AMBULATORY_CARE_PROVIDER_SITE_OTHER): Payer: Self-pay | Admitting: Family Medicine

## 2023-02-24 ENCOUNTER — Ambulatory Visit (INDEPENDENT_AMBULATORY_CARE_PROVIDER_SITE_OTHER): Payer: 59 | Admitting: Family Medicine

## 2023-02-24 ENCOUNTER — Encounter (INDEPENDENT_AMBULATORY_CARE_PROVIDER_SITE_OTHER): Payer: Self-pay | Admitting: Family Medicine

## 2023-02-24 VITALS — BP 128/83 | HR 84 | Temp 99.3°F | Ht 67.0 in | Wt 211.0 lb

## 2023-02-24 DIAGNOSIS — E66811 Obesity, class 1: Secondary | ICD-10-CM

## 2023-02-24 DIAGNOSIS — R7303 Prediabetes: Secondary | ICD-10-CM

## 2023-02-24 DIAGNOSIS — E559 Vitamin D deficiency, unspecified: Secondary | ICD-10-CM | POA: Diagnosis not present

## 2023-02-24 DIAGNOSIS — E785 Hyperlipidemia, unspecified: Secondary | ICD-10-CM

## 2023-02-24 DIAGNOSIS — E669 Obesity, unspecified: Secondary | ICD-10-CM

## 2023-02-24 DIAGNOSIS — F5089 Other specified eating disorder: Secondary | ICD-10-CM | POA: Diagnosis not present

## 2023-02-24 DIAGNOSIS — Z6832 Body mass index (BMI) 32.0-32.9, adult: Secondary | ICD-10-CM

## 2023-02-24 DIAGNOSIS — F39 Unspecified mood [affective] disorder: Secondary | ICD-10-CM

## 2023-02-24 MED ORDER — VITAMIN D3 50 MCG (2000 UT) PO CAPS: ORAL_CAPSULE | ORAL | Status: DC

## 2023-02-24 NOTE — Progress Notes (Incomplete)
Connor Thomas, D.O.  ABFM, ABOM Clinical Bariatric Medicine Physician  Office located at: [address] [place], [place]  [address]     Assessment and Plan:   FOR THE DISEASE OF OBESITY: BMI 32.0-32.9,adult Obesity (BMI 30.0-34.9) -starting bmi 12/31/22 32.88 Assessment & Plan: Connor Thomas's initial office visit was on 12/31/22.  Since last office visit on 12/31/22 patient's muscle mass has decreased by 1lb. Fat mass has increased by 1.8lb. Total body water has increased by 1lb.  Counseling done on how various foods will affect these numbers and how to maximize success  Total lbs lost to date: -1 lb  (gained 1 lb) Total weight loss percentage to date: 0.48 %  Recommended Dietary Goals Connor Thomas is currently in the action stage of change. As such, his goal is to continue weight management plan. He has agreed to:  Get back on track with Category 3 meal plan with B?L options and 100 optional snack calories and journaling 1550-1650 calories and 110++ grams of protein daily.    Behavioral Intervention We discussed the following Behavioral Modification Strategies today: increasing lean protein intake to established goals, decreasing simple carbohydrates , avoiding skipping meals, increasing water intake , work on tracking and journaling calories using tracking application, and continue to work on implementation of reduced calorie nutritional plan  Additional resources provided today: Handout and personalized instruction on tracking and journaling using Apps or handwriting and using logs provided, Handout on hyperinsulinemia and health risks, Handout on CAT 3-4 breakfast options, Handout on CAT 3-4 lunch options, and Handout on insulin resistance and  PreDM.   Evidence-based interventions for health behavior change were utilized today including the discussion of self monitoring techniques, problem-solving barriers and SMART goal setting techniques.   Regarding patient's less  desirable eating habits and patterns, we employed the technique of small changes.   Pt will specifically work on: Get back on track with meal plan for next visit.   Recommended Physical Activity Goals Connor Thomas has been advised to work up to 150 minutes of moderate intensity aerobic activity a week and strengthening exercises 2-3 times per week for cardiovascular health, weight loss maintenance and preservation of muscle mass.   He has agreed to :  {EMEXERCISE:28847::"Think about enjoyable ways to increase daily physical activity and overcoming barriers to exercise","Increase physical activity in their day and reduce sedentary time (increase NEAT)."}   Pharmacotherapy We discussed various medication options to help Connor Thomas with his weight loss efforts and we both agreed to : {EMagreedrx:29170}   FOR ASSOCIATED CONDITIONS ADDRESSED TODAY: Prediabetes Assessment & Plan: Lab Results  Component Value Date   HGBA1C 5.8 (H) 12/31/2022   HGBA1C 5.7 07/08/2022   HGBA1C 5.4 12/14/2019   INSULIN 7.8 12/31/2022    *** Pt last A1C was above goal at 5.8 on 12/31/22.  Worse than prior 5.7 to 5.8   - Want him to eat meal plan  - Limit unhealthy foods and  - Eating lean protein to better stabilize sugars and - Will recheck in another 3 months.  - Education pt on insulin resistance and prediabetes   Goal insulin should be at 5  -losing weight, eating right foods    Hyperlipidemia, unspecified hyperlipidemia type Assessment & Plan: Lab Results  Component Value Date   CHOL 171 12/31/2022   HDL 49 12/31/2022   LDLCALC 106 (H) 12/31/2022   LDLDIRECT 144.9 03/10/2008   TRIG 83 12/31/2022   CHOLHDL 4 07/08/2022   ***  Last LDL  was 106 as of 12/31/22. Prior labs on 07/08/22 showed LDL of 111 and about 5 years ago LDL was 171. Pt is on simvastatin 80 mg once daily. Has been on this current dose for months now. Denies any fmhx of early CAD.   Recommended pt to avoid sat/trans fats (fatty meats,  butters, etc.).  - Visit American Heart Association     Mood disorder (HCC) - emotional eating Assessment & Plan: *** Pt has hx of ADD    B12 Deficiency:  Lab Results  Component Value Date   VITAMINB12 495 12/31/2022   Last B12 was close to goal at 495 as of 1210/24. Not currently taking B12 supplements and is unsure if he was taking a multivitamin in October/November of 2024. Prior B12 was 292 in 03/2021.   Reviewed B12 goal of 500 and above, pt verbalized understanding. He is agreeable to ***.    Vitamin D deficiency Assessment & Plan: Lab Results  Component Value Date   VD25OH 39.7 12/31/2022   Last vitamin D was sub-optimal at 39.7 on 12/31/22. Not currently taking any supplements.   Reviewed ideal vitamin D goal of 50-70, pt verbalized understanding. I recommend he start taking 2000 IUs of OTC vitamin D3 daily, pt agreed.  Will recheck his vitamin D in 3-4 months to monitor his condition and effects of supplementation.   Orders: - Start Vitamin D3 2000 IUs once daily    FOLLOW UP:   Return in about 4 weeks (around 03/24/2023).*** He was informed of the importance of frequent follow up visits to maximize his success with intensive lifestyle modifications for his multiple health conditions.   Subjective:   Chief complaint: Obesity Connor Thomas is here to discuss his progress with his obesity treatment plan. He is on the Category 3 Plan as guide and keeping a food journal and adhering to recommended goals of 1550-1650 calories and 110+ g of protein. He reports he is following his eating plan approximately 50 % of the time. However, he was not aware of items on his meal plan or what his meal plan was. He states he is not exercising.   Interval History:  Connor Thomas is here today for his first follow-up office visit since starting the program with [place].  Since last office visit he is up 1 lb. He has been off plan due to getting sick with "the crud" and has taken a long time to  recover from. States the first week he was following his meal plan went well. Reports he enjoys eating salad with 4 tbsp of dressings.   States exercise has been difficult due to a hx of back injury from his mid 92s. He recently got an MRI and followed up with neurology. 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 Per pt, his neurologist recommended he lose weight and "get flexible" to help reduce his back pain.   All blood work/ lab tests that were recently ordered by myself or an outside provider were reviewed with patient today per their request. Extended time was spent counseling him on all new disease processes that were discovered or preexisting ones that are affected by BMI.  he understands that many of these abnormalities will need to monitored regularly along with the current treatment plan of prudent dietary changes, in which we are making each and every office visit, to improve these health parameters.  Pharmacotherapy for weight loss: He is not currently taking medications  for medical weight loss.  Denies side  effects.    Review of Systems:  Pertinent positives were addressed with patient today.  Reviewed by clinician on day of visit: allergies, medications, problem list, medical history, surgical history, family history, social history, and previous encounter notes.   Weight Summary and Biometrics   Weight Lost Since Last Visit: 0  Weight Gained Since Last Visit: 1lb   Vitals Temp: 99.3 F (37.4 C) BP: 128/83 Pulse Rate: 84 SpO2: 97 %   Anthropometric Measurements Height: 5\' 7"  (1.702 m) Weight: 211 lb (95.7 kg) BMI (Calculated): 33.04 Weight at Last Visit: 210lb Weight Lost Since Last Visit: 0 Weight Gained Since Last Visit: 1lb Starting Weight: 210lb Total Weight Loss (lbs): 0 lb (0 kg) Peak Weight: 264lb   Body Composition  Body Fat %: 28.5 % Fat Mass (lbs): 60.2 lbs Muscle Mass (lbs): 143.4 lbs Total Body Water (lbs): 101 lbs Visceral Fat Rating :  17   Other Clinical Data Fasting: no Labs: no Today's Visit #: 2 Starting Date: 12/31/22     Objective:   PHYSICAL EXAM:  Blood pressure 128/83, pulse 84, temperature 99.3 F (37.4 C), height 5\' 7"  (1.702 m), weight 211 lb (95.7 kg), SpO2 97%. Body mass index is 33.05 kg/m.  General: he is overweight, cooperative and in no acute distress.   HEENT: EOMI, sclerae are anicteric. Lungs: Normal breathing effort, no conversational dyspnea. M-Sk:  Normal gross ROM * 4 extremities  PSYCH: Has normal mood, affect and thought process. Neurologic: No gross sensory or motor deficits. Well developed, A and O * 3  DIAGNOSTIC DATA REVIEWED:  BMET    Component Value Date/Time   NA 142 12/31/2022 0936   K 4.9 12/31/2022 0936   CL 103 12/31/2022 0936   CO2 26 12/31/2022 0936   GLUCOSE 101 (H) 12/31/2022 0936   GLUCOSE 106 (H) 07/08/2022 0838   GLUCOSE 93 01/27/2006 0917   BUN 12 12/31/2022 0936   CREATININE 1.01 12/31/2022 0936   CREATININE 0.91 12/14/2019 1010   CALCIUM 9.3 12/31/2022 0936   GFRNONAA 92 12/14/2019 1010   GFRAA 107 12/14/2019 1010   Lab Results  Component Value Date   HGBA1C 5.8 (H) 12/31/2022   HGBA1C 5.7 10/17/2014   Lab Results  Component Value Date   INSULIN 7.8 12/31/2022   Lab Results  Component Value Date   TSH 1.670 12/31/2022   CBC    Component Value Date/Time   WBC 7.0 12/31/2022 0936   WBC 6.6 07/08/2022 0838   RBC 4.75 12/31/2022 0936   RBC 4.60 07/08/2022 0838   HGB 14.2 12/31/2022 0936   HCT 42.9 12/31/2022 0936   PLT 320 12/31/2022 0936   MCV 90 12/31/2022 0936   MCH 29.9 12/31/2022 0936   MCH 30.7 12/14/2019 1010   MCHC 33.1 12/31/2022 0936   MCHC 34.2 07/08/2022 0838   RDW 12.4 12/31/2022 0936   Iron Studies No results found for: "IRON", "TIBC", "FERRITIN", "IRONPCTSAT" Lipid Panel     Component Value Date/Time   CHOL 171 12/31/2022 0936   TRIG 83 12/31/2022 0936   TRIG 119 01/27/2006 0917   HDL 49 12/31/2022 0936    CHOLHDL 4 07/08/2022 0838   VLDL 18.4 07/08/2022 0838   LDLCALC 106 (H) 12/31/2022 0936   LDLCALC 103 (H) 12/14/2019 1010   LDLDIRECT 144.9 03/10/2008 0812   Hepatic Function Panel     Component Value Date/Time   PROT 7.1 12/31/2022 0936   ALBUMIN 4.7 12/31/2022 0936   AST 22 12/31/2022 0936  ALT 29 12/31/2022 0936   ALKPHOS 76 12/31/2022 0936   BILITOT 0.3 12/31/2022 0936   BILIDIR 0.1 07/08/2022 0838   IBILI 0.6 12/14/2019 1010      Component Value Date/Time   TSH 1.670 12/31/2022 0936   Nutritional Lab Results  Component Value Date   VD25OH 39.7 12/31/2022    Attestations:   Reviewed by clinician on day of visit: allergies, medications, problem list, medical history, surgical history, family history, social history, and previous encounter notes pertinent to patient's obesity diagnosis.   I have spent 56 minutes in the care of the patient today including: preparing to see patient (e.g. review and interpretation of tests, old notes ), obtaining and/or reviewing separately obtained history, performing a medically appropriate examination or evaluation, counseling and educating the patient, ordering medications, test or procedures, documenting clinical information in the electronic or other health care record, and independently interpreting results and communicating results to the patient, family, or caregiver   I, Isabelle Course, acting as a medical scribe for Thomasene Lot, DO., have compiled all relevant documentation for today's office visit on behalf of Thomasene Lot, DO, while in the presence of [employer], DO.  I have reviewed the above documentation for accuracy and completeness, and I agree with the above. Connor Thomas, D.O.  The 21st Century Cures Act was signed into law in 2016 which includes the topic of electronic health records.  This provides immediate access to information in MyChart.  This includes consultation notes, operative notes, office notes,  lab results and pathology reports.  If you have any questions about what you read please let us know at your next visit so we can discuss your concerns and take corrective action if need be.  We are right here with you.

## 2023-03-04 ENCOUNTER — Ambulatory Visit (INDEPENDENT_AMBULATORY_CARE_PROVIDER_SITE_OTHER): Payer: 59 | Admitting: Family Medicine

## 2023-03-06 ENCOUNTER — Encounter (INDEPENDENT_AMBULATORY_CARE_PROVIDER_SITE_OTHER): Payer: Self-pay | Admitting: Family Medicine

## 2023-03-07 ENCOUNTER — Encounter: Payer: Self-pay | Admitting: Family Medicine

## 2023-03-07 ENCOUNTER — Telehealth: Payer: 59 | Admitting: Family Medicine

## 2023-03-07 ENCOUNTER — Other Ambulatory Visit (HOSPITAL_COMMUNITY): Payer: Self-pay

## 2023-03-07 VITALS — Temp 99.1°F | Ht 67.0 in

## 2023-03-07 DIAGNOSIS — R300 Dysuria: Secondary | ICD-10-CM

## 2023-03-07 DIAGNOSIS — J988 Other specified respiratory disorders: Secondary | ICD-10-CM

## 2023-03-07 MED ORDER — DOXYCYCLINE HYCLATE 100 MG PO TABS
100.0000 mg | ORAL_TABLET | Freq: Two times a day (BID) | ORAL | 0 refills | Status: AC
  Filled 2023-03-07: qty 14, 7d supply, fill #0

## 2023-03-07 NOTE — Progress Notes (Signed)
 Virtual Visit via Video Note I connected with Connor Thomas on 03/07/2023 by a video enabled telemedicine application and verified that I am speaking with the correct person using two identifiers. Location patient: home Location provider:work office Persons participating in the virtual visit: patient, provider, scribe  I discussed the limitations of evaluation and management by telemedicine and the availability of in person appointments. The patient expressed understanding and agreed to proceed.  Chief Complaint  Patient presents with   chest cold    Having temps 99.3, ongoing x3-4 weeks. Alternating tylenol and ibuprofen. Started a month ago with chest infection x 2 weeks, moved up to head, and now body aches and elevated temps.    HPI: [name] is a 63 y.o. male with a PMHx significant for GERD, allergic rhinitis, HLD, ADHD, and depression who is being seen on video today for persistent respiratory symptoms as described above.   Patient complains of various symptoms starting on 1/18.  He had headaches, congestion, and rhinorrhea for 3 weeks starting from 1/18, but had no fever. He took dayquil, nyquil, and mucinex.  After the 3 weeks, he felt better for about a week, but has since had those symptoms return as well as low grade fever, body aches, occasionally productive cough, fatigue, and chills for the last 2 weeks.  Max temp 99.5 F.  He has been alternating ibuprofen and extra strength tylenol over that time.  Also mentions he had some burning sensation in his urine yesterday, but denies changes in his urine stream, penile drainage, or rectal pain.   He has a hx of asthma, but has not been wheezing.  He has been checking regularly for Covid, last test yesterday and still negative.  Mentions he had 3 trade shows in January and was exposed to a lot of people.  Pertinent negatives include visual changes, sore throat, SOB, abdominal pain, nausea, vomiting, changes in bowel habits,  gross hematuria, urinary frequency, lower extremity edema, or skin rash. Negative for insect/animal bites, known sick contact, or recent overseas travel.   ROS: See pertinent positives and negatives per HPI.  Past Medical History:  Diagnosis Date   Allergy    Anxiety    Arthritis    shoulders,ankles,lower back   Asthma    as a child   Back pain    Cancer (HCC)    melanoma   Depression    Edema    Ganglion of right wrist    GERD (gastroesophageal reflux disease)    Hernia, inguinal    Hip pain    Hyperlipidemia    Melanoma in situ of nose (HCC) 11/2012   Rotator cuff tear    LEFT   Shoulder pain     Past Surgical History:  Procedure Laterality Date   COLONOSCOPY  06/25/2021   per Dr. Adela Lank, benign polyps, repeat in 7 to 10 yrs   ESOPHAGOGASTRODUODENOSCOPY  04/25/2006   HERNIA REPAIR     left knee scope     POLYPECTOMY     TONSILLECTOMY AND ADENOIDECTOMY     UMBILICAL HERNIA REPAIR N/A 03/25/2012   Procedure: HERNIA REPAIR-- UMBILICAL-- ADULT;  Surgeon: Valarie Merino, MD;  Location: [facility];  Service: General;  Laterality: N/A;   UMBILICAL HERNIA REPAIR N/A 05/16/2016   Procedure: LAPAROSCOPIC ASSISTED REPAIR OF RECURRENT UMBILICAL HERNIA WITH MESH;  Surgeon: Luretha Murphy, MD;  Location: [facility];  Service: General;  Laterality: N/A;    Family History  Problem Relation Age  of Onset   Hypertension Mother    Hyperlipidemia Mother    Depression Mother    Anxiety disorder Mother    Obesity Mother    Pancreatic cancer Father    Hyperlipidemia Father    Heart disease Father    Cancer Father    Depression Father    Anxiety disorder Father    Sleep apnea Father    Obesity Father    Colon cancer Neg Hx    Colon polyps Neg Hx    Crohn's disease Neg Hx    Esophageal cancer Neg Hx    Rectal cancer Neg Hx    Stomach cancer Neg Hx     Social History   Socioeconomic History   Marital status: Married    Spouse name: Not on file   Number  of children: Not on file   Years of education: Not on file   Highest education level: Not on file  Occupational History   Not on file  Tobacco Use   Smoking status: Former    Current packs/day: 0.00    Types: Cigarettes    Quit date: 01/08/2018    Years since quitting: 5.1    Passive exposure: Never   Smokeless tobacco: Never  Vaping Use   Vaping status: Never Used  Substance and Sexual Activity   Alcohol use: Not Currently    Alcohol/week: 12.0 standard drinks of alcohol    Types: 12 Cans of beer per week   Drug use: No   Sexual activity: Yes  Other Topics Concern   Not on file  Social History Narrative   Not on file   Social Drivers of Health   Financial Resource Strain: Patient Declined (04/24/2022)   Overall Financial Resource Strain (CARDIA)    Difficulty of Paying Living Expenses: Patient declined  Food Insecurity: Patient Declined (04/24/2022)   Hunger Vital Sign    Worried About Running Out of Food in the Last Year: Patient declined    Ran Out of Food in the Last Year: Patient declined  Transportation Needs: Patient Declined (04/24/2022)   PRAPARE - Administrator, Civil Service (Medical): Patient declined    Lack of Transportation (Non-Medical): Patient declined  Physical Activity: Insufficiently Active (04/24/2022)   Exercise Vital Sign    Days of Exercise per Week: 3 days    Minutes of Exercise per Session: 30 min  Stress: Stress Concern Present (04/24/2022)   [employer] of Occupational Health - Occupational Stress Questionnaire    Feeling of Stress : To some extent  Social Connections: Unknown (04/24/2022)   Social Connection and Isolation Panel [NHANES]    Frequency of Communication with Friends and Family: Patient declined    Frequency of Social Gatherings with Friends and Family: Patient declined    Attends Religious Services: Patient declined    Database administrator or Organizations: Patient declined    Attends Banker Meetings:  Not on file    Marital Status: Married  Intimate Partner Violence: Unknown (07/30/2021)   Received from [employer], [facility]   HITS    Physically Hurt: Not on file    Insult or Talk Down To: Not on file    Threaten Physical Harm: Not on file    Scream or Curse: Not on file     Current Outpatient Medications:    Acetaminophen (TYLENOL PO), Take by mouth as needed., Disp: , Rfl:    acyclovir (ZOVIRAX) 200 MG capsule, TAKE 1 CAPSULE BY MOUTH FIVE  TIMES DAILY, Disp: 100 capsule, Rfl: 3   albuterol (PROVENTIL HFA) 108 (90 Base) MCG/ACT inhaler, Inhale 2 puffs into the lungs every 4 (four) hours as needed for wheezing or shortness of breath., Disp: 18 g, Rfl: 5   amphetamine-dextroamphetamine (ADDERALL XR) 10 MG 24 hr capsule, Take 1 capsule (10 mg total) by mouth daily. (fill 12/30/22), Disp: 30 capsule, Rfl: 0   Cholecalciferol (VITAMIN D3) 50 MCG (2000 UT) capsule, 2,000 international UNITS VIT d3 DAILY, Disp: , Rfl:    citalopram (CELEXA) 20 MG tablet, Take 1 tablet (20 mg total) by mouth daily., Disp: 90 tablet, Rfl: 3   doxycycline (VIBRA-TABS) 100 MG tablet, Take 1 tablet (100 mg total) by mouth 2 (two) times daily for 7 days., Disp: 14 tablet, Rfl: 0   Ibuprofen (ADVIL PO), Take by mouth as needed., Disp: , Rfl:    IBUPROFEN PO, Take by mouth as needed., Disp: , Rfl:    omeprazole (PRILOSEC) 20 MG capsule, Take 1 capsule (20 mg total) by mouth daily., Disp: 90 capsule, Rfl: 3   simvastatin (ZOCOR) 80 MG tablet, Take 1 tablet (80 mg total) by mouth daily., Disp: 90 tablet, Rfl: 3  EXAM:  VITALS per patient if applicable:Temp 99.1 F (37.3 C)   Ht 5\' 7"  (1.702 m)   BMI 33.05 kg/m   GENERAL: alert, oriented, appears well and in no acute distress  HEENT: atraumatic, conjunctiva clear, no obvious abnormalities on inspection of external nose and ears  NECK: normal movements of the head and neck  LUNGS: on inspection no signs of respiratory distress, breathing rate appears  normal, no obvious gross SOB, gasping or wheezing  CV: no obvious cyanosis  MS: moves all visible extremities without noticeable abnormality  PSYCH/NEURO: pleasant and cooperative, no obvious depression or anxiety, speech and thought processing grossly intact  ASSESSMENT AND PLAN:  Discussed the following assessment and plan:  Respiratory tract infection - Plan: doxycycline (VIBRA-TABS) 100 MG tablet He has had persistent symptoms for 3-4 weeks and worse for the past 2 weeks.  Explained that it is difficult to make an appropriate assessment with a video visit, we can hold on imaging for now.  I think it is appropriate to start trial of abx, recommend Doxycycline x 7 days. Continue plain Mucinex. Continue monitoring temp before taking antipyretics. Monitor for new symptoms. F/U with PCP in 2 weeks if cough and other symptoms have not resolved.   Dysuria Mild x 1. Monitor for new urinary symptoms. Continue adequate hydration. If persistent, he is going to need UA, urine culture, PSA.  We discussed possible serious and likely etiologies, options for evaluation and workup, limitations of telemedicine visit vs in person visit, treatment, treatment risks and precautions. The patient was advised to call back or seek an in-person evaluation if the symptoms worsen or if the condition fails to improve as anticipated. I discussed the assessment and treatment plan with the patient. The patient was provided an opportunity to ask questions and all were answered. The patient agreed with the plan and demonstrated an understanding of the instructions.  Return in about 2 weeks (around 03/21/2023), or if symptoms worsen or fail to improve, for cough with PCP prn.  I, Suanne Marker, acting as a scribe for Tyheem Boughner [place], MD., have documented all relevant documentation on the behalf of Wyndell Cardiff [place], MD, as directed by  Tykerria Mccubbins [place], MD while in the presence of Nandana Krolikowski [place], MD.   I, Donold Marotto [place], MD,  have reviewed all documentation  for this visit. The documentation on 03/07/23 for the exam, diagnosis, procedures, and orders are all accurate and complete.  Kare Dado [place], MD

## 2023-03-10 ENCOUNTER — Other Ambulatory Visit (HOSPITAL_COMMUNITY): Payer: Self-pay

## 2023-03-10 ENCOUNTER — Other Ambulatory Visit (INDEPENDENT_AMBULATORY_CARE_PROVIDER_SITE_OTHER): Payer: Self-pay

## 2023-03-10 DIAGNOSIS — E559 Vitamin D deficiency, unspecified: Secondary | ICD-10-CM

## 2023-03-10 MED ORDER — VITAMIN D (ERGOCALCIFEROL) 1.25 MG (50000 UNIT) PO CAPS
50000.0000 [IU] | ORAL_CAPSULE | ORAL | 0 refills | Status: DC
  Filled 2023-03-10: qty 4, 28d supply, fill #0

## 2023-03-24 ENCOUNTER — Encounter (INDEPENDENT_AMBULATORY_CARE_PROVIDER_SITE_OTHER): Payer: Self-pay | Admitting: Family Medicine

## 2023-03-24 ENCOUNTER — Ambulatory Visit (INDEPENDENT_AMBULATORY_CARE_PROVIDER_SITE_OTHER): Payer: 59 | Admitting: Family Medicine

## 2023-03-24 VITALS — BP 109/65 | HR 68 | Temp 98.0°F | Ht 67.0 in | Wt 209.0 lb

## 2023-03-24 DIAGNOSIS — E669 Obesity, unspecified: Secondary | ICD-10-CM

## 2023-03-24 DIAGNOSIS — F5089 Other specified eating disorder: Secondary | ICD-10-CM

## 2023-03-24 DIAGNOSIS — F39 Unspecified mood [affective] disorder: Secondary | ICD-10-CM | POA: Diagnosis not present

## 2023-03-24 DIAGNOSIS — E559 Vitamin D deficiency, unspecified: Secondary | ICD-10-CM

## 2023-03-24 DIAGNOSIS — E66811 Obesity, class 1: Secondary | ICD-10-CM

## 2023-03-24 DIAGNOSIS — Z6832 Body mass index (BMI) 32.0-32.9, adult: Secondary | ICD-10-CM

## 2023-03-24 MED ORDER — VITAMIN D (ERGOCALCIFEROL) 1.25 MG (50000 UNIT) PO CAPS
50000.0000 [IU] | ORAL_CAPSULE | ORAL | 0 refills | Status: DC
Start: 2023-03-24 — End: 2023-04-21

## 2023-03-24 NOTE — Progress Notes (Signed)
 Connor Thomas, D.O.  ABFM, ABOM Specializing in Clinical Bariatric Medicine  Office located at: [address] [place], [place]  [address]   Assessment and Plan:   FOR THE DISEASE OF OBESITY: BMI 32.0-32.9,adult -- Curent BMI 32.73 Obesity (BMI 30.0-34.9) -starting bmi 12/31/22 32.88 Assessment & Plan: Since last office visit on 02/24/23 patient's muscle mass has decreased by 2.6 lb. Fat mass has increased by 1 lb. Total body water has decreased by 1 lb.  Counseling done on how various foods will affect these numbers and how to maximize success  Total lbs lost to date: 1 lb Total weight loss percentage to date: -0.48%   Recommended Dietary Goals Connor Thomas is currently in the action stage of change. As such, his goal is to continue weight management plan.  He has agreed to:  Continue on CAT 3 MP and add B/L options with the option of  journaling.   Behavioral Intervention We discussed the following today: increasing lean protein intake to established goals, decreasing simple carbohydrates , work on tracking and journaling calories using tracking application, keeping healthy foods at home, decreasing eating out or consumption of processed foods, and making healthy choices when eating convenient foods, practice mindfulness eating and understand the difference between hunger signals and cravings, work on managing stress, creating time for self-care and relaxation, avoiding temptations and identifying enticing environmental cues, and continue to work on implementation of reduced calorie nutritional plan  Additional resources provided today: Handout on resistance training exercises using bands or free weights, Handout on CAT 3-4 breakfast options, and Handout on CAT 3-4 lunch options  Evidence-based interventions for health behavior change were utilized today including the discussion of self monitoring techniques, problem-solving barriers and SMART goal setting techniques.   Regarding  patient's less desirable eating habits and patterns, we employed the technique of small changes.   Pt will specifically work on: Increase weight lifting to 2 sets of 12 reps for 3 days per week and follow category 3 meal plan with option of journaling for next visit.     Recommended Physical Activity Goals Connor Thomas has been advised to work up to 150 minutes of moderate intensity aerobic activity a week and strengthening exercises 2-3 times per week for cardiovascular health, weight loss maintenance and preservation of muscle mass.   He has agreed to :  Think about enjoyable ways to increase daily physical activity and overcoming barriers to exercise, Increase physical activity in their day and reduce sedentary time (increase NEAT)., and Increase his weight lifting to 3 days per week doing 2 sets of 12 reps.  Pt also encouraged to also look up YouTube videos for at home exercising if he desires.    Pharmacotherapy We both agreed to: continue with nutritional and behavioral strategies   FOR ASSOCIATED CONDITIONS ADDRESSED TODAY: Vitamin D insufficiency Assessment & Plan: Lab Results  Component Value Date   VD25OH 39.7 12/31/2022  Pt is on ERGO 50K units once weekly. Tolerating well with no SE reported today. No acute concerns in this regard today.   Continue with current supplementation as prescribed. Will refill ERGO today with no changes made. Will continue to monitor his condition as it pertains to his weight loss journey.   Orders: - Refill ERGO, no dose changes   Mood disorder (HCC) - emotional eating Assessment & Plan: Moods stable currently. Connor Thomas reports an increase of stress that is mainly work-related. He is compliant with Celexa 20 mg once daily, managed by PCP.  We discussed the importance of stress management for his overall health and weight loss, highlighting exercise as a beneficial outlet for stress. Reviewed multiple resistance training and weight lifting exercises.  Encouraged pt to increase his protein intake to improve emotional eating and any hunger/cravings. Continue with current medication regimen as directed by his PCP. Will continue to monitor condition.    Follow up:   Return in about 4 weeks (around 04/21/2023) for Keep upcoming appt, make another appt 3-4 weeks after.Marland Kitchen He was informed of the importance of frequent follow up visits to maximize his success with intensive lifestyle modifications for his multiple health conditions.  Subjective:   Chief complaint: Obesity Connor Thomas is here to discuss his progress with his obesity treatment plan. He is on the Category 3 Plan and states he is following his eating plan approximately 50% of the time. He states he is not exercising.  Interval History:  Connor Thomas is here for a follow up office visit. Since last OV on 02/24/23, he has lost 2 lbs. Given his wife his in the program, he has been eating more of what she is cooking for her meal plan. This consists of a high protein, low carb, low calorie, low fat diet.   Pharmacotherapy for weight loss: He is currently taking no anti-obesity medication.   Review of Systems:  Pertinent positives were addressed with patient today.  Reviewed by clinician on day of visit: allergies, medications, problem list, medical history, surgical history, family history, social history, and previous encounter notes.  Weight Summary and Biometrics   Weight Lost Since Last Visit: 2 lb  Weight Gained Since Last Visit: 0    Vitals Temp: 98 F (36.7 C) BP: 109/65 Pulse Rate: 68 SpO2: 98 %   Anthropometric Measurements Height: 5\' 7"  (1.702 m) Weight: 209 lb (94.8 kg) BMI (Calculated): 32.73 Weight at Last Visit: 211 lb Weight Lost Since Last Visit: 2 lb Weight Gained Since Last Visit: 0 Starting Weight: 210 lb Total Weight Loss (lbs): 1 lb (0.454 kg) Peak Weight: 264 lb   Body Composition  Body Fat %: 29.2 % Fat Mass (lbs): 61.2 lbs Muscle Mass (lbs): 140.8  lbs Total Body Water (lbs): 100 lbs Visceral Fat Rating : 17   Other Clinical Data Fasting: No Labs: No Today's Visit #: 3 Starting Date: 12/31/22    Objective:   PHYSICAL EXAM: Blood pressure 109/65, pulse 68, temperature 98 F (36.7 C), height 5\' 7"  (1.702 m), weight 209 lb (94.8 kg), SpO2 98%. Body mass index is 32.73 kg/m.  General: he is overweight, cooperative and in no acute distress. PSYCH: Has normal mood, affect and thought process.   HEENT: EOMI, sclerae are anicteric. Lungs: Normal breathing effort, no conversational dyspnea. Extremities: Moves * 4 Neurologic: A and O * 3, good insight  DIAGNOSTIC DATA REVIEWED: BMET    Component Value Date/Time   NA 142 12/31/2022 0936   K 4.9 12/31/2022 0936   CL 103 12/31/2022 0936   CO2 26 12/31/2022 0936   GLUCOSE 101 (H) 12/31/2022 0936   GLUCOSE 106 (H) 07/08/2022 0838   GLUCOSE 93 01/27/2006 0917   BUN 12 12/31/2022 0936   CREATININE 1.01 12/31/2022 0936   CREATININE 0.91 12/14/2019 1010   CALCIUM 9.3 12/31/2022 0936   GFRNONAA 92 12/14/2019 1010   GFRAA 107 12/14/2019 1010   Lab Results  Component Value Date   HGBA1C 5.8 (H) 12/31/2022   HGBA1C 5.7 10/17/2014   Lab Results  Component Value Date  INSULIN 7.8 12/31/2022   Lab Results  Component Value Date   TSH 1.670 12/31/2022   CBC    Component Value Date/Time   WBC 7.0 12/31/2022 0936   WBC 6.6 07/08/2022 0838   RBC 4.75 12/31/2022 0936   RBC 4.60 07/08/2022 0838   HGB 14.2 12/31/2022 0936   HCT 42.9 12/31/2022 0936   PLT 320 12/31/2022 0936   MCV 90 12/31/2022 0936   MCH 29.9 12/31/2022 0936   MCH 30.7 12/14/2019 1010   MCHC 33.1 12/31/2022 0936   MCHC 34.2 07/08/2022 0838   RDW 12.4 12/31/2022 0936   Iron Studies No results found for: "IRON", "TIBC", "FERRITIN", "IRONPCTSAT" Lipid Panel     Component Value Date/Time   CHOL 171 12/31/2022 0936   TRIG 83 12/31/2022 0936   TRIG 119 01/27/2006 0917   HDL 49 12/31/2022 0936    CHOLHDL 4 07/08/2022 0838   VLDL 18.4 07/08/2022 0838   LDLCALC 106 (H) 12/31/2022 0936   LDLCALC 103 (H) 12/14/2019 1010   LDLDIRECT 144.9 03/10/2008 0812   Hepatic Function Panel     Component Value Date/Time   PROT 7.1 12/31/2022 0936   ALBUMIN 4.7 12/31/2022 0936   AST 22 12/31/2022 0936   ALT 29 12/31/2022 0936   ALKPHOS 76 12/31/2022 0936   BILITOT 0.3 12/31/2022 0936   BILIDIR 0.1 07/08/2022 0838   IBILI 0.6 12/14/2019 1010      Component Value Date/Time   TSH 1.670 12/31/2022 0936   Nutritional Lab Results  Component Value Date   VD25OH 39.7 12/31/2022    Attestations:   I, Isabelle Course, acting as a medical scribe for Thomasene Lot, DO., have compiled all relevant documentation for today's office visit on behalf of Thomasene Lot, DO, while in the presence of [employer], DO.  Reviewed by clinician on day of visit: allergies, medications, problem list, medical history, surgical history, family history, social history, and previous encounter notes pertinent to patient's obesity diagnosis.  I have reviewed the above documentation for accuracy and completeness, and I agree with the above. Connor Thomas, D.O.  The 21st Century Cures Act was signed into law in 2016 which includes the topic of electronic health records.  This provides immediate access to information in MyChart.  This includes consultation notes, operative notes, office notes, lab results and pathology reports.  If you have any questions about what you read please let us know at your next visit so we can discuss your concerns and take corrective action if need be.  We are right here with you.

## 2023-03-25 ENCOUNTER — Other Ambulatory Visit (HOSPITAL_COMMUNITY): Payer: Self-pay

## 2023-04-01 ENCOUNTER — Other Ambulatory Visit (HOSPITAL_COMMUNITY): Payer: Self-pay

## 2023-04-01 ENCOUNTER — Other Ambulatory Visit: Payer: Self-pay | Admitting: Family Medicine

## 2023-04-02 ENCOUNTER — Other Ambulatory Visit (HOSPITAL_COMMUNITY): Payer: Self-pay

## 2023-04-02 ENCOUNTER — Other Ambulatory Visit: Payer: Self-pay

## 2023-04-02 MED ORDER — AMPHETAMINE-DEXTROAMPHET ER 10 MG PO CP24
10.0000 mg | ORAL_CAPSULE | Freq: Every day | ORAL | 0 refills | Status: DC
  Filled 2023-04-02: qty 30, 30d supply, fill #0

## 2023-04-02 MED ORDER — AMPHETAMINE-DEXTROAMPHET ER 10 MG PO CP24
10.0000 mg | ORAL_CAPSULE | Freq: Every day | ORAL | 0 refills | Status: DC
Start: 2023-05-03 — End: 2023-05-16
  Filled 2023-04-02: qty 30, 30d supply, fill #0

## 2023-04-02 MED ORDER — AMPHETAMINE-DEXTROAMPHET ER 10 MG PO CP24
10.0000 mg | ORAL_CAPSULE | Freq: Every day | ORAL | 0 refills | Status: DC
Start: 2023-06-02 — End: 2023-06-23
  Filled 2023-04-02: qty 30, 30d supply, fill #0

## 2023-04-02 NOTE — Telephone Encounter (Signed)
 Pt LOV was on 07/08/22 Last refill was done on 10/30/22 Please advise

## 2023-04-02 NOTE — Telephone Encounter (Signed)
 Done

## 2023-04-03 ENCOUNTER — Other Ambulatory Visit (HOSPITAL_COMMUNITY): Payer: Self-pay

## 2023-04-03 ENCOUNTER — Other Ambulatory Visit (INDEPENDENT_AMBULATORY_CARE_PROVIDER_SITE_OTHER): Payer: Self-pay | Admitting: Family Medicine

## 2023-04-03 DIAGNOSIS — E559 Vitamin D deficiency, unspecified: Secondary | ICD-10-CM

## 2023-04-21 ENCOUNTER — Ambulatory Visit (INDEPENDENT_AMBULATORY_CARE_PROVIDER_SITE_OTHER): Payer: 59 | Admitting: Family Medicine

## 2023-04-21 ENCOUNTER — Encounter (INDEPENDENT_AMBULATORY_CARE_PROVIDER_SITE_OTHER): Payer: Self-pay | Admitting: Family Medicine

## 2023-04-21 ENCOUNTER — Other Ambulatory Visit (HOSPITAL_COMMUNITY): Payer: Self-pay

## 2023-04-21 VITALS — BP 110/64 | HR 64 | Temp 98.5°F | Ht 67.0 in | Wt 210.0 lb

## 2023-04-21 DIAGNOSIS — Z6832 Body mass index (BMI) 32.0-32.9, adult: Secondary | ICD-10-CM

## 2023-04-21 DIAGNOSIS — E559 Vitamin D deficiency, unspecified: Secondary | ICD-10-CM

## 2023-04-21 DIAGNOSIS — E669 Obesity, unspecified: Secondary | ICD-10-CM | POA: Diagnosis not present

## 2023-04-21 DIAGNOSIS — E66811 Obesity, class 1: Secondary | ICD-10-CM

## 2023-04-21 DIAGNOSIS — R7303 Prediabetes: Secondary | ICD-10-CM | POA: Diagnosis not present

## 2023-04-21 MED ORDER — METFORMIN HCL 500 MG PO TABS
500.0000 mg | ORAL_TABLET | Freq: Every day | ORAL | 0 refills | Status: DC
  Filled 2023-04-21: qty 30, 30d supply, fill #0

## 2023-04-21 MED ORDER — VITAMIN D (ERGOCALCIFEROL) 1.25 MG (50000 UNIT) PO CAPS
50000.0000 [IU] | ORAL_CAPSULE | ORAL | 0 refills | Status: DC
Start: 2023-04-21 — End: 2023-06-09
  Filled 2023-04-21 – 2023-05-12 (×2): qty 4, 28d supply, fill #0

## 2023-04-21 NOTE — Progress Notes (Signed)
 Connor Thomas, D.O.  ABFM, ABOM Specializing in Clinical Bariatric Medicine  Office located at: [address] [place], [place]  [address]   Assessment and Plan:  No orders of the defined types were placed in this encounter.   Medications Discontinued During This Encounter  Medication Reason   Vitamin D, Ergocalciferol, (DRISDOL) 1.25 MG (50000 UNIT) CAPS capsule Reorder     Meds ordered this encounter  Medications   Vitamin D, Ergocalciferol, (DRISDOL) 1.25 MG (50000 UNIT) CAPS capsule    Sig: Take 1 capsule (50,000 Units total) by mouth every 7 (seven) days for 28 days.    Dispense:  4 capsule    Refill:  0   metFORMIN (GLUCOPHAGE) 500 MG tablet    Sig: Take 1 tablet (500 mg total) by mouth daily with lunch. **Office visit for refill **   Do not send refill request    Dispense:  30 tablet    Refill:  0    30 d supply;  ** OV for RF **   Do not send RF request      Next OV: RECHECK kidney function, A1c, Vit D, B12 etc.  FOR THE DISEASE OF OBESITY:  BMI 32.0-32.9,adult -- Curent BMI 32.88 Obesity (BMI 30.0-34.9) -starting bmi 12/31/22 32.88 Assessment & Plan: Since last office visit on 03/24/2023 patient's  Muscle mass has increased by 2 lb. Fat mass has decreased by 0.8 lb. Total body water has increased by 0.8 lb.  Counseling done on how various foods will affect these numbers and how to maximize success  Total lbs lost to date: 0 lb Total weight loss percentage to date: 0%    Recommended Dietary Goals Connor Thomas is currently in the action stage of change. As such, his goal is to continue weight management plan.  He has agreed to: Pt provided the option to follow either the CAT 3 MP or the Low Carb MP.   Behavioral Intervention We discussed the following today:  the importance of weighing lean proteins, planning for success, staying on track while traveling and eating out, focusing on foods with a 10:1 ratio of calories to grams of protein, protein shake  options, healthier sweet options.   Additional resources provided today: Handout on CAT 3 meal plan and Handout on risks/ benefits of metformin and associated Myths of use, Handout on Low Carb MP.   Evidence-based interventions for health behavior change were utilized today including the discussion of self monitoring techniques, problem-solving barriers and SMART goal setting techniques.   Regarding patient's less desirable eating habits and patterns, we employed the technique of small changes.   Pt will specifically work on: consistently measuring lean protein intake.   Recommended Physical Activity Goals Connor Thomas has been advised to work up to 150 minutes of moderate intensity aerobic activity a week and strengthening exercises 2-3 times per week for cardiovascular health, weight loss maintenance and preservation of muscle mass.   He has agreed to : continue to gradually increase the amount and intensity of exercise routine   Pharmacotherapy We both agreed to :  start Metformin   FOR ASSOCIATED CONDITIONS ADDRESSED TODAY:  Prediabetes Assessment & Plan: Most recent Hba1c: Lab Results  Component Value Date   HGBA1C 5.8 (H) 12/31/2022   Diet/lifestyle controlled. Endorses having a "strong sweet tooth" (especially for ice-cream). After discussion of anticipated benefits and potential side effects patient is agreeable to STARTING Metformin. Explained that the more he eats "off-plan", the more likely for him to have  side effects of the drug. Pt understands to start with 0.5 tablet for 4-6 days and if tolerating well to increase to one full tablet. Continue to decrease simple carbs/ sugars; increase fiber and proteins -> follow his meal plan.    Relevant Orders:  -     metFORMIN HCl; 1 po with lunch daily  Dispense: 30 tablet; Refill: 0   Vitamin D insufficiency Assessment & Plan: Pt is doing well on Ergocalciferol 50,000 units once weekly. Continue regimen. Recheck  periodically.  Relevant Orders:  -     Vitamin D (Ergocalciferol); Take 1 capsule (50,000 Units total) by mouth every 7 (seven) days for 28 days.  Dispense: 4 capsule; Refill: 0   Follow up:   Return 05/19/2023. He was informed of the importance of frequent follow up visits to maximize his success with intensive lifestyle modifications for his multiple health conditions.  Subjective:   Chief complaint: Obesity Connor Thomas is here to discuss his progress with his obesity treatment plan. He is on the Category 3 Plan w/ B & L options and the option to journal. States he is following his eating plan approximately 70% of the time. He states he is biking 40 minutes, 3 days per week.  Interval History:  Connor Thomas is here for a follow up office visit. Since last OV on 03/24/2023, Connor Thomas is up 1 lb. States he travels a lot for work and therefore eats out frequently. Overall, he feels that his portions are too large. Snacks on nuts and cheese. Reports needing to measure his lean protein intake more often.  Pharmacotherapy for weight loss: He is currently taking no anti-obesity medication.   Review of Systems:  Pertinent positives were addressed with patient today.  Reviewed by clinician on day of visit: allergies, medications, problem list, medical history, surgical history, family history, social history, and previous encounter notes.  Weight Summary and Biometrics   Weight Lost Since Last Visit: 0  Weight Gained Since Last Visit: 1lb   Vitals Temp: 98.5 F (36.9 C) BP: 110/64 Pulse Rate: 64 SpO2: 98 %   Anthropometric Measurements Height: 5\' 7"  (1.702 m) Weight: 210 lb (95.3 kg) BMI (Calculated): 32.88 Weight at Last Visit: 209lb Weight Lost Since Last Visit: 0 Weight Gained Since Last Visit: 1lb Starting Weight: 210lb Total Weight Loss (lbs): 0 lb (0 kg) Peak Weight: 264lb   Body Composition  Body Fat %: 28.7 % Fat Mass (lbs): 60.4 lbs Muscle Mass (lbs): 142.8 lbs Total  Body Water (lbs): 100.8 lbs Visceral Fat Rating : 17   Other Clinical Data Fasting: no Labs: no Today's Visit #: 4 Starting Date: 12/31/22   Objective:   PHYSICAL EXAM: Blood pressure 110/64, pulse 64, temperature 98.5 F (36.9 C), height 5\' 7"  (1.702 m), weight 210 lb (95.3 kg), SpO2 98%. Body mass index is 32.89 kg/m.  General: he is overweight, cooperative and in no acute distress. PSYCH: Has normal mood, affect and thought process.   HEENT: EOMI, sclerae are anicteric. Lungs: Normal breathing effort, no conversational dyspnea. Extremities: Moves * 4 Neurologic: A and O * 3, good insight  DIAGNOSTIC DATA REVIEWED: BMET    Component Value Date/Time   NA 142 12/31/2022 0936   K 4.9 12/31/2022 0936   CL 103 12/31/2022 0936   CO2 26 12/31/2022 0936   GLUCOSE 101 (H) 12/31/2022 0936   GLUCOSE 106 (H) 07/08/2022 0838   GLUCOSE 93 01/27/2006 0917   BUN 12 12/31/2022 0936   CREATININE 1.01 12/31/2022 0936  CREATININE 0.91 12/14/2019 1010   CALCIUM 9.3 12/31/2022 0936   GFRNONAA 92 12/14/2019 1010   GFRAA 107 12/14/2019 1010   Lab Results  Component Value Date   HGBA1C 5.8 (H) 12/31/2022   HGBA1C 5.7 10/17/2014   Lab Results  Component Value Date   INSULIN 7.8 12/31/2022   Lab Results  Component Value Date   TSH 1.670 12/31/2022   CBC    Component Value Date/Time   WBC 7.0 12/31/2022 0936   WBC 6.6 07/08/2022 0838   RBC 4.75 12/31/2022 0936   RBC 4.60 07/08/2022 0838   HGB 14.2 12/31/2022 0936   HCT 42.9 12/31/2022 0936   PLT 320 12/31/2022 0936   MCV 90 12/31/2022 0936   MCH 29.9 12/31/2022 0936   MCH 30.7 12/14/2019 1010   MCHC 33.1 12/31/2022 0936   MCHC 34.2 07/08/2022 0838   RDW 12.4 12/31/2022 0936   Iron Studies No results found for: "IRON", "TIBC", "FERRITIN", "IRONPCTSAT" Lipid Panel     Component Value Date/Time   CHOL 171 12/31/2022 0936   TRIG 83 12/31/2022 0936   TRIG 119 01/27/2006 0917   HDL 49 12/31/2022 0936   CHOLHDL 4  07/08/2022 0838   VLDL 18.4 07/08/2022 0838   LDLCALC 106 (H) 12/31/2022 0936   LDLCALC 103 (H) 12/14/2019 1010   LDLDIRECT 144.9 03/10/2008 0812   Hepatic Function Panel     Component Value Date/Time   PROT 7.1 12/31/2022 0936   ALBUMIN 4.7 12/31/2022 0936   AST 22 12/31/2022 0936   ALT 29 12/31/2022 0936   ALKPHOS 76 12/31/2022 0936   BILITOT 0.3 12/31/2022 0936   BILIDIR 0.1 07/08/2022 0838   IBILI 0.6 12/14/2019 1010      Component Value Date/Time   TSH 1.670 12/31/2022 0936   Nutritional Lab Results  Component Value Date   VD25OH 39.7 12/31/2022    Attestations:   I, Special Puri, acting as a Stage manager for [employer], DO., have compiled all relevant documentation for today's office visit on behalf of Thomasene Lot, DO, while in the presence of [employer], DO.  Reviewed by clinician on day of visit: allergies, medications, problem list, medical history, surgical history, family history, social history, and previous encounter notes pertinent to patient's obesity diagnosis. I have spent 42 minutes in the care of the patient today including: preparing to see patient (e.g. review and interpretation of tests, old notes ), obtaining and/or reviewing separately obtained history, performing a medically appropriate examination or evaluation, counseling and educating the patient, ordering medications, test or procedures, documenting clinical information in the electronic or other health care record, and independently interpreting results and communicating results to the patient, family, or caregiver   I have reviewed the above documentation for accuracy and completeness, and I agree with the above. Connor Thomas, D.O.  The 21st Century Cures Act was signed into law in 2016 which includes the topic of electronic health records.  This provides immediate access to information in MyChart.  This includes consultation notes, operative notes, office notes, lab results  and pathology reports.  If you have any questions about what you read please let us know at your next visit so we can discuss your concerns and take corrective action if need be.  We are right here with you.

## 2023-04-26 ENCOUNTER — Other Ambulatory Visit (HOSPITAL_COMMUNITY): Payer: Self-pay

## 2023-05-02 ENCOUNTER — Other Ambulatory Visit (HOSPITAL_COMMUNITY): Payer: Self-pay

## 2023-05-12 ENCOUNTER — Other Ambulatory Visit (HOSPITAL_COMMUNITY): Payer: Self-pay

## 2023-05-12 ENCOUNTER — Encounter: Payer: Self-pay | Admitting: Family Medicine

## 2023-05-14 ENCOUNTER — Encounter (INDEPENDENT_AMBULATORY_CARE_PROVIDER_SITE_OTHER): Payer: Self-pay | Admitting: Family Medicine

## 2023-05-14 ENCOUNTER — Ambulatory Visit (INDEPENDENT_AMBULATORY_CARE_PROVIDER_SITE_OTHER): Admitting: Family Medicine

## 2023-05-14 ENCOUNTER — Other Ambulatory Visit (HOSPITAL_COMMUNITY): Payer: Self-pay

## 2023-05-14 VITALS — HR 64 | Temp 99.0°F | Ht 67.0 in | Wt 206.0 lb

## 2023-05-14 DIAGNOSIS — E559 Vitamin D deficiency, unspecified: Secondary | ICD-10-CM

## 2023-05-14 DIAGNOSIS — J302 Other seasonal allergic rhinitis: Secondary | ICD-10-CM | POA: Diagnosis not present

## 2023-05-14 DIAGNOSIS — Z6832 Body mass index (BMI) 32.0-32.9, adult: Secondary | ICD-10-CM

## 2023-05-14 DIAGNOSIS — J3089 Other allergic rhinitis: Secondary | ICD-10-CM | POA: Diagnosis not present

## 2023-05-14 DIAGNOSIS — E669 Obesity, unspecified: Secondary | ICD-10-CM

## 2023-05-14 DIAGNOSIS — E66811 Obesity, class 1: Secondary | ICD-10-CM

## 2023-05-14 DIAGNOSIS — R7303 Prediabetes: Secondary | ICD-10-CM | POA: Diagnosis not present

## 2023-05-14 MED ORDER — MONTELUKAST SODIUM 10 MG PO TABS
10.0000 mg | ORAL_TABLET | Freq: Every day | ORAL | 0 refills | Status: AC
Start: 2023-05-14 — End: ?
  Filled 2023-05-14: qty 30, 30d supply, fill #0

## 2023-05-14 MED ORDER — VITAMIN D (ERGOCALCIFEROL) 1.25 MG (50000 UNIT) PO CAPS
50000.0000 [IU] | ORAL_CAPSULE | ORAL | 0 refills | Status: DC
Start: 2023-05-14 — End: 2023-06-10
  Filled 2023-05-15: qty 4, 28d supply, fill #0

## 2023-05-14 MED ORDER — METFORMIN HCL ER 500 MG PO TB24
1000.0000 mg | ORAL_TABLET | Freq: Every day | ORAL | 0 refills | Status: DC
  Filled 2023-05-14: qty 60, 30d supply, fill #0

## 2023-05-14 MED ORDER — LEVOCETIRIZINE DIHYDROCHLORIDE 5 MG PO TABS
5.0000 mg | ORAL_TABLET | Freq: Every evening | ORAL | 0 refills | Status: DC
  Filled 2023-05-14: qty 30, 30d supply, fill #0

## 2023-05-14 MED ORDER — FLUTICASONE PROPIONATE 50 MCG/ACT NA SUSP
NASAL | 2 refills | Status: DC
Start: 2023-05-14 — End: 2023-11-24
  Filled 2023-05-14: qty 16, 30d supply, fill #0
  Filled 2023-07-10 – 2023-08-25 (×2): qty 16, 30d supply, fill #1

## 2023-05-14 NOTE — Progress Notes (Signed)
 Connor Thomas, D.O.  ABFM, ABOM Specializing in Clinical Bariatric Medicine  Office located at: [address] [place], [place]  [address]   Assessment and Plan:   Orders Placed This Encounter  Procedures   Magnesium   Hemoglobin A1c   Comprehensive metabolic panel with GFR   VITAMIN D  25 Hydroxy (Vit-D Deficiency, Fractures)   B12   Medications Discontinued During This Encounter  Medication Reason   metFORMIN  (GLUCOPHAGE ) 500 MG tablet    Vitamin D , Ergocalciferol , (DRISDOL ) 1.25 MG (50000 UNIT) CAPS capsule Reorder    Meds ordered this encounter  Medications   Vitamin D , Ergocalciferol , (DRISDOL ) 1.25 MG (50000 UNIT) CAPS capsule    Sig: Take 1 capsule (50,000 Units total) by mouth every 7 (seven) days for 28 days.    Dispense:  4 capsule    Refill:  0   metFORMIN  (GLUCOPHAGE -XR) 500 MG 24 hr tablet    Sig: Take 2 tablets (1,000 mg total) by mouth daily with food    Dispense:  60 tablet    Refill:  0   montelukast  (SINGULAIR ) 10 MG tablet    Sig: Take 1 tablet (10 mg total) by mouth at bedtime.    Dispense:  90 tablet    Refill:  0   levocetirizine (XYZAL ) 5 MG tablet    Sig: Take 1 tablet (5 mg total) by mouth every evening.    Dispense:  90 tablet    Refill:  0   fluticasone  (FLONASE ) 50 MCG/ACT nasal spray    Sig: Instill 1 spray into each nostril following sinus rinses twice daily    Dispense:  16 g    Refill:  2     FOR THE DISEASE OF OBESITY:  BMI 32.0-32.9,adult -- Curent BMI 32.26 Obesity (BMI 30.0-34.9) -starting bmi 12/31/22 32.88 Assessment & Plan: Since last office visit on 04/21/2023, patient's muscle mass has increased by 0.4 lbs. Fat mass has decreased by 3.2 lbs. Total body water has decreased by 0.8 lbs.  Counseling done on how various foods will affect these numbers and how to maximize success  Total lbs lost to date: 4 lbs Total weight loss percentage to date: 1.90%    Recommended Dietary Goals Connor Thomas is currently in the action  stage of change. As such, his goal is to continue weight management plan.  He has agreed to: continue current plan   Behavioral Intervention We discussed the following today: increasing lean protein intake to established goals, decreasing simple carbohydrates , and continue to practice mindfulness when eating  Additional resources provided today: Handout on benefits of metformin  for weight loss  Evidence-based interventions for health behavior change were utilized today including the discussion of self monitoring techniques, problem-solving barriers and SMART goal setting techniques.   Regarding patient's less desirable eating habits and patterns, we employed the technique of small changes.   Pt will specifically work on: n/a   Recommended Physical Activity Goals Lazer has been advised to work up to 300-450 minutes of moderate intensity aerobic activity a week and strengthening exercises 2-3 times per week for cardiovascular health, weight loss maintenance and preservation of muscle mass.   He has agreed to :  {EMEXERCISE:28847::"Think about enjoyable ways to increase daily physical activity and overcoming barriers to exercise","Increase physical activity in their day and reduce sedentary time (increase NEAT)."}   Pharmacotherapy We both agreed to : {EMagreedrx:29170}   FOR ASSOCIATED CONDITIONS ADDRESSED TODAY:  Vitamin D  insufficiency Assessment & Plan:    ERGO  50,000 units once weekly   Prediabetes Assessment & Plan:    Not feeling a difference with Metformin  Metformin  500 mg 2-3 times daily Sometimes takes more than prescribed because he doesn't feel like medication is working  Discussed risks/benefits of medication Pt preference is to take medication once daily instead of twice daily Pt prefers to significantly increase dose since he is already taking multiple doses at one time Informed pt that there is an increase in the risk of SE when increasing  dose Extensively broke down dose change of Metformin  Questions thoroughly addressed and answered Connor Thomas and seasonal allergies Assessment & Plan:   Currently taking Flonase  as needed Itchy eyes Welts Having severe symptoms Will start on Xyzal   Recommended to do NeilMed Sinus Rinse and then use Flonase   Follow up:   Return in about 26 days (around 06/09/2023). He was informed of the importance of frequent follow up visits to maximize his success with intensive lifestyle modifications for his multiple health conditions.  Connor Thomas is aware that we will review all of his lab results at our next visit together in person.  He is aware that if anything is critical/ life threatening with the results, we will be contacting him via MyChart or by my CMA will be calling them prior to the office visit to discuss acute management.    Subjective:   Chief complaint: Obesity Connor Thomas is here to discuss his progress with his obesity treatment plan. He is on the Category 3 Plan with B/L options and given the option to follow Low Carb MP and states he is following his eating plan approximately 100% of the time. He states he is doing chair exercises 10 minutes 3 days per week and walking 30 minutes 3 days per week.  Interval History:  Connor Thomas is here for a follow up office visit. Since last OV on 04/21/2023, pt is down 4 lbs. He is satisfied with foods and hunger. Desten endorses getting in all foods according to his meal plan. However, he admits to eating a little off plan during Easter.   Pharmacotherapy for weight loss: He is currently taking Metformin  500 mg 2-3 times daily.   Review of Systems:  Pertinent positives were addressed with patient today.  Reviewed by clinician on day of visit: allergies, medications, problem list, medical history, surgical history, family history, social history, and previous encounter notes.  Weight Summary and Biometrics    Weight Lost Since Last Visit: 4lb  Weight Gained Since Last Visit: 0lb    Vitals Temp: 99 F (37.2 C) Pulse Rate: 64 SpO2: 98 %   Anthropometric Measurements Height: 5\' 7"  (1.702 m) Weight: 206 lb (93.4 kg) BMI (Calculated): 32.26 Weight at Last Visit: 210lb Weight Lost Since Last Visit: 4lb Weight Gained Since Last Visit: 0lb Starting Weight: 210lb Total Weight Loss (lbs): 4 lb (1.814 kg) Peak Weight: 264lb   Body Composition  Body Fat %: 28.1 % Fat Mass (lbs): 58 lbs Muscle Mass (lbs): 141.2 lbs Total Body Water (lbs): 99.2 lbs Visceral Fat Rating : 16   Other Clinical Data Fasting: No Labs: Yes Today's Visit #: 5 Starting Date: 12/31/22    Objective:   PHYSICAL EXAM: Pulse 64, temperature 99 F (37.2 C), height 5\' 7"  (1.702 m), weight 206 lb (93.4 kg), SpO2 98%. Body mass index is 32.26 kg/m.  General: he is overweight, cooperative and in no acute distress. PSYCH: Has normal mood, affect and thought process.  HEENT: EOMI, sclerae are anicteric. Lungs: Normal breathing effort, no conversational dyspnea. Extremities: Moves * 4 Neurologic: A and O * 3, good insight  DIAGNOSTIC DATA REVIEWED: BMET    Component Value Date/Time   NA 142 12/31/2022 0936   K 4.9 12/31/2022 0936   CL 103 12/31/2022 0936   CO2 26 12/31/2022 0936   GLUCOSE 101 (H) 12/31/2022 0936   GLUCOSE 106 (H) 07/08/2022 0838   GLUCOSE 93 01/27/2006 0917   BUN 12 12/31/2022 0936   CREATININE 1.01 12/31/2022 0936   CREATININE 0.91 12/14/2019 1010   CALCIUM 9.3 12/31/2022 0936   GFRNONAA 92 12/14/2019 1010   GFRAA 107 12/14/2019 1010   Lab Results  Component Value Date   HGBA1C 5.8 (H) 12/31/2022   HGBA1C 5.7 10/17/2014   Lab Results  Component Value Date   INSULIN  7.8 12/31/2022   Lab Results  Component Value Date   TSH 1.670 12/31/2022   CBC    Component Value Date/Time   WBC 7.0 12/31/2022 0936   WBC 6.6 07/08/2022 0838   RBC 4.75 12/31/2022 0936   RBC 4.60  07/08/2022 0838   HGB 14.2 12/31/2022 0936   HCT 42.9 12/31/2022 0936   PLT 320 12/31/2022 0936   MCV 90 12/31/2022 0936   MCH 29.9 12/31/2022 0936   MCH 30.7 12/14/2019 1010   MCHC 33.1 12/31/2022 0936   MCHC 34.2 07/08/2022 0838   RDW 12.4 12/31/2022 0936   Iron Studies No results found for: "IRON", "TIBC", "FERRITIN", "IRONPCTSAT" Lipid Panel     Component Value Date/Time   CHOL 171 12/31/2022 0936   TRIG 83 12/31/2022 0936   TRIG 119 01/27/2006 0917   HDL 49 12/31/2022 0936   CHOLHDL 4 07/08/2022 0838   VLDL 18.4 07/08/2022 0838   LDLCALC 106 (H) 12/31/2022 0936   LDLCALC 103 (H) 12/14/2019 1010   LDLDIRECT 144.9 03/10/2008 0812   Hepatic Function Panel     Component Value Date/Time   PROT 7.1 12/31/2022 0936   ALBUMIN 4.7 12/31/2022 0936   AST 22 12/31/2022 0936   ALT 29 12/31/2022 0936   ALKPHOS 76 12/31/2022 0936   BILITOT 0.3 12/31/2022 0936   BILIDIR 0.1 07/08/2022 0838   IBILI 0.6 12/14/2019 1010      Component Value Date/Time   TSH 1.670 12/31/2022 0936   Nutritional Lab Results  Component Value Date   VD25OH 39.7 12/31/2022    Attestations:   I, Camryn Mix, acting as a Stage manager for [employer], DO., have compiled all relevant documentation for today's office visit on behalf of Marceil Sensor, DO, while in the presence of [employer], DO.  I have reviewed the above documentation for accuracy and completeness, and I agree with the above. Connor Thomas, D.O.  The 21st Century Cures Act was signed into law in 2016 which includes the topic of electronic health records.  This provides immediate access to information in MyChart.  This includes consultation notes, operative notes, office notes, lab results and pathology reports.  If you have any questions about what you read please let us  know at your next visit so we can discuss your concerns and take corrective action if need be.  We are right here with you.

## 2023-05-15 ENCOUNTER — Other Ambulatory Visit (HOSPITAL_COMMUNITY): Payer: Self-pay

## 2023-05-15 LAB — COMPREHENSIVE METABOLIC PANEL WITH GFR
ALT: 14 IU/L (ref 0–44)
AST: 14 IU/L (ref 0–40)
Albumin: 4.5 g/dL (ref 3.9–4.9)
Alkaline Phosphatase: 94 IU/L (ref 44–121)
BUN/Creatinine Ratio: 16 (ref 10–24)
BUN: 15 mg/dL (ref 8–27)
Bilirubin Total: 0.3 mg/dL (ref 0.0–1.2)
CO2: 25 mmol/L (ref 20–29)
Calcium: 9.3 mg/dL (ref 8.6–10.2)
Chloride: 101 mmol/L (ref 96–106)
Creatinine, Ser: 0.93 mg/dL (ref 0.76–1.27)
Globulin, Total: 2.4 g/dL (ref 1.5–4.5)
Glucose: 91 mg/dL (ref 70–99)
Potassium: 4.7 mmol/L (ref 3.5–5.2)
Sodium: 139 mmol/L (ref 134–144)
Total Protein: 6.9 g/dL (ref 6.0–8.5)
eGFR: 93 mL/min/{1.73_m2} (ref >59)

## 2023-05-15 LAB — HEMOGLOBIN A1C
Est. average glucose Bld gHb Est-mCnc: 114 mg/dL
Hgb A1c MFr Bld: 5.6 % (ref 4.8–5.6)

## 2023-05-15 LAB — VITAMIN B12: Vitamin B-12: 391 pg/mL (ref 232–1245)

## 2023-05-15 LAB — MAGNESIUM: Magnesium: 2.1 mg/dL (ref 1.6–2.3)

## 2023-05-15 LAB — VITAMIN D 25 HYDROXY (VIT D DEFICIENCY, FRACTURES): Vit D, 25-Hydroxy: 61.8 ng/mL (ref 30.0–100.0)

## 2023-05-16 ENCOUNTER — Other Ambulatory Visit: Payer: Self-pay

## 2023-05-16 ENCOUNTER — Other Ambulatory Visit: Payer: Self-pay | Admitting: Family Medicine

## 2023-05-16 ENCOUNTER — Other Ambulatory Visit (HOSPITAL_COMMUNITY): Payer: Self-pay

## 2023-05-16 MED ORDER — CITALOPRAM HYDROBROMIDE 20 MG PO TABS
20.0000 mg | ORAL_TABLET | Freq: Every day | ORAL | 3 refills | Status: DC
  Filled 2023-05-16: qty 30, 30d supply, fill #0
  Filled 2023-06-19: qty 30, 30d supply, fill #1
  Filled 2023-07-10 – 2023-07-27 (×2): qty 30, 30d supply, fill #2
  Filled 2023-08-18 – 2023-08-19 (×2): qty 30, 30d supply, fill #3

## 2023-05-16 MED ORDER — OMEPRAZOLE 20 MG PO CPDR
20.0000 mg | DELAYED_RELEASE_CAPSULE | Freq: Every day | ORAL | 3 refills | Status: DC
  Filled 2023-05-16: qty 30, 30d supply, fill #0
  Filled 2023-06-19: qty 30, 30d supply, fill #1
  Filled 2023-07-10 – 2023-07-27 (×2): qty 30, 30d supply, fill #2
  Filled 2023-08-18 – 2023-08-19 (×2): qty 30, 30d supply, fill #3
  Filled 2023-08-25 – 2023-09-26 (×2): qty 30, 30d supply, fill #4
  Filled 2023-10-30: qty 30, 30d supply, fill #5

## 2023-05-16 MED ORDER — SIMVASTATIN 80 MG PO TABS
80.0000 mg | ORAL_TABLET | Freq: Every day | ORAL | 3 refills | Status: DC
  Filled 2023-05-16: qty 30, 30d supply, fill #0
  Filled 2023-06-19: qty 30, 30d supply, fill #1
  Filled 2023-07-10 – 2023-09-26 (×3): qty 30, 30d supply, fill #2

## 2023-05-16 MED ORDER — AMPHETAMINE-DEXTROAMPHET ER 10 MG PO CP24
10.0000 mg | ORAL_CAPSULE | Freq: Every day | ORAL | 0 refills | Status: DC
  Filled 2023-05-16: qty 30, 30d supply, fill #0

## 2023-05-16 NOTE — Telephone Encounter (Signed)
 Copied from CRM [id number]. Topic: Clinical - Medication Refill >> May 16, 2023  7:58 AM Varney Gentleman wrote: Most Recent Primary Care Visit:  Provider: [place], BETTY G  Department: [facility]  Visit Type: MYCHART VIDEO VISIT  Date: 03/07/2023  Medication: simvastatin  (ZOCOR ) 80 MG tablet, citalopram  (CELEXA ) 20 MG tablet, omeprazole  (PRILOSEC) 20 MG capsule, amphetamine -dextroamphetamine  (ADDERALL XR) 10 MG 24 hr capsule  Has the patient contacted their pharmacy? Yes, contact provider (Agent: If no, request that the patient contact the pharmacy for the refill. If patient does not wish to contact the pharmacy document the reason why and proceed with request.) (Agent: If yes, when and what did the pharmacy advise?)  Is this the correct pharmacy for this prescription? Yes If no, delete pharmacy and type the correct one.  This is the patient's preferred pharmacy:  [facility] [address] [place] [address] Phone: [phone] Fax: [phone]   Has the prescription been filled recently? No  Is the patient out of the medication? Yes  Has the patient been seen for an appointment in the last year OR does the patient have an upcoming appointment? Yes  Can we respond through MyChart? Yes  Agent: Please be advised that Rx refills may take up to 3 business days. We ask that you follow-up with your pharmacy.

## 2023-05-16 NOTE — Telephone Encounter (Signed)
 Pt refills sent to [facility], please send pt adderall to the same pharmacy

## 2023-05-16 NOTE — Telephone Encounter (Signed)
 Done

## 2023-05-19 ENCOUNTER — Other Ambulatory Visit: Payer: Self-pay

## 2023-05-19 ENCOUNTER — Other Ambulatory Visit (HOSPITAL_COMMUNITY): Payer: Self-pay

## 2023-05-19 ENCOUNTER — Ambulatory Visit (INDEPENDENT_AMBULATORY_CARE_PROVIDER_SITE_OTHER): Admitting: Family Medicine

## 2023-05-19 MED ORDER — MELOXICAM 7.5 MG PO TABS
7.5000 mg | ORAL_TABLET | Freq: Two times a day (BID) | ORAL | 1 refills | Status: DC
  Filled 2023-05-19: qty 42, 21d supply, fill #0

## 2023-05-20 ENCOUNTER — Other Ambulatory Visit (HOSPITAL_COMMUNITY): Payer: Self-pay

## 2023-06-04 ENCOUNTER — Ambulatory Visit: Payer: Self-pay

## 2023-06-04 NOTE — Telephone Encounter (Signed)
 Noted.

## 2023-06-04 NOTE — Telephone Encounter (Signed)
  Chief Complaint: Urinary symptoms Symptoms: Urinary frequency, headache, urgency, body aches, fatigue,  Frequency: a few weeks Pertinent Negatives: Patient denies burning during urination, discharge, blood in urine Disposition: [] ED /[x] Urgent Care (no appt availability in office) / [] Appointment(In office/virtual)/ []  [facility]/ [] Home Care/ [] Refused Recommended Disposition /[]  Mobile Bus/ []  Follow-up with PCP Additional Notes: Patient called and advised that a few weeks ago he thought he was coming down with a sinus infection. Then he started having all over body aches, a low grade fever. Patient drinks about 80-100 ounces of water and he states that he is having some painful urination. Patient states that he experiences burning while holding his urine due to driving all the time.  Patient recently had his foot checked out and is currently wearing a brace.  Patient does endorse a low grade fever and some soreness around his left flank area.  Patient states that he is in [facility] today and going to [place]  tomorrow.   Call disconnected prior to finishing this Triage but patient finished Triage with another Triage RN.    Reason for Disposition . Side (flank) or lower back pain present  Answer Assessment - Initial Assessment Questions 1. SYMPTOM: "What's the main symptom you're concerned about?" (e.g., frequency, incontinence)     frequency 2. ONSET: "When did the  frequency/urgency  start?"     A few weeks ago 3. PAIN: "Is there any pain?" If Yes, ask: "How bad is it?" (Scale: 1-10; mild, moderate, severe)     "Uncomfortable" 4. CAUSE: "What do you think is causing the symptoms?"     unsure 5. OTHER SYMPTOMS: "Do you have any other symptoms?" (e.g., blood in urine, fever, flank pain, pain with urination)     Soreness around left flank area  Protocols used: Urinary Symptoms-A-AH

## 2023-06-04 NOTE — Telephone Encounter (Signed)
 Was on phone with [facility] and disconnected; Patient states going to UC due to leaving the state tomorrow.  Care advice given, denies questions; instructed to go to ER if becomes worse.  See previous triage note.

## 2023-06-06 ENCOUNTER — Ambulatory Visit: Admitting: Family Medicine

## 2023-06-06 ENCOUNTER — Encounter: Payer: Self-pay | Admitting: Family Medicine

## 2023-06-06 VITALS — BP 122/60 | HR 80 | Temp 99.1°F | Resp 16 | Ht 67.0 in | Wt 208.0 lb

## 2023-06-06 DIAGNOSIS — E785 Hyperlipidemia, unspecified: Secondary | ICD-10-CM | POA: Diagnosis not present

## 2023-06-06 DIAGNOSIS — R5383 Other fatigue: Secondary | ICD-10-CM | POA: Diagnosis not present

## 2023-06-06 DIAGNOSIS — R350 Frequency of micturition: Secondary | ICD-10-CM

## 2023-06-06 DIAGNOSIS — M791 Myalgia, unspecified site: Secondary | ICD-10-CM | POA: Diagnosis not present

## 2023-06-06 DIAGNOSIS — K5900 Constipation, unspecified: Secondary | ICD-10-CM

## 2023-06-06 DIAGNOSIS — R519 Headache, unspecified: Secondary | ICD-10-CM

## 2023-06-06 LAB — POC URINALSYSI DIPSTICK (AUTOMATED)
Bilirubin, UA: NEGATIVE
Blood, UA: POSITIVE
Glucose, UA: NEGATIVE
Ketones, UA: NEGATIVE
Leukocytes, UA: NEGATIVE
Nitrite, UA: NEGATIVE
Protein, UA: NEGATIVE
Spec Grav, UA: 1.01 (ref 1.010–1.025)
Urobilinogen, UA: 0.2 U/dL
pH, UA: 6 (ref 5.0–8.0)

## 2023-06-06 NOTE — Progress Notes (Signed)
 ACUTE VISIT Chief Complaint  Patient presents with   Pain   HPI: Connor Thomas is a 63 y.o. male with a PMHx significant for GERD, HLD, depression, and ADHD, who is here today complaining of pain and fatigue.   Patient complains of a painful "knot" in the right parieto-occipital area that he noticed around 4/26. He says the pain radiates down into his neck, shoulders, and down his arms.  He also has been having worsening back pain in the left posterior rib cage. He has been having this pain for many months but it is worse lately, and hurts when he breathes deeply.  No hx of trauma or unusual physical activity. Further complains of pain in his right>left shoulder and upper right-sided chest.   All of his pain has contributed to fatigue over the last month, because he is having significant difficulty sleeping due to his pain. He reports he is only sleeping 4-5 hours per night and wakes up frequently in pain.  Also notes his temperature has been elevated to 99 lately (normal for him is 98) and he has had some parietal occipital headaches. He describes the headaches as a dull pain and rates it as a 3/10.  He hasn't had a bowel movement for a few days.  He uses extra-strength tylenol  and ice for his pain, which works well temporarily. Rest also helps.  Most of his symptoms started around 4/24, shortly after he started taking metformin  [facility]. He has since stopped taking the metformin .  He states he went to urgent care a couple of days ago.  No hx of cancer.  Pertinent negatives include abdominal pain, gross hematuria, unusual numbness or tingling, abnormal weight loss, night sweats, cough, wheezing, or SOB.   Chronic urinary symptoms:  Patient also complains of urinary frequency and urgency since for a long time.  Pertinent negatives include burning sensation, discharge, abdominal pain, or hematuria.   Hyperlipidemia: Currently on  simvastatin  80 mg daily. He increased from 40 mg last year.  Side effects from medication: none Lab Results  Component Value Date   CHOL 171 12/31/2022   HDL 49 12/31/2022   LDLCALC 106 (H) 12/31/2022   LDLDIRECT 144.9 03/10/2008   TRIG 83 12/31/2022   CHOLHDL 4 07/08/2022   Review of Systems  Constitutional:  Positive for activity change. Negative for appetite change.  HENT:  Negative for facial swelling, mouth sores, sore throat and trouble swallowing.   Respiratory:  Negative for cough, shortness of breath and wheezing.   Gastrointestinal:  Negative for abdominal pain, nausea and vomiting.  Endocrine: Negative for cold intolerance and heat intolerance.  Genitourinary:  Negative for decreased urine volume, penile discharge, penile swelling and scrotal swelling.  Musculoskeletal:  Positive for arthralgias and myalgias. Negative for gait problem.  Skin:  Negative for rash.  Neurological:  Negative for syncope, facial asymmetry and weakness.  Psychiatric/Behavioral:  Negative for confusion and hallucinations. The patient is nervous/anxious.   See other pertinent positives and negatives in HPI.  Current Outpatient Medications on File Prior to Visit  Medication Sig Dispense Refill   Acetaminophen  (TYLENOL  PO) Take by mouth as needed.     acyclovir  (ZOVIRAX ) 200 MG capsule TAKE 1 CAPSULE BY MOUTH FIVE TIMES DAILY 100 capsule 3   albuterol  (PROVENTIL  HFA) 108 (90 Base) MCG/ACT inhaler Inhale 2 puffs into the lungs every 4 (four) hours as needed for wheezing or shortness of breath. 18 g 5   amphetamine -dextroamphetamine  (  ADDERALL XR) 10 MG 24 hr capsule Take 1 capsule (10 mg total) by mouth daily. (fill 12/30/22) 30 capsule 0   amphetamine -dextroamphetamine  (ADDERALL XR) 10 MG 24 hr capsule Take 1 capsule (10 mg total) by mouth daily. 30 capsule 0   amphetamine -dextroamphetamine  (ADDERALL XR) 10 MG 24 hr capsule Take 1 capsule (10 mg total) by mouth daily. 06/15/23 30 capsule 0    amphetamine -dextroamphetamine  (ADDERALL XR) 10 MG 24 hr capsule Take 1 capsule (10 mg total) by mouth daily. 07/16/23 30 capsule 0   Cholecalciferol (VITAMIN D3) 50 MCG (2000 UT) capsule 2,000 international UNITS VIT d3 DAILY     citalopram  (CELEXA ) 20 MG tablet Take 1 tablet (20 mg total) by mouth daily. 90 tablet 3   fluticasone  (FLONASE ) 50 MCG/ACT nasal spray Instill 1 spray into each nostril following sinus rinses twice daily 16 g 2   Ibuprofen (ADVIL PO) Take by mouth as needed.     levocetirizine (XYZAL ) 5 MG tablet Take 1 tablet (5 mg total) by mouth every evening. 90 tablet 0   meloxicam  (MOBIC ) 7.5 MG tablet Take 1 tablet (7.5 mg total) by mouth 2 (two) times daily for 21 days 42 tablet 1   metFORMIN  (GLUCOPHAGE -XR) 500 MG 24 hr tablet Take 2 tablets (1,000 mg total) by mouth daily with food 60 tablet 0   montelukast  (SINGULAIR ) 10 MG tablet Take 1 tablet (10 mg total) by mouth at bedtime. 90 tablet 0   omeprazole  (PRILOSEC) 20 MG capsule Take 1 capsule (20 mg total) by mouth daily. 90 capsule 3   simvastatin  (ZOCOR ) 80 MG tablet Take 1 tablet (80 mg total) by mouth daily. 90 tablet 3   Vitamin D , Ergocalciferol , (DRISDOL ) 1.25 MG (50000 UNIT) CAPS capsule Take 1 capsule (50,000 Units total) by mouth every 7 (seven) days for 28 days. 4 capsule 0   No current facility-administered medications on file prior to visit.    Past Medical History:  Diagnosis Date   Allergy    Anxiety    Arthritis    shoulders,ankles,lower back   Asthma    as a child   Back pain    Cancer (HCC)    melanoma   Depression    Edema    Ganglion of right wrist    GERD (gastroesophageal reflux disease)    Hernia, inguinal    Hip pain    Hyperlipidemia    Melanoma in situ of nose (HCC) 11/2012   Rotator cuff tear    LEFT   Shoulder pain    Allergies  Allergen Reactions   Other     Social History   Socioeconomic History   Marital status: Married    Spouse name: Not on file   Number of  children: Not on file   Years of education: Not on file   Highest education level: Not on file  Occupational History   Not on file  Tobacco Use   Smoking status: Former    Current packs/day: 0.00    Types: Cigarettes    Quit date: 01/08/2018    Years since quitting: 5.4    Passive exposure: Never   Smokeless tobacco: Never  Vaping Use   Vaping status: Never Used  Substance and Sexual Activity   Alcohol use: Not Currently    Alcohol/week: 12.0 standard drinks of alcohol    Types: 12 Cans of beer per week   Drug use: No   Sexual activity: Yes  Other Topics Concern   Not on file  Social History Narrative   Not on file   Social Drivers of Health   Financial Resource Strain: Patient Declined (06/04/2023)   Overall Financial Resource Strain (CARDIA)    Difficulty of Paying Living Expenses: Patient declined  Food Insecurity: No Food Insecurity (06/04/2023)   Hunger Vital Sign    Worried About Running Out of Food in the Last Year: Never true    Ran Out of Food in the Last Year: Never true  Transportation Needs: No Transportation Needs (06/04/2023)   PRAPARE - Administrator, Civil Service (Medical): No    Lack of Transportation (Non-Medical): No  Physical Activity: Insufficiently Active (06/04/2023)   Exercise Vital Sign    Days of Exercise per Week: 3 days    Minutes of Exercise per Session: 10 min  Stress: Stress Concern Present (06/04/2023)   [employer] of Occupational Health - Occupational Stress Questionnaire    Feeling of Stress : To some extent  Social Connections: Unknown (06/04/2023)   Social Connection and Isolation Panel [NHANES]    Frequency of Communication with Friends and Family: Three times a week    Frequency of Social Gatherings with Friends and Family: Once a week    Attends Religious Services: Patient declined    Database administrator or Organizations: Patient declined    Attends Banker Meetings: Not on file    Marital  Status: Married    Vitals:   06/06/23 1432  BP: 122/60  Pulse: 80  Resp: 16  Temp: 99.1 F (37.3 C)  SpO2: 96%   Wt Readings from Last 3 Encounters:  06/06/23 208 lb (94.3 kg)  05/14/23 206 lb (93.4 kg)  04/21/23 210 lb (95.3 kg)  Body mass index is 32.58 kg/m.  Physical Exam Vitals and nursing note reviewed.  Constitutional:      General: He is not in acute distress.    Appearance: He is well-developed.  HENT:     Head: Normocephalic and atraumatic.  Eyes:     Conjunctiva/sclera: Conjunctivae normal.  Cardiovascular:     Rate and Rhythm: Normal rate and regular rhythm.     Heart sounds: No murmur heard. Pulmonary:     Effort: Pulmonary effort is normal. No respiratory distress.     Breath sounds: Normal breath sounds.  Abdominal:     Palpations: Abdomen is soft. There is no hepatomegaly or mass.     Tenderness: There is no abdominal tenderness. There is no right CVA tenderness or left CVA tenderness.  Musculoskeletal:     Right shoulder: Normal range of motion.     Left shoulder: Normal range of motion.     Cervical back: Neck supple. Tenderness present. No edema or erythema. Spinous process tenderness present.     Thoracic back: Tenderness present.       Back:     Right lower leg: No edema.     Left lower leg: No edema.     Comments: Tender points in the lower occipital scalp, bilateral cervical paraspinal muscles, trapezius , and posterior left ribcage. Shoulder range of motion exacerbates upper back and upper extremity pain. No signs of synovitis.  Lymphadenopathy:     Cervical: No cervical adenopathy.  Skin:    General: Skin is warm.     Findings: No erythema or rash.  Neurological:     General: No focal deficit present.     Mental Status: He is alert and oriented to person, place, and time.  Cranial Nerves: No cranial nerve deficit.     Gait: Gait normal.  Psychiatric:        Mood and Affect: Affect normal. Mood is anxious.   ASSESSMENT AND  PLAN:  Mr. Henneman was seen today for pain and fatigue.  Lab Results  Component Value Date   CRP 65 (H) 06/06/2023   Lab Results  Component Value Date   ESRSEDRATE 40 (H) 06/06/2023   Lab Results  Component Value Date   WBC 11.4 (H) 06/06/2023   HGB 12.8 (L) 06/06/2023   HCT 39.1 06/06/2023   MCV 89 06/06/2023   PLT 444 06/06/2023   Lab Results  Component Value Date   CKTOTAL 52 06/06/2023   Lab Results  Component Value Date   NA 138 06/06/2023   CL 98 06/06/2023   K 4.5 06/06/2023   CO2 25 06/06/2023   BUN 17 06/06/2023   CREATININE 0.89 06/06/2023   EGFR 96 06/06/2023   CALCIUM 9.2 06/06/2023   ALBUMIN 4.5 05/14/2023   GLUCOSE 85 06/06/2023   Fatigue, unspecified type We discussed possible etiologies: Systemic illness, immunologic,endocrinology,sleep disorder, psychiatric/psychologic, infectious,medications side effects, and idiopathic. Symptom has been going on for a few weeks  and examination today does not suggest a serious process. He reports that he has not been sleeping well for the past few weeks, around 4 hours, when he used to sleep 8 hours. Recommend good sleep hygiene, he could try OTC melatonin 2 hours before bedtime and with empty stomach.  Further recommendations will be given according to lab results.  -     Basic metabolic panel with GFR; Future -     C-reactive protein; Future -     CBC with Differential/Platelet; Future  Urinary frequency When he arranged appt, it was my understanding he was having acute urinary symptoms but today he denies urinary changes. He reports that urinary frequency has been going on for a while and is stable, he denies dysuria or gross hematuria.  Urine dipstick today positive for blood, otherwise negative.  He has history of microscopic hematuria. Further recommendation will be given according to urine microscopic exam. Last PSA was 0.4 in 06/2022.  -     POCT Urinalysis Dipstick (Automated) -     Urine Microscopic;  Future  Myalgia States that problem has been intermittent for a while. Proceeded by right-sided parieto-occipital headache and radiated to neck, shoulders, and proximal arms pain for the past 2 to 3 weeks.We discussed possible etiologies. PMR is to be considered. Recommend decreasing dose of statin. No signs of synovitis on examination today. Recommend monitoring for new symptoms. He has been prescribed meloxicam  7.5 mg, recommend trying once daily.  Tylenol  also seems to help, recommend 500 mg 3-4 times per day. Follow-up with PCP in 2 weeks.  -     Basic metabolic panel with GFR; Future -     C-reactive protein; Future -     Sedimentation rate; Future -     CK; Future -     CBC with Differential/Platelet; Future  Hyperlipidemia, unspecified hyperlipidemia type Simvastatin  could be contributing to myalgias, recommend decreasing dose from 80 mg to 40 mg. Continue following with PCP.  Constipation, unspecified constipation type  Denies associated abdominal pain. Recommend adequate fiber and fluid intake. MiraLAX daily as needed and bisacodyl 5 mg daily as needed. Last colonoscopy in 06/2021.  I spent a total of 43 minutes in both face to face and non face to face activities for this visit  on the date of this encounter. During this time history was obtained and documented, examination was performed, prior labs reviewed, and assessment/plan discussed.  Return in about 2 weeks (around 06/20/2023) for myalgias with PCP.  Connor Thomas, acting as a scribe for Connor Ayo [place], MD., have documented all relevant documentation on the behalf of Connor Lefeber [place], MD, as directed by  Connor Parson [place], MD while in the presence of Connor Depaul [place], MD.   I, Connor Wantz [place], MD, have reviewed all documentation for this visit. The documentation on 06/06/23 for the exam, diagnosis, procedures, and orders are all accurate and complete.  Connor Savoia G. [place], MD  [facility]. [place] office.

## 2023-06-06 NOTE — Patient Instructions (Addendum)
 A few things to remember from today's visit:  Dysuria - Plan: POCT Urinalysis Dipstick (Automated)  Fatigue, unspecified type - Plan: Basic metabolic panel with GFR, C-reactive protein, CBC with Differential/Platelet  Myalgia - Plan: Basic metabolic panel with GFR, C-reactive protein, Sedimentation rate, CK, CBC with Differential/Platelet  Microscopic hematuria - Plan: Urine Microscopic Only, CBC with Differential/Platelet  Continue Meloxicam  1-2 tabs daily as needed and Tylenol  500 mg 3-4 times for pain management. Monitor for new symptoms.  If you need refills for medications you take chronically, please call your pharmacy. Do not use My Chart to request refills or for acute issues that need immediate attention. If you send a my chart message, it may take a few days to be addressed, specially if I am not in the office.  Please be sure medication list is accurate. If a new problem present, please set up appointment sooner than planned today.

## 2023-06-07 ENCOUNTER — Telehealth: Payer: Self-pay | Admitting: Family Medicine

## 2023-06-07 ENCOUNTER — Ambulatory Visit: Payer: Self-pay | Admitting: Family Medicine

## 2023-06-07 DIAGNOSIS — M791 Myalgia, unspecified site: Secondary | ICD-10-CM

## 2023-06-07 LAB — BASIC METABOLIC PANEL WITH GFR
BUN/Creatinine Ratio: 19 (ref 10–24)
BUN: 17 mg/dL (ref 8–27)
CO2: 25 mmol/L (ref 20–29)
Calcium: 9.2 mg/dL (ref 8.6–10.2)
Chloride: 98 mmol/L (ref 96–106)
Creatinine, Ser: 0.89 mg/dL (ref 0.76–1.27)
Glucose: 85 mg/dL (ref 70–99)
Potassium: 4.5 mmol/L (ref 3.5–5.2)
Sodium: 138 mmol/L (ref 134–144)
eGFR: 96 mL/min/{1.73_m2} (ref >59)

## 2023-06-07 LAB — CBC WITH DIFFERENTIAL/PLATELET
Basophils Absolute: 0.1 10*3/uL (ref 0.0–0.2)
Basos: 1 %
EOS (ABSOLUTE): 0.4 10*3/uL (ref 0.0–0.4)
Eos: 3 %
Hematocrit: 39.1 % (ref 37.5–51.0)
Hemoglobin: 12.8 g/dL — ABNORMAL LOW (ref 13.0–17.7)
Immature Grans (Abs): 0 10*3/uL (ref 0.0–0.1)
Immature Granulocytes: 0 %
Lymphocytes Absolute: 2.8 10*3/uL (ref 0.7–3.1)
Lymphs: 24 %
MCH: 29.2 pg (ref 26.6–33.0)
MCHC: 32.7 g/dL (ref 31.5–35.7)
MCV: 89 fL (ref 79–97)
Monocytes Absolute: 1 10*3/uL — ABNORMAL HIGH (ref 0.1–0.9)
Monocytes: 9 %
Neutrophils Absolute: 7.1 10*3/uL — ABNORMAL HIGH (ref 1.4–7.0)
Neutrophils: 63 %
Platelets: 444 10*3/uL (ref 150–450)
RBC: 4.39 x10E6/uL (ref 4.14–5.80)
RDW: 11.7 % (ref 11.6–15.4)
WBC: 11.4 10*3/uL — ABNORMAL HIGH (ref 3.4–10.8)

## 2023-06-07 LAB — SEDIMENTATION RATE: Sed Rate: 40 mm/h — ABNORMAL HIGH (ref 0–30)

## 2023-06-07 LAB — C-REACTIVE PROTEIN: CRP: 65 mg/L — ABNORMAL HIGH (ref 0–10)

## 2023-06-07 LAB — CK: Total CK: 52 U/L (ref 41–331)

## 2023-06-07 MED ORDER — PREDNISONE 20 MG PO TABS: ORAL_TABLET | ORAL | 0 refills | Status: DC

## 2023-06-07 NOTE — Telephone Encounter (Signed)
 Chart and visit reviewed from yesterday with Dr. [place].  Appears symptoms have been present for a few weeks.  Neck pain with radiating arm pain, thoracic back pain, also other areas including shoulder upper chest, fatigue and myalgias.  Temp 99.1 at his visit yesterday. Plan for follow up with PCP in 2 weeks,   He noted his lab results this morning and concerned about the elevated sed rate and CPK, mild leukocytosis.  I reviewed his labs with normal BMP, WBC 11.4, slight elevated ANC of 7.1, hemoglobin 12.8, prior 14.2 in December.  Normal CK, slight elevated sed rate of 40.  Elevated CRP of 65.  Urinalysis negative except for positive blood, microscopic still pending.  3:59 PM Called patient. No answer.  Will try to reach him later today to discuss these labs, symptoms and plan.

## 2023-06-07 NOTE — Telephone Encounter (Signed)
 Call form answering service- saw his labs and concnerned about elevated CRP and WBC, low HGB. Neck, back, shoulder pain,  low grade fever last night 100.0.    Will review chart and plan and call patient.

## 2023-06-07 NOTE — Telephone Encounter (Signed)
 Called pt - he did speak to Dr. [place] earlier today and reviewed labs, questions. Was started on prednisone and plan for follow up next week for other testing. I apologized for calling him again - I did not see note from Dr. [place] earlier, but do now. He appreciated the call.

## 2023-06-07 NOTE — Telephone Encounter (Signed)
 Called Connor Thomas again - no answer. LMOM that I will try to reach him again at a later time.

## 2023-06-08 ENCOUNTER — Encounter: Payer: Self-pay | Admitting: Family Medicine

## 2023-06-08 LAB — URINALYSIS, MICROSCOPIC ONLY

## 2023-06-09 ENCOUNTER — Other Ambulatory Visit (INDEPENDENT_AMBULATORY_CARE_PROVIDER_SITE_OTHER)

## 2023-06-09 ENCOUNTER — Ambulatory Visit (INDEPENDENT_AMBULATORY_CARE_PROVIDER_SITE_OTHER): Admitting: Family Medicine

## 2023-06-09 DIAGNOSIS — R350 Frequency of micturition: Secondary | ICD-10-CM | POA: Diagnosis not present

## 2023-06-09 DIAGNOSIS — M791 Myalgia, unspecified site: Secondary | ICD-10-CM | POA: Diagnosis not present

## 2023-06-09 LAB — SEDIMENTATION RATE: Sed Rate: 47 mm/h — ABNORMAL HIGH (ref 0–20)

## 2023-06-09 LAB — CBC WITH DIFFERENTIAL/PLATELET
Basophils Absolute: 0.1 10*3/uL (ref 0.0–0.1)
Basophils Relative: 0.9 % (ref 0.0–3.0)
Eosinophils Absolute: 0.2 10*3/uL (ref 0.0–0.7)
Eosinophils Relative: 1.2 % (ref 0.0–5.0)
HCT: 35 % — ABNORMAL LOW (ref 39.0–52.0)
Hemoglobin: 11.8 g/dL — ABNORMAL LOW (ref 13.0–17.0)
Lymphocytes Relative: 28.5 % (ref 12.0–46.0)
Lymphs Abs: 4.3 10*3/uL — ABNORMAL HIGH (ref 0.7–4.0)
MCHC: 33.7 g/dL (ref 30.0–36.0)
MCV: 85.1 fl (ref 78.0–100.0)
Monocytes Absolute: 1.1 10*3/uL — ABNORMAL HIGH (ref 0.1–1.0)
Monocytes Relative: 7.6 % (ref 3.0–12.0)
Neutro Abs: 9.3 10*3/uL — ABNORMAL HIGH (ref 1.4–7.7)
Neutrophils Relative %: 61.8 % (ref 43.0–77.0)
Platelets: 461 10*3/uL — ABNORMAL HIGH (ref 150.0–400.0)
RBC: 4.11 Mil/uL — ABNORMAL LOW (ref 4.22–5.81)
RDW: 12.5 % (ref 11.5–15.5)
WBC: 15.1 10*3/uL — ABNORMAL HIGH (ref 4.0–10.5)

## 2023-06-09 LAB — C-REACTIVE PROTEIN: CRP: 2.5 mg/dL (ref 0.5–20.0)

## 2023-06-10 ENCOUNTER — Encounter (INDEPENDENT_AMBULATORY_CARE_PROVIDER_SITE_OTHER): Payer: Self-pay | Admitting: Family Medicine

## 2023-06-10 ENCOUNTER — Encounter: Payer: Self-pay | Admitting: Family Medicine

## 2023-06-10 ENCOUNTER — Ambulatory Visit (INDEPENDENT_AMBULATORY_CARE_PROVIDER_SITE_OTHER): Admitting: Family Medicine

## 2023-06-10 ENCOUNTER — Other Ambulatory Visit (HOSPITAL_COMMUNITY): Payer: Self-pay

## 2023-06-10 VITALS — BP 102/58 | HR 74 | Temp 98.7°F | Ht 67.0 in | Wt 203.0 lb

## 2023-06-10 DIAGNOSIS — Z6832 Body mass index (BMI) 32.0-32.9, adult: Secondary | ICD-10-CM

## 2023-06-10 DIAGNOSIS — E669 Obesity, unspecified: Secondary | ICD-10-CM

## 2023-06-10 DIAGNOSIS — R7303 Prediabetes: Secondary | ICD-10-CM | POA: Diagnosis not present

## 2023-06-10 DIAGNOSIS — E538 Deficiency of other specified B group vitamins: Secondary | ICD-10-CM

## 2023-06-10 DIAGNOSIS — Z6831 Body mass index (BMI) 31.0-31.9, adult: Secondary | ICD-10-CM

## 2023-06-10 DIAGNOSIS — E559 Vitamin D deficiency, unspecified: Secondary | ICD-10-CM | POA: Diagnosis not present

## 2023-06-10 DIAGNOSIS — E66811 Obesity, class 1: Secondary | ICD-10-CM

## 2023-06-10 LAB — PSA: PSA: 0.47 ng/mL (ref 0.10–4.00)

## 2023-06-10 MED ORDER — VITAMIN D (ERGOCALCIFEROL) 1.25 MG (50000 UNIT) PO CAPS
50000.0000 [IU] | ORAL_CAPSULE | ORAL | 1 refills | Status: AC
Start: 2023-06-10 — End: 2023-08-05
  Filled 2023-06-10: qty 4, 28d supply, fill #0

## 2023-06-10 MED ORDER — CYANOCOBALAMIN 500 MCG PO TABS: 500.0000 ug | ORAL_TABLET | Freq: Every day | ORAL | Status: DC

## 2023-06-10 MED ORDER — METFORMIN HCL ER 500 MG PO TB24
1000.0000 mg | ORAL_TABLET | Freq: Every day | ORAL | 1 refills | Status: DC
  Filled 2023-06-10: qty 60, 30d supply, fill #0

## 2023-06-10 NOTE — Progress Notes (Signed)
 Connor Thomas, D.O.  ABFM, ABOM Specializing in Clinical Bariatric Medicine  Office located at: [address] [place], [place]  [address]   Assessment and Plan:   Medications Discontinued During This Encounter  Medication Reason   Vitamin D , Ergocalciferol , (DRISDOL ) 1.25 MG (50000 UNIT) CAPS capsule Reorder   metFORMIN  (GLUCOPHAGE -XR) 500 MG 24 hr tablet Reorder     Meds ordered this encounter  Medications   Vitamin D , Ergocalciferol , (DRISDOL ) 1.25 MG (50000 UNIT) CAPS capsule    Sig: Take 1 capsule (50,000 Units total) by mouth every 7 (seven) days.    Dispense:  4 capsule    Refill:  1   metFORMIN  (GLUCOPHAGE -XR) 500 MG 24 hr tablet    Sig: Take 2 tablets (1,000 mg total) by mouth daily with food    Dispense:  60 tablet    Refill:  1   cyanocobalamin  (VITAMIN B12) 500 MCG tablet    Sig: Take 1 tablet (500 mcg total) by mouth daily.    FOR THE DISEASE OF OBESITY:  BMI 32.0-32.9,adult -- Curent BMI 31.79 Obesity (BMI 30.0-34.9) -starting bmi 12/31/22 32.88 Assessment & Plan: Since last office visit on 05/14/2023 patient's  Muscle mass has increased by 1.8 lb. Fat mass has decreased by 5 lb. Total body water has decreased by 0.8 lb.  Counseling done on how various foods will affect these numbers and how to maximize success  Total lbs lost to date: 7 lbs Total weight loss percentage to date: 3.33%    Recommended Dietary Goals Connor Thomas is currently in the action stage of change. As such, his goal is to continue weight management plan.  He has agreed to: continue current plan   Behavioral Intervention We discussed the following today: continue to work on maintaining a reduced calorie state, getting the recommended amount of protein, incorporating whole foods, making healthy choices, staying well hydrated and practicing mindfulness when eating.  Additional resources provided today: None  Evidence-based interventions for health behavior change were utilized today  including the discussion of self monitoring techniques, problem-solving barriers and SMART goal setting techniques.   Regarding patient's less desirable eating habits and patterns, we employed the technique of small changes.   Pt will specifically work on: n/a    Recommended Physical Activity Goals Connor Thomas has been advised to work up to 300-450 minutes of moderate intensity aerobic activity a week and strengthening exercises 2-3 times per week for cardiovascular health, weight loss maintenance and preservation of muscle mass.   He has agreed to Think about enjoyable ways to increase daily physical activity and overcoming barriers to exercise   Pharmacotherapy See Pre-DM note.    ASSOCIATED CONDITIONS ADDRESSED TODAY:  Prediabetes Assessment & Plan: Most recent labs: Lab Results  Component Value Date   HGBA1C 5.6 05/14/2023   HGBA1C 5.8 (H) 12/31/2022   HGBA1C 5.7 07/08/2022   Lab Results  Component Value Date   CREATININE 0.89 06/06/2023   BUN 17 06/06/2023   NA 138 06/06/2023   K 4.5 06/06/2023   CL 98 06/06/2023   CO2 25 06/06/2023      Component Value Date/Time   PROT 6.9 05/14/2023 0952   ALBUMIN 4.5 05/14/2023 0952   AST 14 05/14/2023 0952   ALT 14 05/14/2023 0952   ALKPHOS 94 05/14/2023 0952   BILITOT 0.3 05/14/2023 0952   BILIDIR 0.1 07/08/2022 0838   IBILI 0.6 12/14/2019 1010   Reviewed labs above: Hemoglobin A1c improved from 5.8 to 5.6. Recent renal and  liver parameters are within acceptable ranges. Magnesium levels normal.   Pt is being treated for possible PMR and was put on a steroid dose pack which has led to increased hunger and cravings. Pt also decided to stop his Metformin  XR 500 mg twice daily roughly 1.5 weeks ago.   Recommend pt restart his Metformin  and gradually up-titrate to the prescribed dose. Continue working on nutrition plan to decrease simple carbohydrates, increase lean proteins and exercise to promote weight loss. Continue treatment of  possible PMR per PCP.    B12 insufficieny - new onset Assessment & Plan: Most recent B12:  Lab Results  Component Value Date   VITAMINB12 391 05/14/2023   B12 level is 391, not at goal of over 500.  This diagnosis was reviewed with the patient and education was provided. Pt has been instructed to INITIATE OTC B12 500 mcg daily. Continue nutrient rich diet.    Vitamin D  insufficiency Assessment & Plan: Most recent VD: Lab Results  Component Value Date   VD25OH 61.8 05/14/2023   VD25OH 39.7 12/31/2022   Pt reports good compliance and tolerance of ergocalciferol  50,000 units wkly.  His VD levels are at goal. Maintain with current regimen. Recheck periodically   Follow up:   Return 07/07/2023 at 11:00 AM. He was informed of the importance of frequent follow up visits to maximize his success with intensive lifestyle modifications for his multiple health conditions.  Subjective:   Chief complaint: Obesity Connor Thomas is here to discuss his progress with his obesity treatment plan.  He is on the Category 3 Plan with B/L options and given the option to follow Low Carb MP and states he is following his eating plan approximately 25% of the time. He states he is not exercising.  Interval History:  Connor Thomas is here for a follow up office visit. Pt had been experiencing multiple joint pain including in the back, neck, and shoulders. He was worked up by his PCP and was thought to have PMR. He was put on a steroid dose pack which has essentially resolved his symptoms. The steroid dose pack has led to increased hunger and cravings but pt also stopped his Metformin  1.5 weeks ago. He plans to f/up with his PCP and see a rheumatologist.  Weight wise, pt is down 3 lbs. States he is eating out less and is more protein focused.   Review of Systems:  Pertinent positives were addressed with patient today.  Reviewed by clinician on day of visit: allergies, medications, problem list, medical history,  surgical history, family history, social history, and previous encounter notes.  Weight Summary and Biometrics   Weight Lost Since Last Visit: 3lb  Weight Gained Since Last Visit: 0lb   Vitals Temp: 98.7 F (37.1 C) BP: (!) 102/58 Pulse Rate: 74 SpO2: 98 %   Anthropometric Measurements Height: 5\' 7"  (1.702 m) Weight: 203 lb (92.1 kg) BMI (Calculated): 31.79 Weight at Last Visit: 208lb Weight Lost Since Last Visit: 3lb Weight Gained Since Last Visit: 0lb Starting Weight: 210lb Total Weight Loss (lbs): 7 lb (3.175 kg) Peak Weight: 264lb   Body Composition  Body Fat %: 26.1 % Fat Mass (lbs): 53 lbs Muscle Mass (lbs): 143 lbs Total Body Water (lbs): 98.4 lbs Visceral Fat Rating : 15   Other Clinical Data Fasting: No Labs: No Today's Visit #: 6 Starting Date: 12/31/22   Objective:   PHYSICAL EXAM: Blood pressure (!) 102/58, pulse 74, temperature 98.7 F (37.1 C), height 5\' 7"  (1.702  m), weight 203 lb (92.1 kg), SpO2 98%. Body mass index is 31.79 kg/m.  General: he is overweight, cooperative and in no acute distress. PSYCH: Has normal mood, affect and thought process.   HEENT: EOMI, sclerae are anicteric. Lungs: Normal breathing effort, no conversational dyspnea. Extremities: Moves * 4 Neurologic: A and O * 3, good insight  DIAGNOSTIC DATA REVIEWED: BMET    Component Value Date/Time   NA 138 06/06/2023 1525   K 4.5 06/06/2023 1525   CL 98 06/06/2023 1525   CO2 25 06/06/2023 1525   GLUCOSE 85 06/06/2023 1525   GLUCOSE 106 (H) 07/08/2022 0838   GLUCOSE 93 01/27/2006 0917   BUN 17 06/06/2023 1525   CREATININE 0.89 06/06/2023 1525   CREATININE 0.91 12/14/2019 1010   CALCIUM 9.2 06/06/2023 1525   GFRNONAA 92 12/14/2019 1010   GFRAA 107 12/14/2019 1010   Lab Results  Component Value Date   HGBA1C 5.6 05/14/2023   HGBA1C 5.7 10/17/2014   Lab Results  Component Value Date   INSULIN  7.8 12/31/2022   Lab Results  Component Value Date   TSH 1.670  12/31/2022   CBC    Component Value Date/Time   WBC 11.4 (H) 06/06/2023 1525   WBC 6.6 07/08/2022 0838   RBC 4.39 06/06/2023 1525   RBC 4.60 07/08/2022 0838   HGB 12.8 (L) 06/06/2023 1525   HCT 39.1 06/06/2023 1525   PLT 444 06/06/2023 1525   MCV 89 06/06/2023 1525   MCH 29.2 06/06/2023 1525   MCH 30.7 12/14/2019 1010   MCHC 32.7 06/06/2023 1525   MCHC 34.2 07/08/2022 0838   RDW 11.7 06/06/2023 1525   Iron Studies No results found for: "IRON", "TIBC", "FERRITIN", "IRONPCTSAT" Lipid Panel     Component Value Date/Time   CHOL 171 12/31/2022 0936   TRIG 83 12/31/2022 0936   TRIG 119 01/27/2006 0917   HDL 49 12/31/2022 0936   CHOLHDL 4 07/08/2022 0838   VLDL 18.4 07/08/2022 0838   LDLCALC 106 (H) 12/31/2022 0936   LDLCALC 103 (H) 12/14/2019 1010   LDLDIRECT 144.9 03/10/2008 0812   Hepatic Function Panel     Component Value Date/Time   PROT 6.9 05/14/2023 0952   ALBUMIN 4.5 05/14/2023 0952   AST 14 05/14/2023 0952   ALT 14 05/14/2023 0952   ALKPHOS 94 05/14/2023 0952   BILITOT 0.3 05/14/2023 0952   BILIDIR 0.1 07/08/2022 0838   IBILI 0.6 12/14/2019 1010      Component Value Date/Time   TSH 1.670 12/31/2022 0936   Nutritional Lab Results  Component Value Date   VD25OH 61.8 05/14/2023   VD25OH 39.7 12/31/2022    Attestations:   I, Special Puri , acting as a Stage manager for [employer], DO., have compiled all relevant documentation for today's office visit on behalf of Marceil Sensor, DO, while in the presence of [employer], DO.  I have reviewed the above documentation for accuracy and completeness, and I agree with the above. Connor Thomas, D.O.  The 21st Century Cures Act was signed into law in 2016 which includes the topic of electronic health records.  This provides immediate access to information in MyChart.  This includes consultation notes, operative notes, office notes, lab results and pathology reports.  If you have any questions about  what you read please let us  know at your next visit so we can discuss your concerns and take corrective action if need be.  We are right here with you.

## 2023-06-11 ENCOUNTER — Ambulatory Visit (HOSPITAL_COMMUNITY)
Admission: RE | Admit: 2023-06-11 | Discharge: 2023-06-11 | Disposition: A | Discharge status: Home / Self Care | Source: Ambulatory Visit | Attending: Family Medicine | Admitting: Family Medicine

## 2023-06-11 ENCOUNTER — Encounter: Payer: Self-pay | Admitting: Family Medicine

## 2023-06-11 DIAGNOSIS — R519 Headache, unspecified: Secondary | ICD-10-CM | POA: Diagnosis present

## 2023-06-12 NOTE — Telephone Encounter (Signed)
 I reviewed the lab results and the scan results, and I don't see anything alarming. He should be fine to wait until his OV with us  next week.

## 2023-06-17 NOTE — Telephone Encounter (Signed)
 The Prednisone  should not affect the procedure to remove the cyst. As for the appt with me this Friday, the nurses have tried to contact him to reschedule this since I will be out of the office that day

## 2023-06-17 NOTE — Telephone Encounter (Signed)
 FYI Spoke with pt advised of Dr Alyne Babinski message, pt appointment for Friday 06/20/23 was cancel per PCP not in the office, pt was offered to reschedule appointment with Dr Alyne Babinski when he comes back to the office but declined stated that he would call to schedule a F/U appointment with Dr [place] since she will be available and is aware of his condition. Stated that he will call the office for Friday app with Dr [place]and since he also need to have repeat blood work done .

## 2023-06-18 ENCOUNTER — Telehealth: Payer: Self-pay

## 2023-06-18 ENCOUNTER — Other Ambulatory Visit (INDEPENDENT_AMBULATORY_CARE_PROVIDER_SITE_OTHER)

## 2023-06-18 ENCOUNTER — Ambulatory Visit: Payer: Self-pay | Admitting: Family Medicine

## 2023-06-18 ENCOUNTER — Other Ambulatory Visit (HOSPITAL_COMMUNITY): Payer: Self-pay

## 2023-06-18 DIAGNOSIS — M791 Myalgia, unspecified site: Secondary | ICD-10-CM | POA: Diagnosis not present

## 2023-06-18 LAB — CBC WITH DIFFERENTIAL/PLATELET
Basophils Absolute: 0 10*3/uL (ref 0.0–0.1)
Basophils Relative: 0.2 % (ref 0.0–3.0)
Eosinophils Absolute: 0.1 10*3/uL (ref 0.0–0.7)
Eosinophils Relative: 0.4 % (ref 0.0–5.0)
HCT: 36.9 % — ABNORMAL LOW (ref 39.0–52.0)
Hemoglobin: 12.5 g/dL — ABNORMAL LOW (ref 13.0–17.0)
Lymphocytes Relative: 13.3 % (ref 12.0–46.0)
Lymphs Abs: 1.9 10*3/uL (ref 0.7–4.0)
MCHC: 33.8 g/dL (ref 30.0–36.0)
MCV: 85.2 fl (ref 78.0–100.0)
Monocytes Absolute: 0.4 10*3/uL (ref 0.1–1.0)
Monocytes Relative: 3 % (ref 3.0–12.0)
Neutro Abs: 12.1 10*3/uL — ABNORMAL HIGH (ref 1.4–7.7)
Neutrophils Relative %: 83.1 % — ABNORMAL HIGH (ref 43.0–77.0)
Platelets: 444 10*3/uL — ABNORMAL HIGH (ref 150.0–400.0)
RBC: 4.33 Mil/uL (ref 4.22–5.81)
RDW: 12.9 % (ref 11.5–15.5)
WBC: 14.5 10*3/uL — ABNORMAL HIGH (ref 4.0–10.5)

## 2023-06-18 LAB — C-REACTIVE PROTEIN: CRP: 2.5 mg/dL (ref 0.5–20.0)

## 2023-06-18 LAB — SEDIMENTATION RATE: Sed Rate: 42 mm/h — ABNORMAL HIGH (ref 0–20)

## 2023-06-18 MED ORDER — PREDNISONE 10 MG PO TABS
5.0000 mg | ORAL_TABLET | Freq: Every day | ORAL | 0 refills | Status: DC
  Filled 2023-06-18: qty 30, 30d supply, fill #0

## 2023-06-18 NOTE — Telephone Encounter (Signed)
 See mychart encounter from yesterday, lab work was already done today.

## 2023-06-18 NOTE — Telephone Encounter (Signed)
 Pt request sent to Dr Sw[place]and CMA

## 2023-06-19 ENCOUNTER — Other Ambulatory Visit: Payer: Self-pay

## 2023-06-19 ENCOUNTER — Other Ambulatory Visit (HOSPITAL_COMMUNITY): Payer: Self-pay

## 2023-06-19 ENCOUNTER — Encounter: Payer: Self-pay | Admitting: Family Medicine

## 2023-06-19 ENCOUNTER — Other Ambulatory Visit: Payer: Self-pay | Admitting: Family Medicine

## 2023-06-19 MED ORDER — DOXYCYCLINE HYCLATE 100 MG PO TABS
100.0000 mg | ORAL_TABLET | Freq: Two times a day (BID) | ORAL | 0 refills | Status: AC
  Filled 2023-06-19: qty 20, 10d supply, fill #0

## 2023-06-19 NOTE — Telephone Encounter (Signed)
 Pt notified about Dr Alyne Babinski recommendation, lab appointment scheduled for 06/20/23 and Doxycycline  Rx sent to pt pharmacy

## 2023-06-19 NOTE — Telephone Encounter (Signed)
 Certainly Lyme disease is a possibility. I have ordered a test for Lyme antibodies, so he can make a lab appt here. Call in Doxycycline  100 mg BID for 10 days

## 2023-06-20 ENCOUNTER — Ambulatory Visit: Admitting: Family Medicine

## 2023-06-20 ENCOUNTER — Other Ambulatory Visit

## 2023-06-20 DIAGNOSIS — M791 Myalgia, unspecified site: Secondary | ICD-10-CM

## 2023-06-21 LAB — B. BURGDORFI ANTIBODIES: B burgdorferi Ab IgG+IgM: <0.9 {index}

## 2023-06-22 ENCOUNTER — Encounter: Payer: Self-pay | Admitting: Family Medicine

## 2023-06-23 ENCOUNTER — Other Ambulatory Visit (HOSPITAL_COMMUNITY): Payer: Self-pay

## 2023-06-23 ENCOUNTER — Ambulatory Visit: Payer: Self-pay | Admitting: Family Medicine

## 2023-06-23 DIAGNOSIS — M791 Myalgia, unspecified site: Secondary | ICD-10-CM

## 2023-06-23 MED ORDER — AMPHETAMINE-DEXTROAMPHET ER 10 MG PO CP24
10.0000 mg | ORAL_CAPSULE | Freq: Every day | ORAL | 0 refills | Status: DC
  Filled 2023-06-23: qty 30, 30d supply, fill #0

## 2023-06-23 NOTE — Telephone Encounter (Signed)
 Done

## 2023-06-23 NOTE — Telephone Encounter (Signed)
 See my answer to his message dated 06-23-23

## 2023-06-23 NOTE — Telephone Encounter (Signed)
 His test for Lyme disease was negative. Tell him to continue the Prednisone  taper we had discussed. I have also referred him to Rheumatology.

## 2023-06-24 ENCOUNTER — Other Ambulatory Visit (HOSPITAL_COMMUNITY): Payer: Self-pay

## 2023-06-24 NOTE — Telephone Encounter (Signed)
 Please check into this referral for Dr Alyne Babinski

## 2023-06-25 ENCOUNTER — Encounter (INDEPENDENT_AMBULATORY_CARE_PROVIDER_SITE_OTHER): Payer: Self-pay | Admitting: Family Medicine

## 2023-06-26 ENCOUNTER — Encounter: Payer: Self-pay | Admitting: Family Medicine

## 2023-06-26 NOTE — Telephone Encounter (Signed)
 Send a message to the office referral coordinator, awaiting her response

## 2023-06-27 ENCOUNTER — Encounter: Payer: Self-pay | Admitting: Family Medicine

## 2023-06-27 DIAGNOSIS — M2550 Pain in unspecified joint: Secondary | ICD-10-CM

## 2023-06-30 ENCOUNTER — Encounter: Payer: Self-pay | Admitting: Family Medicine

## 2023-07-01 NOTE — Telephone Encounter (Signed)
 I understand.

## 2023-07-02 ENCOUNTER — Telehealth: Payer: Self-pay

## 2023-07-02 NOTE — Telephone Encounter (Signed)
 This encounter was sent to PCP as an FYI

## 2023-07-03 NOTE — Telephone Encounter (Signed)
 I did the referral

## 2023-07-07 ENCOUNTER — Other Ambulatory Visit (HOSPITAL_COMMUNITY): Payer: Self-pay

## 2023-07-07 ENCOUNTER — Ambulatory Visit (INDEPENDENT_AMBULATORY_CARE_PROVIDER_SITE_OTHER): Admitting: Family Medicine

## 2023-07-07 ENCOUNTER — Encounter (INDEPENDENT_AMBULATORY_CARE_PROVIDER_SITE_OTHER): Payer: Self-pay | Admitting: Family Medicine

## 2023-07-07 VITALS — BP 99/65 | HR 77 | Temp 98.8°F | Ht 67.0 in | Wt 199.0 lb

## 2023-07-07 DIAGNOSIS — R7303 Prediabetes: Secondary | ICD-10-CM | POA: Diagnosis not present

## 2023-07-07 DIAGNOSIS — E538 Deficiency of other specified B group vitamins: Secondary | ICD-10-CM

## 2023-07-07 DIAGNOSIS — E669 Obesity, unspecified: Secondary | ICD-10-CM | POA: Diagnosis not present

## 2023-07-07 DIAGNOSIS — E559 Vitamin D deficiency, unspecified: Secondary | ICD-10-CM | POA: Diagnosis not present

## 2023-07-07 DIAGNOSIS — E66811 Obesity, class 1: Secondary | ICD-10-CM

## 2023-07-07 DIAGNOSIS — Z6831 Body mass index (BMI) 31.0-31.9, adult: Secondary | ICD-10-CM

## 2023-07-07 MED ORDER — VITAMIN D (ERGOCALCIFEROL) 1.25 MG (50000 UNIT) PO CAPS
50000.0000 [IU] | ORAL_CAPSULE | ORAL | 1 refills | Status: DC
  Filled 2023-07-07: qty 4, 28d supply, fill #0
  Filled 2023-07-27: qty 4, 28d supply, fill #1

## 2023-07-07 MED ORDER — METFORMIN HCL ER 500 MG PO TB24
1000.0000 mg | ORAL_TABLET | Freq: Every day | ORAL | 1 refills | Status: DC
  Filled 2023-07-07: qty 60, 30d supply, fill #0

## 2023-07-07 MED ORDER — CYANOCOBALAMIN 500 MCG PO TABS
500.0000 ug | ORAL_TABLET | Freq: Every day | ORAL | Status: DC
Start: 2023-07-07 — End: 2023-11-03

## 2023-07-07 NOTE — Progress Notes (Signed)
 Connor Thomas, D.O.  ABFM, ABOM Specializing in Clinical Bariatric Medicine  Office located at: [address] [place], [place]  [address]   Assessment and Plan:  No orders of the defined types were placed in this encounter.   Medications Discontinued During This Encounter  Medication Reason   Vitamin D , Ergocalciferol , (DRISDOL ) 1.25 MG (50000 UNIT) CAPS capsule Reorder   metFORMIN  (GLUCOPHAGE -XR) 500 MG 24 hr tablet Reorder   cyanocobalamin  (VITAMIN B12) 500 MCG tablet Reorder     Meds ordered this encounter  Medications   Vitamin D , Ergocalciferol , (DRISDOL ) 1.25 MG (50000 UNIT) CAPS capsule    Sig: Take 1 capsule (50,000 Units total) by mouth every 7 (seven) days.    Dispense:  4 capsule    Refill:  1   metFORMIN  (GLUCOPHAGE -XR) 500 MG 24 hr tablet    Sig: Take 2 tablets (1,000 mg total) by mouth daily with food    Dispense:  60 tablet    Refill:  1   cyanocobalamin  (VITAMIN B12) 500 MCG tablet    Sig: Take 1 tablet (500 mcg total) by mouth daily.      FOR THE DISEASE OF OBESITY: Obesity (BMI 30.0-34.9) -starting bmi 12/31/22 32.88 BMI 31.0-31.9,adult -- Current BMI 31.16 Assessment & Plan: Since last office visit on 06/10/23 patient's muscle mass has decreased by 2 lbs. Fat mass has decreased by 2 lbs. Total body water has decreased by 1 lb.  Counseling done on how various foods will affect these numbers and how to maximize success  Total lbs lost to date: 11 lbs Total weight loss percentage to date: -5.24%    Recommended Dietary Goals Connor Thomas is currently in the action stage of change. As such, his goal is to continue weight management plan.  He has agreed to: continue current plan   Behavioral Intervention We discussed the following today: continue to work on maintaining a reduced calorie state, getting the recommended amount of protein, incorporating whole foods, making healthy choices, staying well hydrated and practicing mindfulness when  eating.  Additional resources provided today: None  Evidence-based interventions for health behavior change were utilized today including the discussion of self monitoring techniques, problem-solving barriers and SMART goal setting techniques.   Regarding patient's less desirable eating habits and patterns, we employed the technique of small changes.   Pt will specifically work on: n/a   Recommended Physical Activity Goals Connor Thomas has been advised to work up to 300-450 minutes of moderate intensity aerobic activity a week and strengthening exercises 2-3 times per week for cardiovascular health, weight loss maintenance and preservation of muscle mass.   He has agreed to : Increase physical activity in their day and reduce sedentary time (increase NEAT).   Pharmacotherapy We both agreed to: Continue with current nutritional and behavioral strategies   ASSOCIATED CONDITIONS ADDRESSED TODAY: Vitamin D  insufficiency Assessment & Plan: Lab Results  Component Value Date   VD25OH 61.8 05/14/2023   VD25OH 39.7 12/31/2022   Most recent Vit D was at goal. Compliant with ERGO 50K units once weekly. Tolerating well with no adverse SE. No acute concerns today. Continue with current supplementation regimen. Will refill ERGO today.   Orders: - Refill ERGO, no dose changes   Prediabetes Assessment & Plan: Lab Results  Component Value Date   HGBA1C 5.6 05/14/2023   HGBA1C 5.8 (H) 12/31/2022   HGBA1C 5.7 07/08/2022   INSULIN  7.8 12/31/2022    Compliant with Metformin  1,000 mg once daily. Good tolerance with no  SE. Pt is in the process of changing PCPs. He was seen by Dr. Sw[place]and on 06/18/23 for a PMR flare up and was started on a Prednisone  taper. He is currently taking 5 mg in the morning and 5 mg in the evening. Despite taking Prednisone , he has successfully lost weight, which he believes is due to Metformin  helping to counteract the weight-promoting effects of the steroid. Continue with  metformin  and prednisone  as prescribed. Will refill metformin  today with no dose changes. Encouraged patient to continue best efforts with meal plan. Will continue monitoring.  Orders: - Refill Metformin , no changes   B12 insufficieny - new onset Assessment & Plan: Lab Results  Component Value Date   VITAMINB12 391 05/14/2023   VITAMINB12 495 12/31/2022   VITAMINB12 292 04/16/2021   Most recent Vit B12 was below goal at 391 about 2 months ago. Compliant with B12 500 mcg once daily. Tolerating well with no adverse SE. No acute concerns today. Continue with current supplementation regimen. Will refill B12  today.   Orders: - Refill B12, no dose changes   Follow up:   Return in about 5 weeks (around 08/11/2023) for Keep upcoming appt 7/21, make another appt if pt desires. Connor Thomas He was informed of the importance of frequent follow up visits to maximize his success with intensive lifestyle modifications for his multiple health conditions.  Subjective:   Chief complaint: Obesity Connor Thomas is here to discuss his progress with his obesity treatment plan. He is on the Category 3 Plan with B/L options and given the option to follow Low Carb MP and states he is following his eating plan approximately 100% of the time. He states he is not exercising.  Interval History:  Connor Thomas is here for a follow up office visit. Since last OV on 06/10/23, he os down 4 lbs. He has noticed his clothes are fitting better and notes he is on the last notch of the belt he is wearing anymore. Hunger is uncontrolled. Has been eating 3-5 cubes of watermelon and not measuring. Pt feels he is ready to start exercising, but will need to do so gradually to avoid any flare ups.   Pharmacotherapy for weight loss: He is currently taking Metformin  (off label use for incretin effect and / or insulin  resistance and / or diabetes prevention) with adequate clinical response  and without side effects..   Review of Systems:  Pertinent  positives were addressed with patient today.  Reviewed by clinician on day of visit: allergies, medications, problem list, medical history, surgical history, family history, social history, and previous encounter notes.  Weight Summary and Biometrics   Weight Lost Since Last Visit: 4lb  Weight Gained Since Last Visit: 0lb    Vitals Temp: 98.8 F (37.1 C) BP: 99/65 Pulse Rate: 77 SpO2: 98 %   Anthropometric Measurements Height: 5' 7 (1.702 m) Weight: 199 lb (90.3 kg) BMI (Calculated): 31.16 Weight at Last Visit: 203lb Weight Lost Since Last Visit: 4lb Weight Gained Since Last Visit: 0lb Starting Weight: 210lb Total Weight Loss (lbs): 11 lb (4.99 kg) Peak Weight: 264lb   Body Composition  Body Fat %: 25.6 % Fat Mass (lbs): 51 lbs Muscle Mass (lbs): 141 lbs Total Body Water (lbs): 97.4 lbs Visceral Fat Rating : 15   Other Clinical Data Fasting: Yesa Labs: No Today's Visit #: 7 Starting Date: 12/31/22    Objective:   PHYSICAL EXAM: Blood pressure 99/65, pulse 77, temperature 98.8 F (37.1 C), height 5' 7 (1.702  m), weight 199 lb (90.3 kg), SpO2 98%. Body mass index is 31.17 kg/m.  General: he is overweight, cooperative and in no acute distress. PSYCH: Has normal mood, affect and thought process.   HEENT: EOMI, sclerae are anicteric. Lungs: Normal breathing effort, no conversational dyspnea. Extremities: Moves * 4 Neurologic: A and O * 3, good insight  DIAGNOSTIC DATA REVIEWED: BMET    Component Value Date/Time   NA 138 06/06/2023 1525   K 4.5 06/06/2023 1525   CL 98 06/06/2023 1525   CO2 25 06/06/2023 1525   GLUCOSE 85 06/06/2023 1525   GLUCOSE 106 (H) 07/08/2022 0838   GLUCOSE 93 01/27/2006 0917   BUN 17 06/06/2023 1525   CREATININE 0.89 06/06/2023 1525   CREATININE 0.91 12/14/2019 1010   CALCIUM 9.2 06/06/2023 1525   GFRNONAA 92 12/14/2019 1010   GFRAA 107 12/14/2019 1010   Lab Results  Component Value Date   HGBA1C 5.6 05/14/2023    HGBA1C 5.7 10/17/2014   Lab Results  Component Value Date   INSULIN  7.8 12/31/2022   Lab Results  Component Value Date   TSH 1.670 12/31/2022   CBC    Component Value Date/Time   WBC 14.5 (H) 06/18/2023 0851   RBC 4.33 06/18/2023 0851   HGB 12.5 (L) 06/18/2023 0851   HGB 12.8 (L) 06/06/2023 1525   HCT 36.9 (L) 06/18/2023 0851   HCT 39.1 06/06/2023 1525   PLT 444.0 (H) 06/18/2023 0851   PLT 444 06/06/2023 1525   MCV 85.2 06/18/2023 0851   MCV 89 06/06/2023 1525   MCH 29.2 06/06/2023 1525   MCH 30.7 12/14/2019 1010   MCHC 33.8 06/18/2023 0851   RDW 12.9 06/18/2023 0851   RDW 11.7 06/06/2023 1525   Iron Studies No results found for: IRON, TIBC, FERRITIN, IRONPCTSAT Lipid Panel     Component Value Date/Time   CHOL 171 12/31/2022 0936   TRIG 83 12/31/2022 0936   TRIG 119 01/27/2006 0917   HDL 49 12/31/2022 0936   CHOLHDL 4 07/08/2022 0838   VLDL 18.4 07/08/2022 0838   LDLCALC 106 (H) 12/31/2022 0936   LDLCALC 103 (H) 12/14/2019 1010   LDLDIRECT 144.9 03/10/2008 0812   Hepatic Function Panel     Component Value Date/Time   PROT 6.9 05/14/2023 0952   ALBUMIN 4.5 05/14/2023 0952   AST 14 05/14/2023 0952   ALT 14 05/14/2023 0952   ALKPHOS 94 05/14/2023 0952   BILITOT 0.3 05/14/2023 0952   BILIDIR 0.1 07/08/2022 0838   IBILI 0.6 12/14/2019 1010      Component Value Date/Time   TSH 1.670 12/31/2022 0936   Nutritional Lab Results  Component Value Date   VD25OH 61.8 05/14/2023   VD25OH 39.7 12/31/2022    Attestations:   I, Connor Thomas, acting as a medical scribe for Connor Jenkins, DO., have compiled all relevant documentation for today's office visit on behalf of Connor Jenkins, DO, while in the presence of [employer], DO.  Reviewed by clinician on day of visit: allergies, medications, problem list, medical history, surgical history, family history, social history, and previous encounter notes pertinent to patient's obesity diagnosis.  I  have reviewed the above documentation for accuracy and completeness, and I agree with the above. Connor JINNY Thomas, D.O.  The 21st Century Cures Act was signed into law in 2016 which includes the topic of electronic health records.  This provides immediate access to information in MyChart.  This includes consultation notes, operative notes, office notes, lab results  and pathology reports.  If you have any questions about what you read please let us  know at your next visit so we can discuss your concerns and take corrective action if need be.  We are right here with you.

## 2023-07-11 ENCOUNTER — Other Ambulatory Visit (HOSPITAL_COMMUNITY): Payer: Self-pay

## 2023-07-11 ENCOUNTER — Other Ambulatory Visit: Payer: Self-pay

## 2023-07-21 ENCOUNTER — Other Ambulatory Visit (HOSPITAL_COMMUNITY): Payer: Self-pay

## 2023-07-28 ENCOUNTER — Other Ambulatory Visit: Payer: Self-pay | Admitting: Family Medicine

## 2023-07-28 ENCOUNTER — Other Ambulatory Visit: Payer: Self-pay

## 2023-08-08 ENCOUNTER — Other Ambulatory Visit (HOSPITAL_COMMUNITY): Payer: Self-pay

## 2023-08-11 ENCOUNTER — Other Ambulatory Visit (HOSPITAL_COMMUNITY): Payer: Self-pay

## 2023-08-11 ENCOUNTER — Encounter (INDEPENDENT_AMBULATORY_CARE_PROVIDER_SITE_OTHER): Payer: Self-pay | Admitting: Family Medicine

## 2023-08-11 ENCOUNTER — Ambulatory Visit (INDEPENDENT_AMBULATORY_CARE_PROVIDER_SITE_OTHER): Admitting: Family Medicine

## 2023-08-11 VITALS — BP 126/66 | HR 66 | Temp 98.4°F | Ht 67.0 in | Wt 203.0 lb

## 2023-08-11 DIAGNOSIS — E538 Deficiency of other specified B group vitamins: Secondary | ICD-10-CM

## 2023-08-11 DIAGNOSIS — E66811 Obesity, class 1: Secondary | ICD-10-CM

## 2023-08-11 DIAGNOSIS — R7303 Prediabetes: Secondary | ICD-10-CM | POA: Diagnosis not present

## 2023-08-11 DIAGNOSIS — E669 Obesity, unspecified: Secondary | ICD-10-CM

## 2023-08-11 DIAGNOSIS — E559 Vitamin D deficiency, unspecified: Secondary | ICD-10-CM | POA: Diagnosis not present

## 2023-08-11 DIAGNOSIS — Z6831 Body mass index (BMI) 31.0-31.9, adult: Secondary | ICD-10-CM

## 2023-08-11 DIAGNOSIS — E65 Localized adiposity: Secondary | ICD-10-CM

## 2023-08-11 MED ORDER — VITAMIN D (ERGOCALCIFEROL) 1.25 MG (50000 UNIT) PO CAPS
50000.0000 [IU] | ORAL_CAPSULE | ORAL | 1 refills | Status: DC
  Filled 2023-08-11 – 2023-08-18 (×2): qty 4, 28d supply, fill #0
  Filled 2023-08-25 – 2023-09-26 (×2): qty 4, 28d supply, fill #1

## 2023-08-11 MED ORDER — METFORMIN HCL ER 500 MG PO TB24
1000.0000 mg | ORAL_TABLET | Freq: Every day | ORAL | 1 refills | Status: DC
  Filled 2023-08-11: qty 60, 30d supply, fill #0
  Filled 2023-08-25 – 2023-10-30 (×2): qty 60, 30d supply, fill #1

## 2023-08-11 NOTE — Progress Notes (Signed)
 Connor Thomas, D.O.  ABFM, ABOM Specializing in Clinical Bariatric Medicine  Office located at: [address] [place], [place]  [address]   Assessment and Plan:   Medications Discontinued During This Encounter  Medication Reason   Cholecalciferol (VITAMIN D3) 50 MCG (2000 UT) capsule Change in therapy     Meds ordered this encounter  Medications   Vitamin D , Ergocalciferol , (DRISDOL ) 1.25 MG (50000 UNIT) CAPS capsule    Sig: Take 1 capsule (50,000 Units total) by mouth every 7 (seven) days.    Dispense:  4 capsule    Refill:  1   metFORMIN  (GLUCOPHAGE -XR) 500 MG 24 hr tablet    Sig: Take 2 tablets (1,000 mg total) by mouth daily with food    Dispense:  60 tablet    Refill:  1     FOR THE DISEASE OF OBESITY:  BMI 31.0-31.9,adult Obesity (BMI 30.0-34.9) -starting bmi 32.88 Assessment & Plan: Since last office visit on 07/07/2023 patient's muscle mass has decreased by 0.6 lbs. Fat mass has increased by 4.2 lbs. Total body water has increased by 0.8 lbs.  Counseling done on how various foods will affect these numbers and how to maximize success  Total lbs lost to date: - 7 lbs Total weight loss percentage to date: -3.33 %   Recommended Dietary Goals Connor Thomas is currently in the action stage of change. As such, his goal is to continue weight management plan.  He has agreed to: continue current plan   Behavioral Intervention We discussed the following today: continue to work on maintaining a reduced calorie state, getting the recommended amount of protein, incorporating whole foods, making healthy choices, staying well hydrated and practicing mindfulness when eating.  Additional resources provided today: Handout on Common Characteristics of Successful Weight Losers and Maintainers   Evidence-based interventions for health behavior change were utilized today including the discussion of self monitoring techniques, problem-solving barriers and SMART goal setting  techniques.   Regarding patient's less desirable eating habits and patterns, we employed the technique of small changes.   Pt will specifically work on: n/a   Recommended Physical Activity Goals Connor Thomas has been advised to work up to 300-450 minutes of moderate intensity aerobic activity a week and strengthening exercises 2-3 times per week for cardiovascular health, weight loss maintenance and preservation of muscle mass.   He has agreed to: continue to gradually increase the amount and intensity of exercise routine   Pharmacotherapy Continue same regimen.    ASSOCIATED CONDITIONS ADDRESSED TODAY:   Prediabetes Assessment & Plan: Lab Results  Component Value Date   HGBA1C 5.6 05/14/2023   HGBA1C 5.8 (H) 12/31/2022   HGBA1C 5.7 07/08/2022   INSULIN  7.8 12/31/2022   He is prescribed Metformin  XR 500 mg two tablets daily; he missed a few doses over vacation, but has been back to taking it consistently. Tolerating well. Good control of hunger and cravings when following prudent nutritional plan.   - Continue Metformin ; refill today.  - Continue balanced diet focusing on protein, fruits, and vegetables while limiting simple carbohydrates.    Visceral obesity Assessment & Plan: Current visceral fat rating: 16.  The visceral fat rating should be <10 in a male.    - Visceral adipose tissue is a hormonally active component of total body fat. This body composition phenotype is associated with medical disorders such as metabolic syndrome, cardiovascular disease and several malignancies including prostate, breast, and colorectal cancers. -Goal: Lose 7-10% of weight via prudent nutritional plan  and lifestyle changes.      B12 insufficieny Assessment & Plan: Lab Results  Component Value Date   VITAMINB12 391 05/14/2023   Doing well on OTC B12 500 mcg daily. No acute concerns.   - Continue B12 supplementation.  - Continue prudent nutritional plan and focus on b12 rich foods such  as lean red meats; poultry; eggs; seafood; beans, peas, and lentils; nuts and seeds; and soy products - Recheck; future.     Vitamin D  insufficiency Assessment & Plan: Lab Results  Component Value Date   VD25OH 61.8 05/14/2023   VD25OH 39.7 12/31/2022   Currently on Ergocalciferol  50,000 units weekly without any adverse effects such as nausea, vomiting or muscle weakness. No acute concerns.   - Continue vitamin D  supplementation - Recheck levels in the future    Follow up:   Return 09/08/2023 @ 11:00 AM.  He was informed of the importance of frequent follow up visits to maximize his success with intensive lifestyle modifications for his multiple health conditions.   Subjective:   Chief complaint: Obesity Connor Thomas is here to discuss his progress with his obesity treatment plan. He is on the Category 3 Plan with B/L options and given the option to follow Low Carb MP and states he is following his eating plan approximately 50% of the time. He states he is walking and doing simple arm movements 60 minutes once weekly.    Interval History:  Connor Thomas is here for a follow up office visit. Since last OV on 07/07/2023 , he is up 4 lbs. He recently returned from a pleasant trip at [facility]. He endorses eating many off-plan foods while there. He is back on track with his meal plan since returning. He drinks about 1 gallon of water daily.    Pharmacotherapy that aid with weight loss: He is prescribed Metformin  XR 500 mg two tablets daily.    Review of Systems:  Pertinent positives were addressed with patient today.  Reviewed by clinician on day of visit: allergies, medications, problem list, medical history, surgical history, family history, social history, and previous encounter notes.  Weight Summary and Biometrics   Weight Lost Since Last Visit: 0  Weight Gained Since Last Visit: 4 lb   Vitals Temp: 98.4 F (36.9 C) BP: 126/66 Pulse Rate: 66 SpO2: 99  %   Anthropometric Measurements Height: 5' 7 (1.702 m) Weight: 203 lb (92.1 kg) BMI (Calculated): 31.79 Weight at Last Visit: 199 lb Weight Lost Since Last Visit: 0 Weight Gained Since Last Visit: 4 lb Starting Weight: 210 lb Total Weight Loss (lbs): 7 lb (3.175 kg)   Body Composition  Body Fat %: 27.2 % Fat Mass (lbs): 55.2 lbs Muscle Mass (lbs): 140.4 lbs Total Body Water (lbs): 98.2 lbs Visceral Fat Rating : 16   Other Clinical Data Today's Visit #: 8 Starting Date: 12/31/22 Comments: Cat 3    Objective:   PHYSICAL EXAM: Blood pressure 126/66, pulse 66, temperature 98.4 F (36.9 C), height 5' 7 (1.702 m), weight 203 lb (92.1 kg), SpO2 99%. Body mass index is 31.79 kg/m.  General: he is overweight, cooperative and in no acute distress. PSYCH: Has normal mood, affect and thought process.   HEENT: EOMI, sclerae are anicteric. Lungs: Normal breathing effort, no conversational dyspnea. Extremities: Moves * 4 Neurologic: A and O * 3, good insight  DIAGNOSTIC DATA REVIEWED: BMET    Component Value Date/Time   NA 138 06/06/2023 1525   K 4.5 06/06/2023 1525  CL 98 06/06/2023 1525   CO2 25 06/06/2023 1525   GLUCOSE 85 06/06/2023 1525   GLUCOSE 106 (H) 07/08/2022 0838   GLUCOSE 93 01/27/2006 0917   BUN 17 06/06/2023 1525   CREATININE 0.89 06/06/2023 1525   CREATININE 0.91 12/14/2019 1010   CALCIUM 9.2 06/06/2023 1525   GFRNONAA 92 12/14/2019 1010   GFRAA 107 12/14/2019 1010   Lab Results  Component Value Date   HGBA1C 5.6 05/14/2023   HGBA1C 5.7 10/17/2014   Lab Results  Component Value Date   INSULIN  7.8 12/31/2022   Lab Results  Component Value Date   TSH 1.670 12/31/2022   CBC    Component Value Date/Time   WBC 14.5 (H) 06/18/2023 0851   RBC 4.33 06/18/2023 0851   HGB 12.5 (L) 06/18/2023 0851   HGB 12.8 (L) 06/06/2023 1525   HCT 36.9 (L) 06/18/2023 0851   HCT 39.1 06/06/2023 1525   PLT 444.0 (H) 06/18/2023 0851   PLT 444 06/06/2023  1525   MCV 85.2 06/18/2023 0851   MCV 89 06/06/2023 1525   MCH 29.2 06/06/2023 1525   MCH 30.7 12/14/2019 1010   MCHC 33.8 06/18/2023 0851   RDW 12.9 06/18/2023 0851   RDW 11.7 06/06/2023 1525   Iron Studies No results found for: IRON, TIBC, FERRITIN, IRONPCTSAT Lipid Panel     Component Value Date/Time   CHOL 171 12/31/2022 0936   TRIG 83 12/31/2022 0936   TRIG 119 01/27/2006 0917   HDL 49 12/31/2022 0936   CHOLHDL 4 07/08/2022 0838   VLDL 18.4 07/08/2022 0838   LDLCALC 106 (H) 12/31/2022 0936   LDLCALC 103 (H) 12/14/2019 1010   LDLDIRECT 144.9 03/10/2008 0812   Hepatic Function Panel     Component Value Date/Time   PROT 6.9 05/14/2023 0952   ALBUMIN 4.5 05/14/2023 0952   AST 14 05/14/2023 0952   ALT 14 05/14/2023 0952   ALKPHOS 94 05/14/2023 0952   BILITOT 0.3 05/14/2023 0952   BILIDIR 0.1 07/08/2022 0838   IBILI 0.6 12/14/2019 1010      Component Value Date/Time   TSH 1.670 12/31/2022 0936   Nutritional Lab Results  Component Value Date   VD25OH 61.8 05/14/2023   VD25OH 39.7 12/31/2022    Attestations:   I, Special Puri, acting as a Stage manager for [employer], DO., have compiled all relevant documentation for today's office visit on behalf of Connor Jenkins, DO, while in the presence of [employer], DO.  I have reviewed the above documentation for accuracy and completeness, and I agree with the above. Connor JINNY Thomas, D.O.  The 21st Century Cures Act was signed into law in 2016 which includes the topic of electronic health records.  This provides immediate access to information in MyChart.  This includes consultation notes, operative notes, office notes, lab results and pathology reports.  If you have any questions about what you read please let us  know at your next visit so we can discuss your concerns and take corrective action if need be.  We are right here with you.

## 2023-08-12 ENCOUNTER — Other Ambulatory Visit (HOSPITAL_COMMUNITY): Payer: Self-pay

## 2023-08-18 ENCOUNTER — Other Ambulatory Visit (HOSPITAL_COMMUNITY): Payer: Self-pay

## 2023-08-18 ENCOUNTER — Other Ambulatory Visit: Payer: Self-pay

## 2023-08-20 ENCOUNTER — Ambulatory Visit (HOSPITAL_BASED_OUTPATIENT_CLINIC_OR_DEPARTMENT_OTHER): Admitting: Family Medicine

## 2023-08-20 ENCOUNTER — Other Ambulatory Visit (HOSPITAL_COMMUNITY): Payer: Self-pay

## 2023-08-20 ENCOUNTER — Encounter (HOSPITAL_BASED_OUTPATIENT_CLINIC_OR_DEPARTMENT_OTHER): Payer: Self-pay | Admitting: Family Medicine

## 2023-08-20 ENCOUNTER — Encounter (HOSPITAL_BASED_OUTPATIENT_CLINIC_OR_DEPARTMENT_OTHER): Payer: Self-pay

## 2023-08-20 VITALS — BP 117/73 | HR 65 | Temp 99.2°F | Ht 67.0 in | Wt 204.8 lb

## 2023-08-20 DIAGNOSIS — Z0000 Encounter for general adult medical examination without abnormal findings: Secondary | ICD-10-CM

## 2023-08-20 DIAGNOSIS — E559 Vitamin D deficiency, unspecified: Secondary | ICD-10-CM | POA: Diagnosis not present

## 2023-08-20 DIAGNOSIS — F32A Depression, unspecified: Secondary | ICD-10-CM

## 2023-08-20 DIAGNOSIS — M791 Myalgia, unspecified site: Secondary | ICD-10-CM

## 2023-08-20 DIAGNOSIS — R7303 Prediabetes: Secondary | ICD-10-CM

## 2023-08-20 DIAGNOSIS — E785 Hyperlipidemia, unspecified: Secondary | ICD-10-CM

## 2023-08-20 DIAGNOSIS — Z125 Encounter for screening for malignant neoplasm of prostate: Secondary | ICD-10-CM

## 2023-08-20 MED ORDER — CITALOPRAM HYDROBROMIDE 20 MG PO TABS
30.0000 mg | ORAL_TABLET | Freq: Every day | ORAL | 1 refills | Status: DC
  Filled 2023-08-20: qty 47, 31d supply, fill #0
  Filled 2023-08-20: qty 45, 30d supply, fill #0
  Filled 2023-08-25 – 2023-09-26 (×2): qty 47, 31d supply, fill #1

## 2023-08-20 NOTE — Progress Notes (Unsigned)
 New Patient Office Visit  Subjective   Patient ID: Connor Thomas, male    DOB: [birth year]  Age: 63 y.o. MRN: [id number]  CC:  Chief Complaint  Patient presents with  . New Patient (Initial Visit)    HPI Connor Thomas presents to establish care Last PCP - Dr. [name]  Patient primarily has been dealing with a somewhat newer issue of fatigue, bilateral shoulder pain, headaches.  With patient's constellation of symptoms, he did have evaluation through primary care provider and ultimately was diagnosed and treated polymyalgia rheumatica.  He did note improvement with steroids, however progress with use of steroids was gradual with slow tapering of medication utilized.  He indicates that he has gotten set up with rheumatology, however provider through [facility] health did not have availability until later this year.  He is looking to get set up with [facility] rheumatology who does have sooner availability, but has had difficulty with having medical records transferred to be able to arrange for this consultation.  Patient is originally from [place] , has lived here for about 45 years. He enjoys golfing, this is limited with ongoing issues. He enjoys going to [employer], mountains, some hiking.  Outpatient Encounter Medications as of 08/20/2023  Medication Sig  . Acetaminophen  (TYLENOL  PO) Take by mouth as needed.  . albuterol  (PROVENTIL  HFA) 108 (90 Base) MCG/ACT inhaler Inhale 2 puffs into the lungs every 4 (four) hours as needed for wheezing or shortness of breath.  . cyanocobalamin  (VITAMIN B12) 500 MCG tablet Take 1 tablet (500 mcg total) by mouth daily.  . fluticasone  (FLONASE ) 50 MCG/ACT nasal spray Instill 1 spray into each nostril following sinus rinses twice daily  . levocetirizine (XYZAL ) 5 MG tablet Take 1 tablet (5 mg total) by mouth every evening.  . metFORMIN  (GLUCOPHAGE -XR) 500 MG 24 hr tablet Take 2 tablets (1,000 mg total) by mouth daily with food  . montelukast  (SINGULAIR ) 10 MG tablet  Take 1 tablet (10 mg total) by mouth at bedtime.  . omeprazole  (PRILOSEC) 20 MG capsule Take 1 capsule (20 mg total) by mouth daily.  . predniSONE  (DELTASONE ) 10 MG tablet Take 0.5-1 tablets (5-10 mg total) by mouth daily with breakfast.  . simvastatin  (ZOCOR ) 80 MG tablet Take 1 tablet (80 mg total) by mouth daily.  . Vitamin D , Ergocalciferol , (DRISDOL ) 1.25 MG (50000 UNIT) CAPS capsule Take 1 capsule (50,000 Units total) by mouth every 7 (seven) days.  . [DISCONTINUED] amphetamine -dextroamphetamine  (ADDERALL XR) 10 MG 24 hr capsule Take 1 capsule (10 mg total) by mouth daily.  . [DISCONTINUED] amphetamine -dextroamphetamine  (ADDERALL XR) 10 MG 24 hr capsule Take 1 capsule (10 mg total) by mouth daily.  . [DISCONTINUED] amphetamine -dextroamphetamine  (ADDERALL XR) 10 MG 24 hr capsule Take 1 capsule (10 mg total) by mouth daily.  . [DISCONTINUED] citalopram  (CELEXA ) 20 MG tablet Take 1 tablet (20 mg total) by mouth daily.  . acyclovir  (ZOVIRAX ) 200 MG capsule TAKE 1 CAPSULE BY MOUTH FIVE TIMES DAILY (Patient not taking: Reported on 08/20/2023)  . citalopram  (CELEXA ) 20 MG tablet Take 1.5 tablets (30 mg total) by mouth daily.  . Ibuprofen (ADVIL PO) Take by mouth as needed. (Patient not taking: Reported on 08/20/2023)  . meloxicam  (MOBIC ) 7.5 MG tablet Take 1 tablet (7.5 mg total) by mouth 2 (two) times daily for 21 days (Patient not taking: Reported on 08/20/2023)   No facility-administered encounter medications on file as of 08/20/2023.    Past Medical History:  Diagnosis Date  . Allergy   .  Anxiety   . Arthritis    shoulders,ankles,lower back  . Asthma    as a child  . Back pain   . Cancer (HCC)    melanoma  . Depression   . Edema   . Ganglion of right wrist   . GERD (gastroesophageal reflux disease)   . Hernia, inguinal   . Hip pain   . Hyperlipidemia   . Melanoma in situ of nose (HCC) 11/2012  . Rotator cuff tear    LEFT  . Shoulder pain     Past Surgical History:  Procedure  Laterality Date  . COLONOSCOPY  06/25/2021   per Dr. Leigh, benign polyps, repeat in 7 to 10 yrs  . ESOPHAGOGASTRODUODENOSCOPY  04/25/2006  . HERNIA REPAIR    . left knee scope    . POLYPECTOMY    . TONSILLECTOMY AND ADENOIDECTOMY    . UMBILICAL HERNIA REPAIR N/A 03/25/2012   Procedure: HERNIA REPAIR-- UMBILICAL-- ADULT;  Surgeon: Donnice KATHEE Lunger, MD;  Location: [facility];  Service: General;  Laterality: N/A;  . UMBILICAL HERNIA REPAIR N/A 05/16/2016   Procedure: LAPAROSCOPIC ASSISTED REPAIR OF RECURRENT UMBILICAL HERNIA WITH MESH;  Surgeon: Donnice Lunger, MD;  Location: [facility];  Service: General;  Laterality: N/A;    Family History  Problem Relation Age of Onset  . Hypertension Mother   . Hyperlipidemia Mother   . Depression Mother   . Anxiety disorder Mother   . Obesity Mother   . Pancreatic cancer Father   . Hyperlipidemia Father   . Heart disease Father   . Cancer Father   . Depression Father   . Anxiety disorder Father   . Sleep apnea Father   . Obesity Father   . Colon cancer Neg Hx   . Colon polyps Neg Hx   . Crohn's disease Neg Hx   . Esophageal cancer Neg Hx   . Rectal cancer Neg Hx   . Stomach cancer Neg Hx     Social History   Socioeconomic History  . Marital status: Married    Spouse name: Not on file  . Number of children: Not on file  . Years of education: Not on file  . Highest education level: Associate degree: occupational, Scientist, product/process development, or vocational program  Occupational History  . Not on file  Tobacco Use  . Smoking status: Former    Current packs/day: 0.00    Types: Cigarettes    Quit date: 01/08/2018    Years since quitting: 5.6    Passive exposure: Never  . Smokeless tobacco: Never  Vaping Use  . Vaping status: Never Used  Substance and Sexual Activity  . Alcohol use: Not Currently    Alcohol/week: 12.0 standard drinks of alcohol    Types: 12 Cans of beer per week  . Drug use: No  . Sexual activity: Yes  Other  Topics Concern  . Not on file  Social History Narrative  . Not on file   Social Drivers of Health   Financial Resource Strain: Low Risk  (08/17/2023)   Overall Financial Resource Strain (CARDIA)   . Difficulty of Paying Living Expenses: Not hard at all  Food Insecurity: No Food Insecurity (08/17/2023)   Hunger Vital Sign   . Worried About Programme researcher, broadcasting/film/video in the Last Year: Never true   . Ran Out of Food in the Last Year: Never true  Transportation Needs: No Transportation Needs (08/17/2023)   PRAPARE - Transportation   . Lack of Transportation (  Medical): No   . Lack of Transportation (Non-Medical): No  Physical Activity: Inactive (08/17/2023)   Exercise Vital Sign   . Days of Exercise per Week: 0 days   . Minutes of Exercise per Session: Not on file  Stress: Stress Concern Present (08/17/2023)   [employer] of Occupational Health - Occupational Stress Questionnaire   . Feeling of Stress: To some extent  Social Connections: Unknown (08/17/2023)   Social Connection and Isolation Panel   . Frequency of Communication with Friends and Family: Three times a week   . Frequency of Social Gatherings with Friends and Family: Twice a week   . Attends Religious Services: Patient declined   . Active Member of Clubs or Organizations: Patient declined   . Attends Banker Meetings: Not on file   . Marital Status: Married  Catering manager Violence: Unknown (07/30/2021)   Received from [facility]   HITS   . Physically Hurt: Not on file   . Insult or Talk Down To: Not on file   . Threaten Physical Harm: Not on file   . Scream or Curse: Not on file    Objective   BP 117/73   Pulse 65   Temp 99.2 F (37.3 C) (Oral)   Ht 5' 7 (1.702 m)   Wt 204 lb 12.8 oz (92.9 kg)   SpO2 93%   BMI 32.08 kg/m   Physical Exam  63 year old male in no acute distress Cardiovascular exam with regular rate and rhythm Lungs clear to auscultation bilaterally  Assessment & Plan:    Prostate cancer screening -     PSA Total (Reflex To Free); Future  Wellness examination -     CBC with Differential/Platelet; Future -     Comprehensive metabolic panel with GFR; Future -     Hemoglobin A1c; Future -     Lipid panel; Future -     TSH Rfx on Abnormal to Free T4; Future -     PSA Total (Reflex To Free); Future  Myalgia -     Sedimentation rate; Future -     C-reactive protein; Future  Vitamin D  deficiency -     VITAMIN D  25 Hydroxy (Vit-D Deficiency, Fractures); Future  Other orders -     Citalopram  Hydrobromide; Take 1.5 tablets (30 mg total) by mouth daily.  Dispense: 135 tablet; Refill: 1  Return in about 1 month (around 09/20/2023) for CPE with fasting labs 1 week prior.   Spent 49 minutes on this patient encounter, including preparation, chart review, face-to-face counseling with patient and coordination of care, and documentation of encounter   ___________________________________________ Kynzi Levay de [place], MD, ABFM, [facility] and Sports Medicine [facility]

## 2023-08-25 ENCOUNTER — Other Ambulatory Visit (HOSPITAL_COMMUNITY): Payer: Self-pay

## 2023-08-25 ENCOUNTER — Other Ambulatory Visit: Payer: Self-pay

## 2023-08-26 ENCOUNTER — Other Ambulatory Visit (HOSPITAL_COMMUNITY): Payer: Self-pay

## 2023-08-28 DIAGNOSIS — R7303 Prediabetes: Secondary | ICD-10-CM | POA: Insufficient documentation

## 2023-08-28 NOTE — Assessment & Plan Note (Signed)
 Most recent hemoglobin A1c within normal range, however has had prior A1c readings within prediabetes range.  Recommend continued management with lifestyle modifications, can continue with metformin .

## 2023-08-28 NOTE — Assessment & Plan Note (Addendum)
 Patient has been managing with citalopram , however feels that control of symptoms is not currently optimized.  We discussed considerations related to medication management.  He would be amenable to slight dose change to try to better control ongoing symptoms.  Did discuss that he can take up to 4 to 6 weeks to see full effect of new dose of medication.  We will assess progress at next visit

## 2023-08-28 NOTE — Assessment & Plan Note (Signed)
 Borderline control of lipids on prior labs.  We will reassess with next lab draw.  We will need to consider medication reaction within differential for above issue which previously has been diagnosed as PMR

## 2023-09-08 ENCOUNTER — Ambulatory Visit (INDEPENDENT_AMBULATORY_CARE_PROVIDER_SITE_OTHER): Admitting: Family Medicine

## 2023-09-08 ENCOUNTER — Encounter (INDEPENDENT_AMBULATORY_CARE_PROVIDER_SITE_OTHER): Payer: Self-pay | Admitting: Family Medicine

## 2023-09-08 VITALS — BP 105/66 | HR 65 | Temp 98.7°F | Ht 67.0 in | Wt 203.0 lb

## 2023-09-08 DIAGNOSIS — Z6831 Body mass index (BMI) 31.0-31.9, adult: Secondary | ICD-10-CM

## 2023-09-08 DIAGNOSIS — E66811 Obesity, class 1: Secondary | ICD-10-CM

## 2023-09-08 DIAGNOSIS — E669 Obesity, unspecified: Secondary | ICD-10-CM

## 2023-09-08 DIAGNOSIS — R7303 Prediabetes: Secondary | ICD-10-CM | POA: Diagnosis not present

## 2023-09-08 DIAGNOSIS — E559 Vitamin D deficiency, unspecified: Secondary | ICD-10-CM

## 2023-09-08 DIAGNOSIS — J302 Other seasonal allergic rhinitis: Secondary | ICD-10-CM

## 2023-09-08 DIAGNOSIS — J3089 Other allergic rhinitis: Secondary | ICD-10-CM

## 2023-09-08 DIAGNOSIS — E65 Localized adiposity: Secondary | ICD-10-CM

## 2023-09-08 NOTE — Progress Notes (Signed)
 Connor Thomas, D.O.  ABFM, ABOM Specializing in Clinical Bariatric Medicine  Office located at: [address] [place], [place]  [address]   Assessment and Plan:    FOR THE DISEASE OF OBESITY:  BMI 31.0-31.9,adult current 31.79 Obesity (BMI 30.0-34.9) -starting bmi 32.88 Assessment & Plan: Since last office visit on 08/11/2023 patient's muscle mass has increased by 1.6 lbs. Fat mass has decreased by 1.4 lbs. Total body water has increased by 0.6 lbs.  Body fat % has decreased by 0.7 %. Counseling done on how various foods will affect these numbers and how to maximize success  Total lbs lost to date: -7 lbs Total weight loss percentage to date: -3.33 %   Recommended Dietary Goals [name] is currently in the action stage of change. As such, his goal is to continue weight management plan.  He has agreed to: continue current plan   Behavioral Intervention We discussed the following today: continue to work on maintaining a reduced calorie state, getting the recommended amount of protein, incorporating whole foods, making healthy choices, staying well hydrated and practicing mindfulness when eating.  Additional resources provided today: n/a  Evidence-based interventions for health behavior change were utilized today including the discussion of self monitoring techniques, problem-solving barriers and SMART goal setting techniques.   Regarding patient's less desirable eating habits and patterns, we employed the technique of small changes.   Goal: n/a    Recommended Physical Activity Goals Cirilo has been advised to work up to 300-450 minutes of moderate intensity aerobic activity a week and strengthening exercises 2-3 times per week for cardiovascular health, weight loss maintenance and preservation of muscle mass. We also discussed his ideal heart rate (110 BPM) to be in a fat-burning state.   Goal: continue walking and yard work and highly recommend he add  HIT training or  strengthening exercises with a goal of 2-3 sessions a week    Pharmacotherapy Continue same regimen.    ASSOCIATED CONDITIONS ADDRESSED TODAY:   Prediabetes Assessment & Plan: Lab Results  Component Value Date   HGBA1C 5.6 05/14/2023   HGBA1C 5.8 (H) 12/31/2022   HGBA1C 5.7 07/08/2022   INSULIN  7.8 12/31/2022    On Metformin  XR 500 mg twice daily with reported good compliance and tolerance. Good control of hunger and cravings. He endorses getting in the recommended amounts of lean proteins. He will continue Metformin  at current dose and continue his balanced diet focusing on protein, fruits, and vegetables while limiting simple carbohydrates.     Environmental and seasonal allergies Assessment & Plan: He is currently not taking his Xyzal  or Singulair  because his allergy symptoms are well controlled. However, they do flair up during the fall season so I encouraged him to start taking his allergy medications now. Continue low-inflammatory meal plan.     Visceral obesity Assessment & Plan: Current visceral fat rating: 16.  The visceral fat rating should be <10 in a male.  Diagnosis was reviewed with the patient and education was provided. Goal: Lose 7-10% of weight via prudent nutritional plan and lifestyle changes (e.g strength training).     Vitamin D  deficiency Assessment & Plan: Lab Results  Component Value Date   VD25OH 61.8 05/14/2023   VD25OH 39.7 12/31/2022   Currently on Ergocalciferol  50,000 units weekly without any adverse effects such as nausea, vomiting or muscle weakness. Continue vitamin D  supplementation. Recheck levels in the future.    Follow up:   Return 10/06/2023 12:20 PM.  He was informed  of the importance of frequent follow up visits to maximize his success with intensive lifestyle modifications for his multiple health conditions.   Subjective:   Chief complaint: Obesity Kirk is here to discuss his progress with his obesity treatment plan.   He is on the Category 3 Plan with B/L options and given the option to follow Low Carb MP and states he is following his eating plan approximately 75% of the time. He states he is walking or doing yard-work 30 minutes 4-5 days per week.   Interval History:  Connor Thomas is here for a follow up office visit. Since last OV on 08/11/2023 , his weight has not changed. He was on vacation for 3 days and ate some off plan foods like ice-cream while there. Prior to vacation and since returning, he has been doing well with following his MP- especially with his lean protein intake. Of note, he is meeting with rheumatology in the near future.   Pharmacotherapy that aid with weight loss: He is prescribed Metformin  XR 500 mg two tablets daily.    Review of Systems:  Pertinent positives were addressed with patient today.  Reviewed by clinician on day of visit: allergies, medications, problem list, medical history, surgical history, family history, social history, and previous encounter notes.  Weight Summary and Biometrics   Weight Lost Since Last Visit: 0  Weight Gained Since Last Visit: 0   Vitals Temp: 98.7 F (37.1 C) BP: 105/66 Pulse Rate: 65 SpO2: 96 %   Anthropometric Measurements Height: 5' 7 (1.702 m) Weight: 203 lb (92.1 kg) BMI (Calculated): 31.79 Weight at Last Visit: 203lb Weight Lost Since Last Visit: 0 Weight Gained Since Last Visit: 0 Starting Weight: 210lb Total Weight Loss (lbs): 7 lb (3.175 kg)   Body Composition  Body Fat %: 26.5 % Fat Mass (lbs): 53.8 lbs Muscle Mass (lbs): 142 lbs Total Body Water (lbs): 98.8 lbs Visceral Fat Rating : 16   Other Clinical Data Fasting: no Labs: no Today's Visit #: 9lb Starting Date: 12/31/22   Objective:   PHYSICAL EXAM: Blood pressure 105/66, pulse 65, temperature 98.7 F (37.1 C), height 5' 7 (1.702 m), weight 203 lb (92.1 kg), SpO2 96%. Body mass index is 31.79 kg/m.  General: he is overweight, cooperative and  in no acute distress. PSYCH: Has normal mood, affect and thought process.   HEENT: EOMI, sclerae are anicteric. Lungs: Normal breathing effort, no conversational dyspnea. Extremities: Moves * 4 Neurologic: A and O * 3, good insight  DIAGNOSTIC DATA REVIEWED: BMET    Component Value Date/Time   NA 138 06/06/2023 1525   K 4.5 06/06/2023 1525   CL 98 06/06/2023 1525   CO2 25 06/06/2023 1525   GLUCOSE 85 06/06/2023 1525   GLUCOSE 106 (H) 07/08/2022 0838   GLUCOSE 93 01/27/2006 0917   BUN 17 06/06/2023 1525   CREATININE 0.89 06/06/2023 1525   CREATININE 0.91 12/14/2019 1010   CALCIUM 9.2 06/06/2023 1525   GFRNONAA 92 12/14/2019 1010   GFRAA 107 12/14/2019 1010   Lab Results  Component Value Date   HGBA1C 5.6 05/14/2023   HGBA1C 5.7 10/17/2014   Lab Results  Component Value Date   INSULIN  7.8 12/31/2022   Lab Results  Component Value Date   TSH 1.670 12/31/2022   CBC    Component Value Date/Time   WBC 14.5 (H) 06/18/2023 0851   RBC 4.33 06/18/2023 0851   HGB 12.5 (L) 06/18/2023 0851   HGB 12.8 (L) 06/06/2023 1525  HCT 36.9 (L) 06/18/2023 0851   HCT 39.1 06/06/2023 1525   PLT 444.0 (H) 06/18/2023 0851   PLT 444 06/06/2023 1525   MCV 85.2 06/18/2023 0851   MCV 89 06/06/2023 1525   MCH 29.2 06/06/2023 1525   MCH 30.7 12/14/2019 1010   MCHC 33.8 06/18/2023 0851   RDW 12.9 06/18/2023 0851   RDW 11.7 06/06/2023 1525   Iron Studies No results found for: IRON, TIBC, FERRITIN, IRONPCTSAT Lipid Panel     Component Value Date/Time   CHOL 171 12/31/2022 0936   TRIG 83 12/31/2022 0936   TRIG 119 01/27/2006 0917   HDL 49 12/31/2022 0936   CHOLHDL 4 07/08/2022 0838   VLDL 18.4 07/08/2022 0838   LDLCALC 106 (H) 12/31/2022 0936   LDLCALC 103 (H) 12/14/2019 1010   LDLDIRECT 144.9 03/10/2008 0812   Hepatic Function Panel     Component Value Date/Time   PROT 6.9 05/14/2023 0952   ALBUMIN 4.5 05/14/2023 0952   AST 14 05/14/2023 0952   ALT 14 05/14/2023 0952    ALKPHOS 94 05/14/2023 0952   BILITOT 0.3 05/14/2023 0952   BILIDIR 0.1 07/08/2022 0838   IBILI 0.6 12/14/2019 1010      Component Value Date/Time   TSH 1.670 12/31/2022 0936   Nutritional Lab Results  Component Value Date   VD25OH 61.8 05/14/2023   VD25OH 39.7 12/31/2022    Attestations:   I, Special Puri, acting as a Stage manager for [employer], DO., have compiled all relevant documentation for today's office visit on behalf of Connor Jenkins, DO, while in the presence of [employer], DO.  I have reviewed the above documentation for accuracy and completeness, and I agree with the above. Connor JINNY Thomas, D.O.  The 21st Century Cures Act was signed into law in 2016 which includes the topic of electronic health records.  This provides immediate access to information in MyChart.  This includes consultation notes, operative notes, office notes, lab results and pathology reports.  If you have any questions about what you read please let us  know at your next visit so we can discuss your concerns and take corrective action if need be.  We are right here with you.

## 2023-09-09 ENCOUNTER — Other Ambulatory Visit (HOSPITAL_COMMUNITY): Payer: Self-pay

## 2023-09-11 ENCOUNTER — Ambulatory Visit (HOSPITAL_BASED_OUTPATIENT_CLINIC_OR_DEPARTMENT_OTHER)

## 2023-09-15 ENCOUNTER — Ambulatory Visit (HOSPITAL_BASED_OUTPATIENT_CLINIC_OR_DEPARTMENT_OTHER): Admitting: Family Medicine

## 2023-09-16 ENCOUNTER — Other Ambulatory Visit (HOSPITAL_COMMUNITY): Payer: Self-pay

## 2023-09-18 ENCOUNTER — Encounter (HOSPITAL_BASED_OUTPATIENT_CLINIC_OR_DEPARTMENT_OTHER): Admitting: Family Medicine

## 2023-09-25 ENCOUNTER — Telehealth (HOSPITAL_BASED_OUTPATIENT_CLINIC_OR_DEPARTMENT_OTHER): Payer: Self-pay | Admitting: *Deleted

## 2023-09-25 NOTE — Telephone Encounter (Signed)
 Copied from CRM [id number]. Topic: Appointments - Appointment Cancel/Reschedule >> Sep 25, 2023 10:56 AM Rosaria BRAVO wrote: Patient/patient representative is calling to cancel or reschedule an appointment. Refer to attachments for appointment information.   Pt needs to reschedule his physical. Wants to be seen by Thersia caudle, please advise if this is possible.   Best contact: [phone]

## 2023-09-26 ENCOUNTER — Other Ambulatory Visit (HOSPITAL_COMMUNITY): Payer: Self-pay

## 2023-09-27 ENCOUNTER — Other Ambulatory Visit (HOSPITAL_COMMUNITY): Payer: Self-pay

## 2023-10-01 ENCOUNTER — Encounter (HOSPITAL_BASED_OUTPATIENT_CLINIC_OR_DEPARTMENT_OTHER): Payer: Self-pay

## 2023-10-01 NOTE — Telephone Encounter (Signed)
 Called patient he did not want to change providers he was upset as he needed physical rescheduled and was told he could not be seen until January. Worked patient in to schedule in October. Patient was fine staying with de peru

## 2023-10-06 ENCOUNTER — Ambulatory Visit (INDEPENDENT_AMBULATORY_CARE_PROVIDER_SITE_OTHER): Admitting: Family Medicine

## 2023-10-13 ENCOUNTER — Other Ambulatory Visit (HOSPITAL_COMMUNITY): Payer: Self-pay

## 2023-10-13 MED ORDER — PREDNISONE 5 MG PO TABS
ORAL_TABLET | ORAL | 0 refills | Status: DC
  Filled 2023-10-13: qty 50, 20d supply, fill #0

## 2023-10-14 ENCOUNTER — Other Ambulatory Visit: Payer: Self-pay

## 2023-10-20 ENCOUNTER — Encounter: Admitting: Family Medicine

## 2023-10-30 ENCOUNTER — Other Ambulatory Visit (INDEPENDENT_AMBULATORY_CARE_PROVIDER_SITE_OTHER): Payer: Self-pay | Admitting: Family Medicine

## 2023-10-30 DIAGNOSIS — E559 Vitamin D deficiency, unspecified: Secondary | ICD-10-CM

## 2023-11-03 ENCOUNTER — Other Ambulatory Visit: Payer: Self-pay

## 2023-11-03 ENCOUNTER — Ambulatory Visit (INDEPENDENT_AMBULATORY_CARE_PROVIDER_SITE_OTHER): Admitting: Family Medicine

## 2023-11-03 ENCOUNTER — Encounter (INDEPENDENT_AMBULATORY_CARE_PROVIDER_SITE_OTHER): Payer: Self-pay | Admitting: Family Medicine

## 2023-11-03 ENCOUNTER — Other Ambulatory Visit (HOSPITAL_BASED_OUTPATIENT_CLINIC_OR_DEPARTMENT_OTHER): Payer: Self-pay | Admitting: *Deleted

## 2023-11-03 ENCOUNTER — Other Ambulatory Visit (HOSPITAL_COMMUNITY): Payer: Self-pay

## 2023-11-03 VITALS — BP 120/74 | HR 78 | Temp 98.0°F | Ht 67.0 in | Wt 202.0 lb

## 2023-11-03 DIAGNOSIS — R7303 Prediabetes: Secondary | ICD-10-CM

## 2023-11-03 DIAGNOSIS — E65 Localized adiposity: Secondary | ICD-10-CM

## 2023-11-03 DIAGNOSIS — E559 Vitamin D deficiency, unspecified: Secondary | ICD-10-CM

## 2023-11-03 DIAGNOSIS — M542 Cervicalgia: Secondary | ICD-10-CM

## 2023-11-03 DIAGNOSIS — Z0000 Encounter for general adult medical examination without abnormal findings: Secondary | ICD-10-CM

## 2023-11-03 DIAGNOSIS — M25519 Pain in unspecified shoulder: Secondary | ICD-10-CM

## 2023-11-03 DIAGNOSIS — M791 Myalgia, unspecified site: Secondary | ICD-10-CM

## 2023-11-03 DIAGNOSIS — Z6831 Body mass index (BMI) 31.0-31.9, adult: Secondary | ICD-10-CM

## 2023-11-03 DIAGNOSIS — E669 Obesity, unspecified: Secondary | ICD-10-CM

## 2023-11-03 DIAGNOSIS — E538 Deficiency of other specified B group vitamins: Secondary | ICD-10-CM

## 2023-11-03 DIAGNOSIS — Z125 Encounter for screening for malignant neoplasm of prostate: Secondary | ICD-10-CM

## 2023-11-03 DIAGNOSIS — Z6832 Body mass index (BMI) 32.0-32.9, adult: Secondary | ICD-10-CM

## 2023-11-03 MED ORDER — VITAMIN D (ERGOCALCIFEROL) 1.25 MG (50000 UNIT) PO CAPS
50000.0000 [IU] | ORAL_CAPSULE | ORAL | 1 refills | Status: DC
  Filled 2023-11-03: qty 4, 28d supply, fill #0

## 2023-11-03 MED ORDER — CYANOCOBALAMIN 500 MCG PO TABS: 500.0000 ug | ORAL_TABLET | Freq: Every day | ORAL | Status: AC

## 2023-11-03 MED ORDER — METFORMIN HCL ER 500 MG PO TB24
1000.0000 mg | ORAL_TABLET | Freq: Every day | ORAL | 1 refills | Status: DC
  Filled 2023-11-03: qty 60, 30d supply, fill #0

## 2023-11-03 NOTE — Progress Notes (Signed)
 Connor Thomas, D.O.  ABFM, ABOM Specializing in Clinical Bariatric Medicine  Office located at: [address] [place], [place]  [address]      A) FOR THE CHRONIC DISEASE OF OBESITY:  Chief complaint: Obesity Connor Thomas is here to discuss his progress with his obesity treatment plan.   History of present illness / Interval history:  Connor Thomas is here today for his follow-up office visit.  Since last OV on 09/08/2023, pt is down 1 lb.    09/08/23 10:00 11/03/23 11:00   Body Fat % 26.5 % 26.8 %  Muscle Mass (lbs) 142 lbs 141 lbs  Fat Mass (lbs) 53.8 lbs 54.4 lbs  Total Body Water (lbs) 98.8 lbs 99.2 lbs  Visceral Fat Rating  16 16  Counseling done on how various foods will affect these numbers and how to maximize success   Total lbs lost to date: -8 lbs Total Fat Mass in lbs lost to date: -4 lbs Total weight loss percentage to date: -3.81 %   Nutrition Therapy He is on the Category 3 Plan and states he is following his eating plan approximately 75 % of the time.   - Tracking Calories/Macros: no - ***  - Eating More Whole Foods: yes  - Adequate Protein Intake: yes  - Adequate Water Intake: yes  - Skipping Meals: no - ***  - Sleeping 7-9 Hours/ Night: yes   Connor Thomas is currently in the action stage of change. As such, his goal is to continue weight management plan.  He has agreed to: {EMWTLOSSPLAN:29297::continue current plan}   Physical Activity Pt is walking 30  minutes 2 days per week   Connor Thomas has been advised to work up to 300-450 minutes of moderate intensity aerobic activity a week and strengthening exercises 2-3 times per week for cardiovascular health, weight loss maintenance and preservation of muscle mass.  He has agreed to : {EMEXERCISE:28847::Think about enjoyable ways to increase daily physical activity and overcoming barriers to exercise,Increase physical activity in their day and reduce sedentary time (increase NEAT).,Increase volume of  physical activity to a goal of 240 minutes a week,Combine aerobic and strengthening exercises for efficiency and improved cardiometabolic health.}   Behavioral Modifications Evidence-based interventions for health behavior change were utilized today including the discussion of  1) self monitoring techniques:  none 2) problem-solving barriers:  none 3) self care:  none 4) SMART goals for next OV:  Exercise more and eat more on plan foods. Regarding patient's less desirable eating habits and patterns, we employed the technique of small changes.   We discussed the following today: {dowtlossstrategies:31654} Additional resources provided today: {DOhandouts:31655::None}   Medical Interventions/ Pharmacotherapy Previous Bariatric surgery: no Pharmacotherapy for weight loss: He is currently taking Metformin  XR 500 mg daily for medical weight loss.    We discussed various medication options to help Connor Thomas with his weight loss efforts and we both agreed to : {EMagreedrx:29170}   B) OBESITY RELATED CONDITIONS ADDRESSED TODAY:   Prediabetes Assessment & Plan Lab Results  Component Value Date   HGBA1C 5.6 05/14/2023   HGBA1C 5.8 (H) 12/31/2022   HGBA1C 5.7 07/08/2022   INSULIN  7.8 12/31/2022   On Metformin  XR 500 mg twice daily with good compliance and tolerance. He wants to increase dosage to help with hunger. Going to wait for CMP labs before increasing dose, to ensure good kidney function . Will revisit next at next office visit. A1c is controlled. No acute concerns today. Cont decreasing simple carbs,  increase lean protein, and increase exercise as able.   Refill Metformin  today.  Hunger could have been increased due to prednisone  as well.   B12 insufficieny - new onset Assessment & Plan - B12 level is 391, not at goal of over 500.  This diagnosis was reviewed with the patient and education was provided.  Lab Results  Component Value Date   VITAMINB12 391 05/14/2023  Currently  taking OTC B12. Continue prudent nutritional plan and focus on b12 rich foods such as lean red meats; poultry; eggs; seafood; beans, peas, and lentils; nuts and seeds; and soy products. We will continue to monitor as deemed clinically necessary.  Educated pt that B12 can decrease when on Metformin .     Myalgia and arthralgias Assessment & Plan Sees rheumatology for autoimmune disorder, and went on Prednisone  for a month because was experience shoulder and neck pain. Cont to follow up with rheumatology as necessary.    Visceral obesity Assessment & Plan Current visceral fat rating: 16. The visceral fat rating should be  < 10 in a male.  Visceral adipose tissue is a hormonally active component of total body fat. This body composition phenotype is associated with medical disorders such as metabolic syndrome, cardiovascular disease and several malignancies including prostate, breast, and colorectal cancers. Cont losing 7-10% of weight via prudent nutritional plan and lifestyle changes.      Vitamin D  insufficiency Assessment & Plan  Lab Results  Component Value Date   VD25OH 61.8 05/14/2023   VD25OH 39.7 12/31/2022  Currently on        Prediabetes  B12 insufficieny - new onset       There are no discontinued medications.   No orders of the defined types were placed in this encounter.     Follow up:   No follow-ups on file. *** He was informed of the importance of frequent follow up visits to maximize his success with intensive lifestyle modifications for his multiple health conditions.   Weight Summary and Biometrics   Weight Lost Since Last Visit: 1lb  Weight Gained Since Last Visit: 0lb  ***  Vitals Temp: 98 F (36.7 C) BP: 120/74 Pulse Rate: 78 SpO2: 93 %   Anthropometric Measurements Height: 5' 7 (1.702 m) Weight: 202 lb (91.6 kg) BMI (Calculated): 31.63 Weight at Last Visit: 203lb Weight Lost Since Last Visit: 1lb Weight Gained Since Last  Visit: 0lb Starting Weight: 210lb Total Weight Loss (lbs): 8 lb (3.629 kg)   Body Composition  Body Fat %: 26.8 % Fat Mass (lbs): 54.4 lbs Muscle Mass (lbs): 141 lbs Total Body Water (lbs): 99.2 lbs Visceral Fat Rating : 16   Other Clinical Data Fasting: no Labs: no Today's Visit #: 10 Starting Date: 12/31/22    Objective:   PHYSICAL EXAM: Blood pressure 120/74, pulse 78, temperature 98 F (36.7 C), height 5' 7 (1.702 m), weight 202 lb (91.6 kg), SpO2 93%. Body mass index is 31.64 kg/m.  General: he is overweight, cooperative and in no acute distress. PSYCH: Has normal mood, affect and thought process.   HEENT: EOMI, sclerae are anicteric. Lungs: Normal breathing effort, no conversational dyspnea. Extremities: Moves * 4 Neurologic: A and O * 3, good insight  DIAGNOSTIC DATA REVIEWED: BMET    Component Value Date/Time   NA 138 06/06/2023 1525   K 4.5 06/06/2023 1525   CL 98 06/06/2023 1525   CO2 25 06/06/2023 1525   GLUCOSE 85 06/06/2023 1525   GLUCOSE 106 (H) 07/08/2022 9161  GLUCOSE 93 01/27/2006 0917   BUN 17 06/06/2023 1525   CREATININE 0.89 06/06/2023 1525   CREATININE 0.91 12/14/2019 1010   CALCIUM 9.2 06/06/2023 1525   GFRNONAA 92 12/14/2019 1010   GFRAA 107 12/14/2019 1010   Lab Results  Component Value Date   HGBA1C 5.6 05/14/2023   HGBA1C 5.7 10/17/2014   Lab Results  Component Value Date   INSULIN  7.8 12/31/2022   Lab Results  Component Value Date   TSH 1.670 12/31/2022   CBC    Component Value Date/Time   WBC 14.5 (H) 06/18/2023 0851   RBC 4.33 06/18/2023 0851   HGB 12.5 (L) 06/18/2023 0851   HGB 12.8 (L) 06/06/2023 1525   HCT 36.9 (L) 06/18/2023 0851   HCT 39.1 06/06/2023 1525   PLT 444.0 (H) 06/18/2023 0851   PLT 444 06/06/2023 1525   MCV 85.2 06/18/2023 0851   MCV 89 06/06/2023 1525   MCH 29.2 06/06/2023 1525   MCH 30.7 12/14/2019 1010   MCHC 33.8 06/18/2023 0851   RDW 12.9 06/18/2023 0851   RDW 11.7 06/06/2023 1525    Iron Studies No results found for: IRON, TIBC, FERRITIN, IRONPCTSAT Lipid Panel     Component Value Date/Time   CHOL 171 12/31/2022 0936   TRIG 83 12/31/2022 0936   TRIG 119 01/27/2006 0917   HDL 49 12/31/2022 0936   CHOLHDL 4 07/08/2022 0838   VLDL 18.4 07/08/2022 0838   LDLCALC 106 (H) 12/31/2022 0936   LDLCALC 103 (H) 12/14/2019 1010   LDLDIRECT 144.9 03/10/2008 0812   Hepatic Function Panel     Component Value Date/Time   PROT 6.9 05/14/2023 0952   ALBUMIN 4.5 05/14/2023 0952   AST 14 05/14/2023 0952   ALT 14 05/14/2023 0952   ALKPHOS 94 05/14/2023 0952   BILITOT 0.3 05/14/2023 0952   BILIDIR 0.1 07/08/2022 0838   IBILI 0.6 12/14/2019 1010      Component Value Date/Time   TSH 1.670 12/31/2022 0936   Nutritional Lab Results  Component Value Date   VD25OH 61.8 05/14/2023   VD25OH 39.7 12/31/2022    Attestations:   I, ***, acting as a Stage manager for Connor Jenkins, DO., have compiled all relevant documentation for today's office visit on behalf of Connor Jenkins, DO, while in the presence of [employer], DO.  I have spent *** minutes in the care of the patient today including *** minutes face-to-face assessing and reviewing listed medical problems above as outlined in office visit note and providing nutritional and behavioral counseling as outlined in obesity care plan.   I have reviewed the above documentation for accuracy and completeness, and I agree with the above. Connor JINNY Thomas, D.O.  The 21st Century Cures Act was signed into law in 2016 which includes the topic of electronic health records.  This provides immediate access to information in MyChart.  This includes consultation notes, operative notes, office notes, lab results and pathology reports.  If you have any questions about what you read please let us  know at your next visit so we can discuss your concerns and take corrective action if need be.  We are right here with you.

## 2023-11-04 ENCOUNTER — Encounter (INDEPENDENT_AMBULATORY_CARE_PROVIDER_SITE_OTHER): Payer: Self-pay | Admitting: Family Medicine

## 2023-11-04 ENCOUNTER — Ambulatory Visit (HOSPITAL_BASED_OUTPATIENT_CLINIC_OR_DEPARTMENT_OTHER): Payer: Self-pay | Admitting: Family Medicine

## 2023-11-04 LAB — CBC WITH DIFFERENTIAL/PLATELET
Basophils Absolute: 0.1 x10E3/uL (ref 0.0–0.2)
Basos: 1 %
EOS (ABSOLUTE): 0.2 x10E3/uL (ref 0.0–0.4)
Eos: 2 %
Hematocrit: 40.4 % (ref 37.5–51.0)
Hemoglobin: 13.4 g/dL (ref 13.0–17.7)
Immature Grans (Abs): 0 x10E3/uL (ref 0.0–0.1)
Immature Granulocytes: 0 %
Lymphocytes Absolute: 3.2 x10E3/uL — ABNORMAL HIGH (ref 0.7–3.1)
Lymphs: 37 %
MCH: 28.3 pg (ref 26.6–33.0)
MCHC: 33.2 g/dL (ref 31.5–35.7)
MCV: 85 fL (ref 79–97)
Monocytes Absolute: 0.8 x10E3/uL (ref 0.1–0.9)
Monocytes: 9 %
Neutrophils Absolute: 4.4 x10E3/uL (ref 1.4–7.0)
Neutrophils: 51 %
Platelets: 357 x10E3/uL (ref 150–450)
RBC: 4.73 x10E6/uL (ref 4.14–5.80)
RDW: 13.2 % (ref 11.6–15.4)
WBC: 8.7 x10E3/uL (ref 3.4–10.8)

## 2023-11-04 LAB — PSA TOTAL (REFLEX TO FREE): Prostate Specific Ag, Serum: 0.5 ng/mL (ref 0.0–4.0)

## 2023-11-04 LAB — SEDIMENTATION RATE: Sed Rate: 16 mm/h (ref 0–30)

## 2023-11-04 LAB — C-REACTIVE PROTEIN: CRP: 5 mg/L (ref 0–10)

## 2023-11-04 LAB — COMPREHENSIVE METABOLIC PANEL WITH GFR
ALT: 16 IU/L (ref 0–44)
AST: 14 IU/L (ref 0–40)
Albumin: 4.5 g/dL (ref 3.9–4.9)
Alkaline Phosphatase: 91 IU/L (ref 47–123)
BUN/Creatinine Ratio: 15 (ref 10–24)
BUN: 12 mg/dL (ref 8–27)
Bilirubin Total: 0.3 mg/dL (ref 0.0–1.2)
CO2: 23 mmol/L (ref 20–29)
Calcium: 9.6 mg/dL (ref 8.6–10.2)
Chloride: 99 mmol/L (ref 96–106)
Creatinine, Ser: 0.78 mg/dL (ref 0.76–1.27)
Globulin, Total: 2.3 g/dL (ref 1.5–4.5)
Glucose: 98 mg/dL (ref 70–99)
Potassium: 4.6 mmol/L (ref 3.5–5.2)
Sodium: 138 mmol/L (ref 134–144)
Total Protein: 6.8 g/dL (ref 6.0–8.5)
eGFR: 100 mL/min/1.73 (ref >59)

## 2023-11-04 LAB — LIPID PANEL
Chol/HDL Ratio: 3 ratio (ref 0.0–5.0)
Cholesterol, Total: 155 mg/dL (ref 100–199)
HDL: 52 mg/dL (ref >39)
LDL Chol Calc (NIH): 90 mg/dL (ref 0–99)
Triglycerides: 67 mg/dL (ref 0–149)
VLDL Cholesterol Cal: 13 mg/dL (ref 5–40)

## 2023-11-04 LAB — HEMOGLOBIN A1C
Est. average glucose Bld gHb Est-mCnc: 123 mg/dL
Hgb A1c MFr Bld: 5.9 % — ABNORMAL HIGH (ref 4.8–5.6)

## 2023-11-04 LAB — VITAMIN D 25 HYDROXY (VIT D DEFICIENCY, FRACTURES): Vit D, 25-Hydroxy: 81.3 ng/mL (ref 30.0–100.0)

## 2023-11-04 LAB — TSH RFX ON ABNORMAL TO FREE T4: TSH: 1.31 u[IU]/mL (ref 0.450–4.500)

## 2023-11-05 ENCOUNTER — Other Ambulatory Visit: Payer: Self-pay | Admitting: Medical Genetics

## 2023-11-05 ENCOUNTER — Other Ambulatory Visit (HOSPITAL_BASED_OUTPATIENT_CLINIC_OR_DEPARTMENT_OTHER): Payer: Self-pay | Admitting: *Deleted

## 2023-11-05 DIAGNOSIS — E538 Deficiency of other specified B group vitamins: Secondary | ICD-10-CM

## 2023-11-06 LAB — VITAMIN B12: Vitamin B-12: 513 pg/mL (ref 232–1245)

## 2023-11-06 LAB — SPECIMEN STATUS REPORT

## 2023-11-10 ENCOUNTER — Encounter (HOSPITAL_BASED_OUTPATIENT_CLINIC_OR_DEPARTMENT_OTHER): Admitting: Family Medicine

## 2023-11-13 ENCOUNTER — Other Ambulatory Visit (HOSPITAL_COMMUNITY): Payer: Self-pay

## 2023-11-24 ENCOUNTER — Encounter (HOSPITAL_BASED_OUTPATIENT_CLINIC_OR_DEPARTMENT_OTHER): Payer: Self-pay | Admitting: Family Medicine

## 2023-11-24 ENCOUNTER — Other Ambulatory Visit (HOSPITAL_COMMUNITY): Payer: Self-pay

## 2023-11-24 ENCOUNTER — Ambulatory Visit (INDEPENDENT_AMBULATORY_CARE_PROVIDER_SITE_OTHER): Admitting: Family Medicine

## 2023-11-24 VITALS — BP 110/71 | HR 62 | Temp 98.9°F | Resp 18 | Ht 67.0 in | Wt 211.0 lb

## 2023-11-24 DIAGNOSIS — Z87891 Personal history of nicotine dependence: Secondary | ICD-10-CM

## 2023-11-24 DIAGNOSIS — E785 Hyperlipidemia, unspecified: Secondary | ICD-10-CM

## 2023-11-24 DIAGNOSIS — Z0000 Encounter for general adult medical examination without abnormal findings: Secondary | ICD-10-CM | POA: Insufficient documentation

## 2023-11-24 MED ORDER — OMEPRAZOLE 20 MG PO CPDR
20.0000 mg | DELAYED_RELEASE_CAPSULE | Freq: Every day | ORAL | 3 refills | Status: AC
  Filled 2023-11-24: qty 30, 30d supply, fill #0
  Filled 2023-12-30: qty 30, 30d supply, fill #1
  Filled 2024-01-30: qty 30, 30d supply, fill #2
  Filled 2024-02-18: qty 30, 30d supply, fill #3

## 2023-11-24 MED ORDER — ALBUTEROL SULFATE HFA 108 (90 BASE) MCG/ACT IN AERS
2.0000 | INHALATION_SPRAY | RESPIRATORY_TRACT | 5 refills | Status: AC | PRN
  Filled 2023-11-24: qty 6.7, 30d supply, fill #0

## 2023-11-24 MED ORDER — MELOXICAM 7.5 MG PO TABS
7.5000 mg | ORAL_TABLET | Freq: Two times a day (BID) | ORAL | 1 refills | Status: DC
  Filled 2023-11-24: qty 42, 21d supply, fill #0

## 2023-11-24 MED ORDER — FLUTICASONE PROPIONATE 50 MCG/ACT NA SUSP
NASAL | 2 refills | Status: AC
  Filled 2023-11-24: qty 16, 30d supply, fill #0

## 2023-11-24 MED ORDER — ACYCLOVIR 200 MG PO CAPS
ORAL_CAPSULE | ORAL | 3 refills | Status: AC
  Filled 2023-11-24: qty 100, 20d supply, fill #0

## 2023-11-24 MED ORDER — SIMVASTATIN 40 MG PO TABS
40.0000 mg | ORAL_TABLET | Freq: Every day | ORAL | 3 refills | Status: AC
  Filled 2023-11-24: qty 30, 30d supply, fill #0
  Filled 2023-12-30: qty 30, 30d supply, fill #1
  Filled 2024-01-30: qty 30, 30d supply, fill #2
  Filled 2024-02-18: qty 30, 30d supply, fill #3

## 2023-11-24 MED ORDER — CITALOPRAM HYDROBROMIDE 20 MG PO TABS
30.0000 mg | ORAL_TABLET | Freq: Every day | ORAL | 1 refills | Status: AC
  Filled 2023-11-24: qty 45, 30d supply, fill #0
  Filled 2023-12-30: qty 45, 30d supply, fill #1
  Filled 2024-01-30: qty 45, 30d supply, fill #2
  Filled 2024-02-18: qty 45, 30d supply, fill #3

## 2023-11-24 NOTE — Progress Notes (Signed)
 Subjective:    CC: Annual Physical Exam  HPI: Connor Thomas is a 63 y.o. presenting for annual physical  I reviewed the past medical history, family history, social history, surgical history, and allergies today and no changes were needed.  Please see the problem list section below in epic for further details.  Past Medical History: Past Medical History:  Diagnosis Date   Allergy    Seasonal   Anxiety    Mild   Arthritis    shoulders,ankles,lower back   Asthma    as a child   Back pain    Cancer (HCC)    melanoma   Depression    Mild   Edema    Ganglion of right wrist    GERD (gastroesophageal reflux disease)    Med   Hernia, inguinal    Hip pain    Hyperlipidemia    Melanoma in situ of nose (HCC) 11/2012   Rotator cuff tear    LEFT   Shoulder pain    Past Surgical History: Past Surgical History:  Procedure Laterality Date   COLONOSCOPY  06/25/2021   per Dr. Leigh, benign polyps, repeat in 7 to 10 yrs   ESOPHAGOGASTRODUODENOSCOPY  04/25/2006   HERNIA REPAIR  2015   Abdomen   left knee scope     POLYPECTOMY     TONSILLECTOMY AND ADENOIDECTOMY     UMBILICAL HERNIA REPAIR N/A 03/25/2012   Procedure: HERNIA REPAIR-- UMBILICAL-- ADULT;  Surgeon: Donnice KATHEE Lunger, MD;  [facility]:  [facility];  Service: General;  Laterality: N/A;   UMBILICAL HERNIA REPAIR N/A 05/16/2016   Procedure: LAPAROSCOPIC ASSISTED REPAIR OF RECURRENT UMBILICAL HERNIA WITH MESH;  Surgeon: Donnice Lunger, MD;  Location: [facility];  Service: General;  Laterality: N/A;   Social History: Social History   Socioeconomic History   Marital status: Married    Spouse name: Not on file   Number of children: Not on file   Years of education: Not on file   Highest education level: Bachelor's degree (e.g., BA, AB, BS)  Occupational History   Not on file  Tobacco Use   Smoking status: Former    Current packs/day: 0.00    Types: Cigarettes    Quit date: 01/08/2018    Years since  quitting: 5.8    Passive exposure: Never   Smokeless tobacco: Never  Vaping Use   Vaping status: Never Used  Substance and Sexual Activity   Alcohol use: Not Currently    Alcohol/week: 12.0 standard drinks of alcohol   Drug use: No   Sexual activity: Yes  Other Topics Concern   Not on file  Social History Narrative   Not on file   Social Drivers of Health   Financial Resource Strain: Low Risk  (11/20/2023)   Overall Financial Resource Strain (CARDIA)    Difficulty of Paying Living Expenses: Not hard at all  Food Insecurity: No Food Insecurity (11/20/2023)   Hunger Vital Sign    Worried About Running Out of Food in the Last Year: Never true    Ran Out of Food in the Last Year: Never true  Transportation Needs: No Transportation Needs (11/20/2023)   PRAPARE - Administrator, Civil Service (Medical): No    Lack of Transportation (Non-Medical): No  Physical Activity: Insufficiently Active (11/20/2023)   Exercise Vital Sign    Days of Exercise per Week: 2 days    Minutes of Exercise per Session: 30 min  Stress: Stress Concern Present (  11/20/2023)   [employer] - Occupational Stress Questionnaire    Feeling of Stress: To some extent  Social Connections: Socially Integrated (11/20/2023)   Social Connection and Isolation Panel    Frequency of Communication with Friends and Family: More than three times a week    Frequency of Social Gatherings with Friends and Family: Twice a week    Attends Religious Services: 1 to 4 times per year    Active Member of [employer] or Organizations: Yes    Attends Engineer, Structural: 1 to 4 times per year    Marital Status: Married   Family History: Family History  Problem Relation Age of Onset   Hypertension Mother    Hyperlipidemia Mother    Depression Mother    Anxiety disorder Mother    Obesity Mother    Pancreatic cancer Father    Hyperlipidemia Father    Heart disease Father    Cancer  Father    Depression Father    Anxiety disorder Father    Sleep apnea Father    Obesity Father    Arthritis Father    Colon cancer Neg Hx    Colon polyps Neg Hx    Crohn's disease Neg Hx    Esophageal cancer Neg Hx    Rectal cancer Neg Hx    Stomach cancer Neg Hx    Allergies: Allergies  Allergen Reactions   Other    Medications: See med rec.  Review of Systems: No headache, visual changes, nausea, vomiting, diarrhea, constipation, dizziness, abdominal pain, skin rash, fevers, chills, night sweats, swollen lymph nodes, weight loss, chest pain, body aches, joint swelling, muscle aches, shortness of breath, mood changes, visual or auditory hallucinations.  Objective:    BP 110/71 (BP Location: Left Arm, Patient Position: Sitting, Cuff Size: Normal)   Pulse 62   Temp 98.9 F (37.2 C) (Oral)   Resp 18   Ht 5' 7 (1.702 m)   Wt 211 lb (95.7 kg)   SpO2 96%   BMI 33.05 kg/m   General: Well Developed, well nourished, and in no acute distress.  Neuro: Alert and oriented x3, extra-ocular muscles intact, sensation grossly intact. Cranial nerves II through XII are intact, motor, sensory, and coordinative functions are all intact. HEENT: Normocephalic, atraumatic, pupils equal round reactive to light, neck supple, no masses, no lymphadenopathy, thyroid  nonpalpable. Oropharynx, nasopharynx, external ear canals are unremarkable. Skin: Warm and dry, no rashes noted.  Cardiac: Regular rate and rhythm, no murmurs rubs or gallops.  Respiratory: Clear to auscultation bilaterally. Not using accessory muscles, speaking in full sentences. Abdominal: Soft, nontender, nondistended, positive bowel sounds, no masses, no organomegaly. Musculoskeletal: Shoulder, elbow, wrist, hip, knee, ankle stable, and with full range of motion.  Impression and Recommendations:    Wellness examination Assessment & Plan: Routine HCM labs reviewed. HCM reviewed/discussed. Anticipatory guidance regarding healthy  weight, lifestyle and choices given. Recommend healthy diet.  Recommend approximately 150 minutes/week of moderate intensity exercise Recommend regular dental and vision exams Always use seatbelt/lap and shoulder restraints Recommend using smoke alarms and checking batteries at least twice a year Recommend using sunscreen when outside Discussed colon cancer screening recommendations, options.  Patient is UTD Discussed immunization recommendations   History of cigarette smoking -     CT CHEST LUNG CANCER SCREENING LOW DOSE WO CONTRAST; Future  Hyperlipidemia, unspecified hyperlipidemia type Assessment & Plan: Patient continues with simvastatin  40 mg daily.  Denies any current issues with medication.  He has  questions about additional assessment of hyperlipidemia, impacts on heart health. We did discuss potential CAC scoring.  He would prefer to hold off on this for now, can consider in the future.   Other orders -     Acyclovir ; TAKE 1 CAPSULE BY MOUTH FIVE TIMES DAILY  Dispense: 100 capsule; Refill: 3 -     Albuterol  Sulfate HFA; Inhale 2 puffs into the lungs every 4 (four) hours as needed for wheezing or shortness of breath.  Dispense: 6.7 g; Refill: 5 -     Citalopram  Hydrobromide; Take 1.5 tablets (30 mg total) by mouth daily.  Dispense: 135 tablet; Refill: 1 -     Fluticasone  Propionate; Instill 1 spray into each nostril following sinus rinses twice daily  Dispense: 16 g; Refill: 2 -     Meloxicam ; Take 1 tablet (7.5 mg total) by mouth 2 (two) times daily for 21 days  Dispense: 42 tablet; Refill: 1 -     Omeprazole ; Take 1 capsule (20 mg total) by mouth daily.  Dispense: 90 capsule; Refill: 3 -     Simvastatin ; Take 1 tablet (40 mg total) by mouth daily.  Dispense: 90 tablet; Refill: 3  Return in about 1 year (around 11/23/2024) for CPE.   ___________________________________________ Vannie Hilgert de [place], MD, ABFM, CAQSM [facility]

## 2023-11-24 NOTE — Assessment & Plan Note (Signed)

## 2023-11-24 NOTE — Assessment & Plan Note (Signed)
 Patient continues with simvastatin  40 mg daily.  Denies any current issues with medication.  He has questions about additional assessment of hyperlipidemia, impacts on heart health. We did discuss potential CAC scoring.  He would prefer to hold off on this for now, can consider in the future.

## 2023-11-24 NOTE — Progress Notes (Signed)
 Office Visit Note  Patient: Connor Thomas             Date of Birth: 1960/04/18           MRN: [id number]             PCP: de [place], Raymond J, MD Referring: [name] Garnette LABOR, MD Visit Date: 11/25/2023 Occupation: Data Unavailable  Subjective:  New Patient (Initial Visit) (05/16/2023 sore spot in his head traveling down his neck across his shoulders, chest , causing fatigue an low fever was taking alka seltzer cold plus. Noticed swelling in the left ankle over time. Was prescribed prednisone  an felt relief for a brief moment an then it returned an is worse on some days, PCP said it sounded like PMR recommended he see Rheum. Patient was real active In sports at one point. 11/19/2023 seen eye Dr for pain in left eye as well.) and Abnormal Lab (Dr Jordan she did blood work that was all over the place)    Discussed the use of AI scribe software for clinical note transcription with the patient, who gave verbal consent to proceed.  History of Present Illness   Connor Thomas is a 63 year old male with suspected polymyalgia rheumatica who presents with increased joint pain and stiffness.  He has experienced increased pain and stiffness, particularly in the neck and shoulders, with grinding sensations. Intermittent low-grade fevers and elevated inflammatory markers were noted earlier this year. A flare-up in early September led to a tapering course of prednisone , which resolved the symptoms, but he did not have any follow-up after this treatment.  Recently, he experienced dull pain in the back of his left eye for about five days, which was more annoying than severe. An eye doctor prescribed a short course of prednisone  starting at 20 mg and tapering to 10 mg and then 5 mg, which alleviated the eye symptoms.  He has been on prednisone  multiple times since May, with significant improvement in symptoms each time. Initially, he managed his symptoms with Tylenol  and ibuprofen until starting prednisone  on May  16, which provided relief within 24 hours. He reports a history of fatigue associated with his symptoms, describing it as a significant issue. Engaging in light exercise and stretching has been beneficial, although it leads to increased fatigue requiring naps.  No new rashes, swelling, or side effects from prednisone . He has a history of back issues from an old injury, which he initially thought was related to his current symptoms. Imaging studies have shown arthritis but no need for surgical intervention.   Activities of Daily Living:  Patient reports morning stiffness for 30 minutes.   Patient Denies nocturnal pain.  Difficulty dressing/grooming: Denies Difficulty climbing stairs: Denies Difficulty getting out of chair: Reports Difficulty using hands for taps, buttons, cutlery, and/or writing: Denies  Review of Systems  Constitutional:  Positive for fatigue.  HENT:  Positive for mouth dryness. Negative for mouth sores.   Eyes:  Positive for dryness.  Respiratory:  Positive for shortness of breath.   Cardiovascular:  Negative for chest pain and palpitations.  Gastrointestinal:  Negative for blood in stool, constipation and diarrhea.  Endocrine: Negative for increased urination.  Genitourinary:  Negative for involuntary urination.  Musculoskeletal:  Positive for joint pain, joint pain, joint swelling, myalgias, muscle weakness, morning stiffness, muscle tenderness and myalgias. Negative for gait problem.  Skin:  Negative for color change, rash, hair loss and sensitivity to sunlight.  Allergic/Immunologic: Negative for susceptible to  infections.  Neurological:  Negative for dizziness and headaches.  Hematological:  Negative for swollen glands.  Psychiatric/Behavioral:  Positive for depressed mood. Negative for sleep disturbance. The patient is nervous/anxious.     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   COVID-19 virus infection 09/30/2018   S/P repair of recurrent ventral hernia April 2018 05/16/2016   Elevated blood sugar 10/17/2014   Hematuria of undiagnosed cause 10/17/2014   Hyperlipidemia 07/27/2012   Depression 07/27/2012   Allergic rhinitis 09/12/2006   GERD 09/12/2006    Past Medical History:  Diagnosis Date   Allergy    Seasonal   Anxiety    Mild   Arthritis    shoulders,ankles,lower back   Asthma    as a child   Back pain    Cancer (HCC)    melanoma   Depression    Mild   Edema    Ganglion of right wrist    GERD (gastroesophageal reflux disease)    Med   Hernia, inguinal    Hip pain    Hyperlipidemia    Melanoma in situ of nose (HCC) 11/2012   Rotator cuff tear    LEFT   Shoulder pain     Family History  Problem Relation Age of Onset   Hypertension Mother    Hyperlipidemia Mother    Depression Mother    Anxiety disorder Mother    Obesity Mother    Pancreatic cancer Father    Hyperlipidemia Father    Heart disease Father    Cancer Father    Depression Father    Anxiety disorder Father    Sleep apnea Father    Obesity Father    Arthritis Father    Sleep apnea Sister    Colon cancer Neg Hx    Colon polyps Neg Hx    Crohn's disease Neg Hx    Esophageal cancer Neg Hx    Rectal cancer Neg Hx    Stomach cancer Neg Hx    Past Surgical History:  Procedure Laterality Date   ANKLE SURGERY Left 1992   COLONOSCOPY  06/25/2021   per Dr. Leigh, benign polyps, repeat in 7 to 10 yrs   ESOPHAGOGASTRODUODENOSCOPY  04/25/2006   HERNIA REPAIR  2015   Abdomen   left knee scope     POLYPECTOMY     TONSILLECTOMY AND ADENOIDECTOMY     UMBILICAL HERNIA REPAIR N/A 03/25/2012   Procedure: HERNIA REPAIR--  UMBILICAL-- ADULT;  Surgeon: Donnice KATHEE Lunger, MD;  Location: [facility];  Service: General;  Laterality: N/A;   UMBILICAL HERNIA REPAIR N/A 05/16/2016   Procedure: LAPAROSCOPIC ASSISTED REPAIR OF RECURRENT UMBILICAL HERNIA WITH MESH;  Surgeon: Donnice Lunger, MD;  Location: [facility];  Service: General;  Laterality: N/A;   Social History   Tobacco Use   Smoking status: Former    Current packs/day: 0.00    Types: Cigarettes    Quit date: 01/08/2018    Years since quitting: 5.9    Passive exposure: Past   Smokeless tobacco: Never  Vaping Use   Vaping status: Never Used  Substance Use Topics   Alcohol use: Not Currently    Alcohol/week: 12.0 standard drinks of alcohol  Drug use: No   Social History   Social History Narrative   Not on file     Immunization History  Administered Date(s) Administered   Influenza,inj,Quad PF,6+ Mos 09/17/2013, 10/17/2014, 10/23/2015, 10/03/2017, 11/23/2018, 12/14/2019   Moderna Sars-Covid-2 Vaccination 04/08/2019, 05/11/2019   Td 01/22/2000   Tdap 05/01/2010, 07/08/2022   Zoster Recombinant(Shingrix) 01/23/2021, 07/25/2021     Objective: Vital Signs: BP 105/61 (BP Location: Left Arm, Patient Position: Sitting, Cuff Size: Normal)   Pulse 68   Temp 97.9 F (36.6 C)   Resp 16   Ht 5' 7.75 (1.721 m)   Wt 213 lb (96.6 kg)   BMI 32.63 kg/m    Physical Exam Eyes:     Conjunctiva/sclera: Conjunctivae normal.  Cardiovascular:     Rate and Rhythm: Normal rate and regular rhythm.  Pulmonary:     Effort: Pulmonary effort is normal.     Breath sounds: Normal breath sounds.  Lymphadenopathy:     Cervical: No cervical adenopathy.  Skin:    General: Skin is warm and dry.  Neurological:     Mental Status: He is alert.  Psychiatric:        Mood and Affect: Mood normal.      Musculoskeletal Exam:  Shoulders full ROM no tenderness or swelling Elbows full ROM no tenderness or swelling Wrists full ROM no tenderness or  swelling Fingers full ROM no tenderness or swelling, dupuytren's contracture without tenderness Knees full ROM no tenderness or swelling Ankles full ROM no tenderness or swelling    Investigation: No additional findings.  Imaging: No results found.  Recent Labs: Lab Results  Component Value Date   WBC 8.7 11/03/2023   HGB 13.4 11/03/2023   PLT 357 11/03/2023   NA 138 11/03/2023   K 4.6 11/03/2023   CL 99 11/03/2023   CO2 23 11/03/2023   GLUCOSE 98 11/03/2023   BUN 12 11/03/2023   CREATININE 0.78 11/03/2023   BILITOT 0.3 11/03/2023   ALKPHOS 91 11/03/2023   AST 14 11/03/2023   ALT 16 11/03/2023   PROT 6.8 11/03/2023   ALBUMIN 4.5 11/03/2023   CALCIUM 9.6 11/03/2023   GFRAA 107 12/14/2019    Speciality Comments: No specialty comments available.  Procedures:  No procedures performed Allergies: Patient has no known allergies.   Assessment / Plan:     Visit Diagnoses: PMR (polymyalgia rheumatica) Suspected PMR with pain, stiffness, and elevated inflammatory markers. Responded well to prednisone . Differential includes rheumatoid arthritis and other inflammatory conditions. Discussed potential for giant cell arteritis and monitoring for symptoms. Explained autoimmune nature and predilection for large proximal joints. Emphasized symptom monitoring and steroid tapering. - Order blood tests for rheumatoid arthritis, sed rate, and protein electrophoresis after a month off steroids. - Monitor for symptoms of giant cell arteritis, including vision changes and jaw pain. - Consider a more gradual steroid taper if symptoms recur.  Osteoarthritis Mild osteoarthritis contributing to joint pain. Prednisone  may provide relief similar to NSAIDs. Discussed importance of flexibility and weight management.  Dupuytren's contracture, mild Mild Dupuytren's contracture with palmar fascia thickening. - Provide educational material on Dupuytren's contracture.        Orders: No orders of  the defined types were placed in this encounter.  No orders of the defined types were placed in this encounter.   Follow-Up Instructions: No follow-ups on file.   Lonni LELON Ester, MD  Note - This record has been created using [employer].  Chart creation errors have been sought, but may  not always  have been located. Such creation errors do not reflect on  the standard of medical care.

## 2023-11-25 ENCOUNTER — Encounter: Payer: Self-pay | Admitting: Internal Medicine

## 2023-11-25 ENCOUNTER — Ambulatory Visit: Attending: Internal Medicine | Admitting: Internal Medicine

## 2023-11-25 ENCOUNTER — Encounter (HOSPITAL_BASED_OUTPATIENT_CLINIC_OR_DEPARTMENT_OTHER): Payer: Self-pay | Admitting: Family Medicine

## 2023-11-25 VITALS — BP 105/61 | HR 68 | Temp 97.9°F | Resp 16 | Ht 67.75 in | Wt 213.0 lb

## 2023-11-25 DIAGNOSIS — M720 Palmar fascial fibromatosis [Dupuytren]: Secondary | ICD-10-CM

## 2023-11-25 DIAGNOSIS — M353 Polymyalgia rheumatica: Secondary | ICD-10-CM

## 2023-11-25 DIAGNOSIS — R899 Unspecified abnormal finding in specimens from other organs, systems and tissues: Secondary | ICD-10-CM | POA: Diagnosis not present

## 2023-11-25 DIAGNOSIS — R7303 Prediabetes: Secondary | ICD-10-CM

## 2023-11-25 DIAGNOSIS — E785 Hyperlipidemia, unspecified: Secondary | ICD-10-CM

## 2023-11-25 DIAGNOSIS — E66811 Obesity, class 1: Secondary | ICD-10-CM

## 2023-11-25 DIAGNOSIS — Z87891 Personal history of nicotine dependence: Secondary | ICD-10-CM

## 2023-11-25 NOTE — Telephone Encounter (Signed)
 Please see mychart message sent by pt and advise.

## 2023-12-01 ENCOUNTER — Ambulatory Visit (INDEPENDENT_AMBULATORY_CARE_PROVIDER_SITE_OTHER): Payer: Self-pay | Admitting: Family Medicine

## 2023-12-01 ENCOUNTER — Encounter: Payer: Self-pay | Admitting: Internal Medicine

## 2023-12-01 ENCOUNTER — Other Ambulatory Visit (HOSPITAL_COMMUNITY): Payer: Self-pay

## 2023-12-01 ENCOUNTER — Encounter (INDEPENDENT_AMBULATORY_CARE_PROVIDER_SITE_OTHER): Payer: Self-pay | Admitting: Family Medicine

## 2023-12-01 VITALS — BP 109/69 | HR 67 | Temp 98.4°F | Ht 67.0 in | Wt 207.0 lb

## 2023-12-01 DIAGNOSIS — M2550 Pain in unspecified joint: Secondary | ICD-10-CM

## 2023-12-01 DIAGNOSIS — M791 Myalgia, unspecified site: Secondary | ICD-10-CM

## 2023-12-01 DIAGNOSIS — E65 Localized adiposity: Secondary | ICD-10-CM

## 2023-12-01 DIAGNOSIS — E559 Vitamin D deficiency, unspecified: Secondary | ICD-10-CM

## 2023-12-01 DIAGNOSIS — Z6832 Body mass index (BMI) 32.0-32.9, adult: Secondary | ICD-10-CM

## 2023-12-01 DIAGNOSIS — M353 Polymyalgia rheumatica: Secondary | ICD-10-CM

## 2023-12-01 DIAGNOSIS — Z6831 Body mass index (BMI) 31.0-31.9, adult: Secondary | ICD-10-CM

## 2023-12-01 DIAGNOSIS — R7303 Prediabetes: Secondary | ICD-10-CM | POA: Diagnosis not present

## 2023-12-01 DIAGNOSIS — E669 Obesity, unspecified: Secondary | ICD-10-CM

## 2023-12-01 MED ORDER — VITAMIN D (ERGOCALCIFEROL) 1.25 MG (50000 UNIT) PO CAPS
50000.0000 [IU] | ORAL_CAPSULE | ORAL | 1 refills | Status: DC
  Filled 2023-12-01: qty 4, 28d supply, fill #0
  Filled 2023-12-30: qty 4, 28d supply, fill #1

## 2023-12-01 MED ORDER — METFORMIN HCL ER 500 MG PO TB24
1500.0000 mg | ORAL_TABLET | Freq: Every day | ORAL | 1 refills | Status: DC
  Filled 2023-12-01: qty 90, 30d supply, fill #0
  Filled 2023-12-30: qty 90, 30d supply, fill #1

## 2023-12-01 NOTE — Progress Notes (Signed)
 [name], D.O.  ABFM, ABOM Specializing in Clinical Bariatric Medicine  Office located at: [address] [place], [place]  [address]      A) FOR THE CHRONIC DISEASE OF OBESITY:  Chief complaint: Obesity Connor Thomas is here to discuss his progress with his obesity treatment plan.   History of present illness / Interval history:  Connor Thomas is here today for his follow-up office visit.  Since last OV on 11/03/2023, pt is up 5 lbs.   Has been very busy and travelling, on the road, and is a little bit off plan.  Reports his biggest challenge is portion control. When working he usually goes to a steak house.   11/03/23 11:00 12/01/23 11:00   Body Fat % 26.8 % 28 %  Muscle Mass (lbs) 141 lbs 142.2 lbs  Fat Mass (lbs) 54.4 lbs 58.2 lbs  Total Body Water (lbs) 99.2 lbs 99.2 lbs  Visceral Fat Rating  16 16   Counseling done on how various foods will affect these numbers and how to maximize success   Total lbs lost to date: -3 lbs Total Fat Mass in lbs lost to date: -0.2 Total weight loss percentage to date: -1.43 %   Nutrition Therapy He is on the Category 3 Plan and states he is following his eating plan approximately 50 % of the time.   - Tracking Calories/Macros: yes  - Eating More Whole Foods: yes  - Adequate Protein Intake: yes  - Adequate Water Intake: no - ***  - Skipping Meals: no - ***  - Sleeping 7-9 Hours/ Night: yes   Ripley is currently in the action stage of change. As such, his goal is to continue weight management plan.  He has agreed to: switch to journaling 1350-1450 calories and 90++ grams of protein per day.   Physical Activity Pt is lifting and stretching 15  minutes 7 days per week and doing cardio 30 minutes 7 days per week   Cadin has been advised to work up to 300-450 minutes of moderate intensity aerobic activity a week and strengthening exercises 2-3 times per week for cardiovascular health, weight loss maintenance and preservation  of muscle mass.  He has agreed to : Think about enjoyable ways to increase daily physical activity and overcoming barriers to exercise, Increase physical activity in their day and reduce sedentary time (increase NEAT)., Increase volume of physical activity to a goal of 240 minutes a week, and Combine aerobic and strengthening exercises for efficiency and improved cardiometabolic health.   Behavioral Modifications Evidence-based interventions for health behavior change were utilized today including the discussion of  1) self monitoring techniques:  Weighing food at home, being aware of portions, journaling 2) problem-solving barriers:  Has been travelling more recently  3) self care:  exercise and journal 4) SMART goals for next OV:  Journal more Regarding patient's less desirable eating habits and patterns, we employed the technique of small changes.   We discussed the following today: increasing lean protein intake to established goals, avoiding skipping meals, increasing water intake , work on tracking and journaling calories using tracking application, work on managing stress, creating time for self-care and relaxation, and continue to work on maintaining a reduced calorie state, getting the recommended amount of protein, incorporating whole foods, making healthy choices, staying well hydrated and practicing mindfulness when eating. Additional resources provided today: Handout on Daily Food Journaling Log   Medical Interventions/ Pharmacotherapy Previous Bariatric surgery: none Pharmacotherapy for weight  loss: He is currently taking Metformin  XR twice daily for medical weight loss.    We discussed various medication options to help Jordy with his weight loss efforts and we both agreed to : See Pre-DM note.   B) OBESITY RELATED CONDITIONS ADDRESSED TODAY:  BMI 32.0-32.9,adult starting BMI 32.88 BMI 31.0-31.9,adult Current BMI 32.41   Prediabetes Assessment & Plan Lab Results   Component Value Date   HGBA1C 5.9 (H) 11/03/2023   HGBA1C 5.6 05/14/2023   HGBA1C 5.8 (H) 12/31/2022   INSULIN  7.8 12/31/2022   On Metformin  XR 500 mg twice daily with good compliance and tolerance. Reports having increased hunger and cravings. He is currently taking Prednisone  which can also increase hunger. Reviewed labs with pt from 11/03/2023, A1c has worsened from 5.6 on 05/14/2023 to 5.9 on 11/03/2023. No acute concerns today. Increase Metformin  XR 500 mg three times daily due to increased hunger and cravings.   Cont decreasing simple carbs, increase lean protein, and increase exercise as able     Visceral obesity Assessment & Plan Current visceral fat rating: 16.  The visceral fat rating should be < 10 in a male.  Visceral adipose tissue is a hormonally active component of total body fat. This body composition phenotype is associated with medical disorders such as metabolic syndrome, cardiovascular disease and several malignancies including prostate, breast, and colorectal cancers. Cont losing 7-10% of weight via prudent nutritional plan and lifestyle changes.     Vitamin D  insufficiency Assessment & Plan Lab Results  Component Value Date   VD25OH 81.3 11/03/2023   VD25OH 61.8 05/14/2023   VD25OH 39.7 12/31/2022  Currently prescribed Ergo 50,000 units once weekly. Has not been taking Vit D, because pharmacy did not refill it. Vit D levels are at goal today. No acute concerns. Cont regimen(refill today) and recheck levels in 3 months.       Myalgia & Arthralgia, unspecified joint Assessment & Plan Managed by rheumatology for autoimmune disorder. Is currently on Prednisone  due to increased joint pain. Cont to follow up with rheumatology as necessary.    There are no discontinued medications.   No orders of the defined types were placed in this encounter.     Follow up:   No follow-ups on file. *** He was informed of the importance of frequent follow up visits to  maximize his success with intensive lifestyle modifications for his multiple health conditions.   Weight Summary and Biometrics   Weight Lost Since Last Visit: 0lb  Weight Gained Since Last Visit: 5lb  ***  Vitals Temp: 98.4 F (36.9 C) BP: 109/69 Pulse Rate: 67 SpO2: 97 %   Anthropometric Measurements Height: 5' 7 (1.702 m) Weight: 207 lb (93.9 kg) BMI (Calculated): 32.41 Weight at Last Visit: 202lb Weight Lost Since Last Visit: 0lb Weight Gained Since Last Visit: 5lb Starting Weight: 210lb Total Weight Loss (lbs): 3 lb (1.361 kg)   Body Composition  Body Fat %: 28 % Fat Mass (lbs): 58.2 lbs Muscle Mass (lbs): 142.2 lbs Total Body Water (lbs): 99.2 lbs Visceral Fat Rating : 16   Other Clinical Data Fasting: no Labs: no Today's Visit #: 11 Starting Date: 12/31/22    Objective:   PHYSICAL EXAM: Blood pressure 109/69, pulse 67, temperature 98.4 F (36.9 C), height 5' 7 (1.702 m), weight 207 lb (93.9 kg), SpO2 97%. Body mass index is 32.42 kg/m.  General: he is overweight, cooperative and in no acute distress. PSYCH: Has normal mood, affect and thought process.   HEENT:  EOMI, sclerae are anicteric. Lungs: Normal breathing effort, no conversational dyspnea. Extremities: Moves * 4 Neurologic: A and O * 3, good insight  DIAGNOSTIC DATA REVIEWED: BMET    Component Value Date/Time   NA 138 11/03/2023 0933   K 4.6 11/03/2023 0933   CL 99 11/03/2023 0933   CO2 23 11/03/2023 0933   GLUCOSE 98 11/03/2023 0933   GLUCOSE 106 (H) 07/08/2022 0838   GLUCOSE 93 01/27/2006 0917   BUN 12 11/03/2023 0933   CREATININE 0.78 11/03/2023 0933   CREATININE 0.91 12/14/2019 1010   CALCIUM 9.6 11/03/2023 0933   GFRNONAA 92 12/14/2019 1010   GFRAA 107 12/14/2019 1010   Lab Results  Component Value Date   HGBA1C 5.9 (H) 11/03/2023   HGBA1C 5.7 10/17/2014   Lab Results  Component Value Date   INSULIN  7.8 12/31/2022   Lab Results  Component Value Date   TSH  1.310 11/03/2023   CBC    Component Value Date/Time   WBC 8.7 11/03/2023 0933   WBC 14.5 (H) 06/18/2023 0851   RBC 4.73 11/03/2023 0933   RBC 4.33 06/18/2023 0851   HGB 13.4 11/03/2023 0933   HCT 40.4 11/03/2023 0933   PLT 357 11/03/2023 0933   MCV 85 11/03/2023 0933   MCH 28.3 11/03/2023 0933   MCH 30.7 12/14/2019 1010   MCHC 33.2 11/03/2023 0933   MCHC 33.8 06/18/2023 0851   RDW 13.2 11/03/2023 0933   Iron Studies No results found for: IRON, TIBC, FERRITIN, IRONPCTSAT Lipid Panel     Component Value Date/Time   CHOL 155 11/03/2023 0933   TRIG 67 11/03/2023 0933   TRIG 119 01/27/2006 0917   HDL 52 11/03/2023 0933   CHOLHDL 3.0 11/03/2023 0933   CHOLHDL 4 07/08/2022 0838   VLDL 18.4 07/08/2022 0838   LDLCALC 90 11/03/2023 0933   LDLCALC 103 (H) 12/14/2019 1010   LDLDIRECT 144.9 03/10/2008 0812   Hepatic Function Panel     Component Value Date/Time   PROT 6.8 11/03/2023 0933   ALBUMIN 4.5 11/03/2023 0933   AST 14 11/03/2023 0933   ALT 16 11/03/2023 0933   ALKPHOS 91 11/03/2023 0933   BILITOT 0.3 11/03/2023 0933   BILIDIR 0.1 07/08/2022 0838   IBILI 0.6 12/14/2019 1010      Component Value Date/Time   TSH 1.310 11/03/2023 0933   TSH 1.670 12/31/2022 0936   Nutritional Lab Results  Component Value Date   VD25OH 81.3 11/03/2023   VD25OH 61.8 05/14/2023   VD25OH 39.7 12/31/2022    Attestations:   I, ***, acting as a stage manager for Barnie Jenkins, DO., have compiled all relevant documentation for today's office visit on behalf of Barnie Jenkins, DO, while in the presence of [employer], DO.  I have spent *** minutes in the care of the patient today including *** minutes face-to-face assessing and reviewing listed medical problems above as outlined in office visit note and providing nutritional and behavioral counseling as outlined in obesity care plan.   I have reviewed the above documentation for accuracy and completeness, and I agree  with the above. Barnie JINNY Jenkins, D.O.  The 21st Century Cures Act was signed into law in 2016 which includes the topic of electronic health records.  This provides immediate access to information in MyChart.  This includes consultation notes, operative notes, office notes, lab results and pathology reports.  If you have any questions about what you read please let us  know at your next visit so  we can discuss your concerns and take corrective action if need be.  We are right here with you.

## 2023-12-03 DIAGNOSIS — M720 Palmar fascial fibromatosis [Dupuytren]: Secondary | ICD-10-CM | POA: Insufficient documentation

## 2023-12-05 ENCOUNTER — Other Ambulatory Visit (HOSPITAL_COMMUNITY): Payer: Self-pay

## 2023-12-05 MED ORDER — PREDNISONE 5 MG PO TABS
ORAL_TABLET | ORAL | 0 refills | Status: DC
  Filled 2023-12-05: qty 49, 28d supply, fill #0
  Filled 2023-12-30: qty 49, 28d supply, fill #1
  Filled 2024-01-30: qty 41, 20d supply, fill #1

## 2023-12-05 NOTE — Telephone Encounter (Signed)
 LMOM for patient to call the office to schedule a NPT F/U in 4-8 weeks  In case pt calls back. Dr Jeannetta would like to follow up in 4-8 wks to recheck for PMR

## 2023-12-08 ENCOUNTER — Other Ambulatory Visit

## 2023-12-08 DIAGNOSIS — Z006 Encounter for examination for normal comparison and control in clinical research program: Secondary | ICD-10-CM

## 2023-12-08 NOTE — Addendum Note (Signed)
 Addended by: TRECIA ALMARIE GRADE on: 12/08/2023 09:17 AM   Modules accepted: Orders

## 2023-12-15 ENCOUNTER — Ambulatory Visit (HOSPITAL_BASED_OUTPATIENT_CLINIC_OR_DEPARTMENT_OTHER)
Admission: RE | Admit: 2023-12-15 | Discharge: 2023-12-15 | Disposition: A | Discharge status: Home / Self Care | Payer: Self-pay | Source: Ambulatory Visit | Attending: Family Medicine | Admitting: Family Medicine

## 2023-12-15 DIAGNOSIS — E66811 Obesity, class 1: Secondary | ICD-10-CM | POA: Insufficient documentation

## 2023-12-15 DIAGNOSIS — E785 Hyperlipidemia, unspecified: Secondary | ICD-10-CM | POA: Insufficient documentation

## 2023-12-15 DIAGNOSIS — R7303 Prediabetes: Secondary | ICD-10-CM | POA: Insufficient documentation

## 2023-12-15 DIAGNOSIS — Z87891 Personal history of nicotine dependence: Secondary | ICD-10-CM | POA: Insufficient documentation

## 2023-12-17 ENCOUNTER — Telehealth (HOSPITAL_BASED_OUTPATIENT_CLINIC_OR_DEPARTMENT_OTHER): Payer: Self-pay | Admitting: Family Medicine

## 2023-12-17 NOTE — Telephone Encounter (Signed)
 Copied from CRM #[id number]. Topic: General - Other >> Dec 17, 2023 10:15 AM Travis F wrote: Reason for CRM: Roselie with [facility] is calling in checking on the status of an authorization that is needed prior to patient's appointment on Monday. She says she was told someone was handling it, but she hasn't heard anything back. Roselie says they are going to cancel the appointment and once the authorization is completed the office can reach out to her.  [phone] Please advise.

## 2023-12-19 LAB — GENECONNECT MOLECULAR SCREEN: Genetic Analysis Overall Interpretation: NEGATIVE

## 2023-12-22 ENCOUNTER — Other Ambulatory Visit

## 2023-12-25 ENCOUNTER — Other Ambulatory Visit (HOSPITAL_COMMUNITY): Payer: Self-pay

## 2023-12-25 MED ORDER — PREDNISONE 10 MG PO TABS
30.0000 mg | ORAL_TABLET | Freq: Every day | ORAL | 0 refills | Status: DC
  Filled 2023-12-25: qty 93, 31d supply, fill #0

## 2023-12-30 ENCOUNTER — Other Ambulatory Visit (HOSPITAL_COMMUNITY): Payer: Self-pay

## 2023-12-31 ENCOUNTER — Other Ambulatory Visit: Payer: Self-pay

## 2023-12-31 ENCOUNTER — Ambulatory Visit (HOSPITAL_BASED_OUTPATIENT_CLINIC_OR_DEPARTMENT_OTHER): Payer: Self-pay | Admitting: Family Medicine

## 2023-12-31 ENCOUNTER — Other Ambulatory Visit (HOSPITAL_COMMUNITY): Payer: Self-pay

## 2024-01-05 ENCOUNTER — Inpatient Hospital Stay: Admission: RE | Admit: 2024-01-05 | Discharge: 2024-01-05 | Attending: Family Medicine

## 2024-01-05 DIAGNOSIS — Z87891 Personal history of nicotine dependence: Secondary | ICD-10-CM

## 2024-01-06 ENCOUNTER — Encounter (INDEPENDENT_AMBULATORY_CARE_PROVIDER_SITE_OTHER): Payer: Self-pay | Admitting: Family Medicine

## 2024-01-06 ENCOUNTER — Ambulatory Visit (INDEPENDENT_AMBULATORY_CARE_PROVIDER_SITE_OTHER): Admitting: Family Medicine

## 2024-01-06 ENCOUNTER — Other Ambulatory Visit (HOSPITAL_COMMUNITY): Payer: Self-pay

## 2024-01-06 VITALS — BP 111/76 | HR 65 | Temp 98.3°F | Ht 67.0 in | Wt 208.0 lb

## 2024-01-06 DIAGNOSIS — Z6832 Body mass index (BMI) 32.0-32.9, adult: Secondary | ICD-10-CM

## 2024-01-06 DIAGNOSIS — R7303 Prediabetes: Secondary | ICD-10-CM | POA: Diagnosis not present

## 2024-01-06 DIAGNOSIS — E559 Vitamin D deficiency, unspecified: Secondary | ICD-10-CM | POA: Diagnosis not present

## 2024-01-06 DIAGNOSIS — Z6831 Body mass index (BMI) 31.0-31.9, adult: Secondary | ICD-10-CM

## 2024-01-06 DIAGNOSIS — E669 Obesity, unspecified: Secondary | ICD-10-CM | POA: Diagnosis not present

## 2024-01-06 DIAGNOSIS — M353 Polymyalgia rheumatica: Secondary | ICD-10-CM

## 2024-01-06 MED ORDER — WEGOVY 0.25 MG/0.5ML ~~LOC~~ SOAJ
0.2500 mg | SUBCUTANEOUS | 1 refills | Status: DC
  Filled 2024-01-06 – 2024-01-12 (×2): qty 2, 28d supply, fill #0

## 2024-01-06 MED ORDER — METFORMIN HCL ER 500 MG PO TB24: 500.0000 mg | ORAL_TABLET | Freq: Every day | ORAL | Status: AC

## 2024-01-06 MED ORDER — VITAMIN D (ERGOCALCIFEROL) 1.25 MG (50000 UNIT) PO CAPS
50000.0000 [IU] | ORAL_CAPSULE | ORAL | 0 refills | Status: AC
  Filled 2024-01-06 – 2024-01-30 (×2): qty 4, 28d supply, fill #0

## 2024-01-06 NOTE — Progress Notes (Signed)
 "  Connor Thomas, D.O.  ABFM, ABOM Specializing in Clinical Bariatric Medicine  Office located at: [address] [place], [place]  [address]      A) FOR THE CHRONIC DISEASE OF OBESITY:  Chief complaint: Obesity Connor Thomas is here to discuss his progress with his obesity treatment plan.   History of present illness / Interval history:  Connor Thomas is here today for his follow-up office visit.  Since last OV on 12/01/23, pt is up 1 lb. Patient states that he feels like there is a lot of food noise.     12/01/23 11:00 01/06/24 12:00   Body Fat % 28 % 27.4 %  Muscle Mass (lbs) 142.2 lbs 144 lbs  Fat Mass (lbs) 58.2 lbs 57.2 lbs  Total Body Water (lbs) 99.2 lbs 98.2 lbs  Visceral Fat Rating  16 16   Counseling done on how various foods will affect these numbers and how to maximize success.   Total lbs lost to date: - 2 lbs Total Fat Mass in lbs lost to date: - 0.2 lbs Total weight loss percentage to date: - 0.95 %    BMI 32.0-32.9,adult starting BMI 32.88 BMI 31.0-31.9,adult Current BMI 32.57  Nutrition Therapy He is journaling 1350-1450 calories and 90++ grams of protein per day and states he is following his eating plan approximately 25 % of the time.   - Tracking Calories/Macros: no   - Eating More Whole Foods: yes  - Adequate Protein Intake: yes  - Adequate Water Intake: no   - Skipping Meals: no  - Sleeping 7-9 Hours/ Night: yes   Serenity is currently in the action stage of change. As such, his goal is to continue weight management plan.  He has agreed to: Continue current plan     Physical Activity Volney is doing weights or stretching 30  minutes 5 days per week   [name] has been advised to work up to 300-450 minutes of moderate intensity aerobic activity a week and strengthening exercises 2-3 times per week for cardiovascular health, weight loss maintenance and preservation of muscle mass.  He has agreed to : Increase volume of physical activity to a  goal of 240 minutes a week and Combine aerobic and strengthening exercises for efficiency and improved cardiometabolic health.   Behavioral Modifications Evidence-based interventions for health behavior change were utilized today including the discussion of   1) SMART goals for next OV:    - Lose weight over the upcoming holidays   Regarding patient's less desirable eating habits and patterns, we employed the technique of small changes.   We discussed the following today: increasing lean protein intake to established goals, work on meal planning and preparation, reading food labels , keeping healthy foods at home, continue to practice mindfulness when eating, and planning for success Additional resources provided today: Handout on CAT 2 meal plan and Handout on Daily Food Journaling Log   Medical Interventions/ Pharmacotherapy Previous Bariatric surgery: n/a Pharmacotherapy for weight loss: He is currently taking Metformin  XR 1500 mg daily for medical weight loss.    Patient states that he feels hunger and food noise all the time. He wishes to be put on Wegovy  or on something to help. Explained to patient the risks and benefits of injectables and Wegovy . Patient denies having any contraindication like hx of medullary cancer. HE understand that he will still have to eat on plan and exercise. Will write for Wegovy  0.25 mg weekly.    We  discussed various medication options to help [name] with his weight loss efforts and we both agreed to : Continue with current nutritional and behavioral strategies   B) OBESITY RELATED CONDITIONS ADDRESSED TODAY:  Prediabetes Assessment & Plan Lab Results  Component Value Date   HGBA1C 5.9 (H) 11/03/2023   HGBA1C 5.6 05/14/2023   HGBA1C 5.8 (H) 12/31/2022   INSULIN  7.8 12/31/2022    On Metformin  XR 1500 mg. With reported good compliance and tolerance. Patient states that he has a lot of food noise and that the Metformin  is not working or being as  effective as before. Since we ar writing for Wegovy  today recommended that patient Decrease to 1 tablet daily  to continue to slow the progression from predm to DM. Patients A1c has increased from 5.6 to 5.9. Continue to follow meal plan and decrease simple carbs and sugars.     Vitamin D  insufficiency Assessment & Plan Lab Results  Component Value Date   VD25OH 81.3 11/03/2023   VD25OH 61.8 05/14/2023   VD25OH 39.7 12/31/2022   On ERGO 50k units once weekly. With reported good compliance and tolerance. Patient states that he has no issues remembering to take the supplementation. Will refill today. Continue to take the supplement. Will reassess as medically necessary.     PMR (polymyalgia rheumatica)- with repeated steroid use Assessment & Plan Patient states that recently he experiences some blindness in his left eye for about 30 to 45 seconds and he went to his eye doctor. He was placed back on Prednisone . He states that this has increased his hunger and he is increasingly hungry all the time. He reports that he will be on this steroids for months and does not see him being off the steroid in the near future. Will follow alongside PCP/ Specialist.       Medications Discontinued During This Encounter  Medication Reason   metFORMIN  (GLUCOPHAGE -XR) 500 MG 24 hr tablet    Vitamin D , Ergocalciferol , (DRISDOL ) 1.25 MG (50000 UNIT) CAPS capsule Reorder     Meds ordered this encounter  Medications   semaglutide -weight management (WEGOVY ) 0.25 MG/0.5ML SOAJ SQ injection    Sig: Inject 0.25 mg into the skin once a week.    Dispense:  2 mL    Refill:  1   metFORMIN  (GLUCOPHAGE -XR) 500 MG 24 hr tablet    Sig: Take 1 tablet (500 mg total) by mouth daily.   Vitamin D , Ergocalciferol , (DRISDOL ) 1.25 MG (50000 UNIT) CAPS capsule    Sig: Take 1 capsule (50,000 Units total) by mouth every 7 (seven) days.    Dispense:  4 capsule    Refill:  0     Follow up:   Return 02/04/2024 at 2:20 PM   He was informed of the importance of frequent follow up visits to maximize his success with intensive lifestyle modifications for his multiple health conditions.   Weight Summary and Biometrics   Weight Lost Since Last Visit: 0lb  Weight Gained Since Last Visit: 1lb   Vitals Temp: 98.3 F (36.8 C) BP: 111/76 Pulse Rate: 65 SpO2: 98 %   Anthropometric Measurements Height: 5' 7 (1.702 m) Weight: 208 lb (94.3 kg) BMI (Calculated): 32.57 Weight at Last Visit: 207lb Weight Lost Since Last Visit: 0lb Weight Gained Since Last Visit: 1lb Starting Weight: 210lb Total Weight Loss (lbs): 2 lb (0.907 kg)   Body Composition  Body Fat %: 27.4 % Fat Mass (lbs): 57.2 lbs Muscle Mass (lbs): 144 lbs Total Body Water (lbs):  98.2 lbs Visceral Fat Rating : 16   Other Clinical Data Fasting: Yes Labs: no Today's Visit #: 12 Starting Date: 12/31/22    Objective:   PHYSICAL EXAM: Blood pressure 111/76, pulse 65, temperature 98.3 F (36.8 C), height 5' 7 (1.702 m), weight 208 lb (94.3 kg), SpO2 98%. Body mass index is 32.58 kg/m.  General: he is overweight, cooperative and in no acute distress. PSYCH: Has normal mood, affect and thought process.   HEENT: EOMI, sclerae are anicteric. Lungs: Normal breathing effort, no conversational dyspnea. Extremities: Moves * 4 Neurologic: A and O * 3, good insight  DIAGNOSTIC DATA REVIEWED: BMET    Component Value Date/Time   NA 138 11/03/2023 0933   K 4.6 11/03/2023 0933   CL 99 11/03/2023 0933   CO2 23 11/03/2023 0933   GLUCOSE 98 11/03/2023 0933   GLUCOSE 106 (H) 07/08/2022 0838   GLUCOSE 93 01/27/2006 0917   BUN 12 11/03/2023 0933   CREATININE 0.78 11/03/2023 0933   CREATININE 0.91 12/14/2019 1010   CALCIUM 9.6 11/03/2023 0933   GFRNONAA 92 12/14/2019 1010   GFRAA 107 12/14/2019 1010   Lab Results  Component Value Date   HGBA1C 5.9 (H) 11/03/2023   HGBA1C 5.7 10/17/2014   Lab Results  Component Value Date   INSULIN   7.8 12/31/2022   Lab Results  Component Value Date   TSH 1.310 11/03/2023   CBC    Component Value Date/Time   WBC 8.7 11/03/2023 0933   WBC 14.5 (H) 06/18/2023 0851   RBC 4.73 11/03/2023 0933   RBC 4.33 06/18/2023 0851   HGB 13.4 11/03/2023 0933   HCT 40.4 11/03/2023 0933   PLT 357 11/03/2023 0933   MCV 85 11/03/2023 0933   MCH 28.3 11/03/2023 0933   MCH 30.7 12/14/2019 1010   MCHC 33.2 11/03/2023 0933   MCHC 33.8 06/18/2023 0851   RDW 13.2 11/03/2023 0933   Iron Studies No results found for: IRON, TIBC, FERRITIN, IRONPCTSAT Lipid Panel     Component Value Date/Time   CHOL 155 11/03/2023 0933   TRIG 67 11/03/2023 0933   TRIG 119 01/27/2006 0917   HDL 52 11/03/2023 0933   CHOLHDL 3.0 11/03/2023 0933   CHOLHDL 4 07/08/2022 0838   VLDL 18.4 07/08/2022 0838   LDLCALC 90 11/03/2023 0933   LDLCALC 103 (H) 12/14/2019 1010   LDLDIRECT 144.9 03/10/2008 0812   Hepatic Function Panel     Component Value Date/Time   PROT 6.8 11/03/2023 0933   ALBUMIN 4.5 11/03/2023 0933   AST 14 11/03/2023 0933   ALT 16 11/03/2023 0933   ALKPHOS 91 11/03/2023 0933   BILITOT 0.3 11/03/2023 0933   BILIDIR 0.1 07/08/2022 0838   IBILI 0.6 12/14/2019 1010      Component Value Date/Time   TSH 1.310 11/03/2023 0933   TSH 1.670 12/31/2022 0936   Nutritional Lab Results  Component Value Date   VD25OH 81.3 11/03/2023   VD25OH 61.8 05/14/2023   VD25OH 39.7 12/31/2022    Attestations:   LILLETTE Sonny Laroche, acting as a stage manager for Connor Jenkins, DO., have compiled all relevant documentation for today's office visit on behalf of Connor Jenkins, DO, while in the presence of [employer], DO.  I have reviewed the above documentation for accuracy and completeness, and I agree with the above. Connor JINNY Thomas, D.O.  The 21st Century Cures Act was signed into law in 2016 which includes the topic of electronic health records.  This provides immediate  access to information  in MyChart.  This includes consultation notes, operative notes, office notes, lab results and pathology reports.  If you have any questions about what you read please let us  know at your next visit so we can discuss your concerns and take corrective action if need be.  We are right here with you.  "

## 2024-01-07 ENCOUNTER — Telehealth (INDEPENDENT_AMBULATORY_CARE_PROVIDER_SITE_OTHER): Payer: Self-pay | Admitting: *Deleted

## 2024-01-07 NOTE — Telephone Encounter (Signed)
 Khole Zielke (Key: [id number])  [employer] has not yet replied to your PA request. Depending on the information you've provided, additional questions may be returned by the plan. You may close this dialog, return to your dashboard, and perform other tasks.  To check for an update later, open this request again from your dashboard.  If [employer] has not replied to your request within 24 hours please contact [employer] at [phone].

## 2024-01-12 ENCOUNTER — Other Ambulatory Visit (HOSPITAL_COMMUNITY): Payer: Self-pay

## 2024-01-12 ENCOUNTER — Encounter (INDEPENDENT_AMBULATORY_CARE_PROVIDER_SITE_OTHER): Payer: Self-pay | Admitting: Family Medicine

## 2024-01-12 NOTE — Telephone Encounter (Signed)
 PA for Wegovy  0.25 has been denied. PA is now complete.

## 2024-01-16 ENCOUNTER — Other Ambulatory Visit (HOSPITAL_COMMUNITY): Payer: Self-pay

## 2024-01-19 ENCOUNTER — Telehealth (HOSPITAL_COMMUNITY): Payer: Self-pay

## 2024-01-19 ENCOUNTER — Other Ambulatory Visit (HOSPITAL_COMMUNITY): Payer: Self-pay

## 2024-01-19 NOTE — Telephone Encounter (Signed)
 Pharmacy Patient Advocate Encounter  Received notification from [facility] that Prior Authorization for Wegovy  0.25MG /0.5ML auto-injectors  has been APPROVED from 01/19/24 to 08/16/24. Ran test claim, Copay is $24.99. This test claim was processed through [facility]- copay amounts may vary at other pharmacies due to pharmacy/plan contracts, or as the patient moves through the different stages of their insurance plan.   PA #/Case ID/Reference #: [id number]  *spoke with patient about copay card, he will bring in when he picks up

## 2024-01-19 NOTE — Telephone Encounter (Signed)
 Pharmacy Patient Advocate Encounter   Received notification from Patient Pharmacy that prior authorization for Wegovy  0.25MG /0.5ML auto-injectors  is required/requested.   Insurance verification completed.   The patient is insured through [facility].   Per test claim: PA required; PA submitted to above mentioned insurance via Latent Key/confirmation #/EOC [id number] Status is pending

## 2024-01-23 ENCOUNTER — Ambulatory Visit (HOSPITAL_BASED_OUTPATIENT_CLINIC_OR_DEPARTMENT_OTHER): Payer: Self-pay | Admitting: Family Medicine

## 2024-01-30 ENCOUNTER — Other Ambulatory Visit: Payer: Self-pay

## 2024-01-30 ENCOUNTER — Other Ambulatory Visit (HOSPITAL_COMMUNITY): Payer: Self-pay

## 2024-02-02 ENCOUNTER — Ambulatory Visit: Admitting: Internal Medicine

## 2024-02-02 NOTE — Progress Notes (Unsigned)
 "  Office Visit Note  Patient: Connor Thomas             Date of Birth: 1960-02-10           MRN: [id number]             PCP: de [place], Raymond J, MD Referring: de [place], Raymond J, MD Visit Date: 02/02/2024   Subjective:  No chief complaint on file.   History of Present Illness: Connor Thomas is a 64 y.o. male here for follow up ***   Previous HPI 11/25/23 Connor Thomas is a 64 year old male with suspected polymyalgia rheumatica who presents with increased joint pain and stiffness.   He has experienced increased pain and stiffness, particularly in the neck and shoulders, with grinding sensations. Intermittent low-grade fevers and elevated inflammatory markers were noted earlier this year. A flare-up in early September led to a tapering course of prednisone , which resolved the symptoms, but he did not have any follow-up after this treatment.   Recently, he experienced dull pain in the back of his left eye for about five days, which was more annoying than severe. An eye doctor prescribed a short course of prednisone  starting at 20 mg and tapering to 10 mg and then 5 mg, which alleviated the eye symptoms.   He has been on prednisone  multiple times since May, with significant improvement in symptoms each time. Initially, he managed his symptoms with Tylenol  and ibuprofen until starting prednisone  on May 16, which provided relief within 24 hours. He reports a history of fatigue associated with his symptoms, describing it as a significant issue. Engaging in light exercise and stretching has been beneficial, although it leads to increased fatigue requiring naps.   No new rashes, swelling, or side effects from prednisone . He has a history of back issues from an old injury, which he initially thought was related to his current symptoms. Imaging studies have shown arthritis but no need for surgical intervention.   No Rheumatology ROS completed.   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   COVID-19 virus infection 09/30/2018   S/P repair of recurrent ventral hernia April 2018 05/16/2016   Elevated blood sugar 10/17/2014   Hematuria of undiagnosed cause 10/17/2014   Hyperlipidemia 07/27/2012   Depression 07/27/2012   Allergic rhinitis 09/12/2006   GERD 09/12/2006    Past Medical History:  Diagnosis Date   Allergy    Seasonal   Anxiety    Mild   Arthritis    shoulders,ankles,lower back   Asthma    as a child   Back pain    Cancer (HCC)    melanoma   Depression    Mild   Edema    Ganglion of right wrist    GERD (gastroesophageal reflux disease)    Med   Hernia, inguinal    Hip pain    Hyperlipidemia    Melanoma in situ of nose (HCC) 11/2012   Rotator cuff tear    LEFT   Shoulder pain     Family History  Problem Relation Age of Onset   Hypertension Mother    Hyperlipidemia Mother    Depression Mother    Anxiety disorder  Mother    Obesity Mother    Pancreatic cancer Father    Hyperlipidemia Father    Heart disease Father    Cancer Father    Depression Father    Anxiety disorder Father    Sleep apnea Father    Obesity Father    Arthritis Father    Sleep apnea Sister    Colon cancer Neg Hx    Colon polyps Neg Hx    Crohn's disease Neg Hx    Esophageal cancer Neg Hx    Rectal cancer Neg Hx    Stomach cancer Neg Hx    Past Surgical History:  Procedure Laterality Date   ANKLE SURGERY Left 1992   COLONOSCOPY  06/25/2021   per Dr. Leigh, benign polyps, repeat in 7 to 10 yrs   ESOPHAGOGASTRODUODENOSCOPY  04/25/2006   HERNIA REPAIR  2015   Abdomen   left knee scope     POLYPECTOMY     TONSILLECTOMY AND ADENOIDECTOMY      UMBILICAL HERNIA REPAIR N/A 03/25/2012   Procedure: HERNIA REPAIR-- UMBILICAL-- ADULT;  Surgeon: Donnice KATHEE Lunger, MD;  Location: [facility];  Service: General;  Laterality: N/A;   UMBILICAL HERNIA REPAIR N/A 05/16/2016   Procedure: LAPAROSCOPIC ASSISTED REPAIR OF RECURRENT UMBILICAL HERNIA WITH MESH;  Surgeon: Donnice Lunger, MD;  Location: [facility];  Service: General;  Laterality: N/A;   Social History   Social History Narrative   Not on file   Immunization History  Administered Date(s) Administered   Influenza,inj,Quad PF,6+ Mos 09/17/2013, 10/17/2014, 10/23/2015, 10/03/2017, 11/23/2018, 12/14/2019   Moderna Sars-Covid-2 Vaccination 04/08/2019, 05/11/2019   Td 01/22/2000   Tdap 05/01/2010, 07/08/2022   Zoster Recombinant(Shingrix) 01/23/2021, 07/25/2021     Objective: Vital Signs: There were no vitals taken for this visit.   Physical Exam   Musculoskeletal Exam: ***  CDAI Exam: CDAI Score: -- Patient Global: --; Provider Global: -- Swollen: --; Tender: -- Joint Exam 02/02/2024   No joint exam has been documented for this visit   There is currently no information documented on the homunculus. Go to the Rheumatology activity and complete the homunculus joint exam.  Investigation: No additional findings.  Imaging: CT CHEST LUNG CA SCREEN LOW DOSE W/O CM Result Date: 01/09/2024 CLINICAL DATA:  64 year old male former smoker with 20 pack-year smoking history, quit smoking 6 years prior EXAM: CT CHEST WITHOUT CONTRAST LOW-DOSE FOR LUNG CANCER SCREENING TECHNIQUE: Multidetector CT imaging of the chest was performed following the standard protocol without IV contrast. RADIATION DOSE REDUCTION: This exam was performed according to the departmental dose-optimization program which includes automated exposure control, adjustment of the mA and/or kV according to patient size and/or use of iterative reconstruction technique. COMPARISON:  12/15/2023 coronary CT FINDINGS:  Cardiovascular: Normal heart size. Small pericardial effusion. Left anterior descending coronary atherosclerosis. Atherosclerotic nonaneurysmal thoracic aorta. Normal caliber pulmonary arteries. Mediastinum/Nodes: No significant thyroid  nodules. Unremarkable esophagus. No pathologically enlarged axillary, mediastinal or hilar lymph nodes, noting limited sensitivity for the detection of hilar adenopathy on this noncontrast study. Lungs/Pleura: No pneumothorax. No pleural effusion. Mild centrilobular emphysema with diffuse bronchial wall thickening. No acute consolidative airspace disease or lung masses. No significant pulmonary nodules. Upper abdomen: No acute abnormality. Musculoskeletal: No aggressive appearing focal osseous lesions. Moderate thoracic spondylosis. IMPRESSION: 1. Lung-RADS 1, negative. Continue annual screening with low-dose chest CT without contrast in 12 months. 2. One vessel coronary atherosclerosis. 3. Small pericardial effusion. 4. Aortic Atherosclerosis (ICD10-I70.0) and Emphysema (ICD10-J43.9). Electronically Signed   By: Selinda  A Poff M.D.   On: 01/09/2024 08:12    Recent Labs: Lab Results  Component Value Date   WBC 8.7 11/03/2023   HGB 13.4 11/03/2023   PLT 357 11/03/2023   NA 138 11/03/2023   K 4.6 11/03/2023   CL 99 11/03/2023   CO2 23 11/03/2023   GLUCOSE 98 11/03/2023   BUN 12 11/03/2023   CREATININE 0.78 11/03/2023   BILITOT 0.3 11/03/2023   ALKPHOS 91 11/03/2023   AST 14 11/03/2023   ALT 16 11/03/2023   PROT 6.8 11/03/2023   ALBUMIN 4.5 11/03/2023   CALCIUM 9.6 11/03/2023   GFRAA 107 12/14/2019    Speciality Comments: No specialty comments available.  Procedures:  No procedures performed Allergies: Patient has no known allergies.   Assessment / Plan:     Visit Diagnoses: No diagnosis found.  ***  Orders: No orders of the defined types were placed in this encounter.  No orders of the defined types were placed in this encounter.    Follow-Up  Instructions: No follow-ups on file.   Lonni LELON Ester, MD  Note - This record has been created using [employer].  Chart creation errors have been sought, but may not always  have been located. Such creation errors do not reflect on  the standard of medical care. "

## 2024-02-04 ENCOUNTER — Other Ambulatory Visit (HOSPITAL_COMMUNITY): Payer: Self-pay

## 2024-02-04 ENCOUNTER — Encounter (INDEPENDENT_AMBULATORY_CARE_PROVIDER_SITE_OTHER): Payer: Self-pay | Admitting: Family Medicine

## 2024-02-04 ENCOUNTER — Ambulatory Visit (INDEPENDENT_AMBULATORY_CARE_PROVIDER_SITE_OTHER): Admitting: Family Medicine

## 2024-02-04 VITALS — BP 93/59 | HR 63 | Temp 98.1°F | Ht 67.0 in | Wt 208.0 lb

## 2024-02-04 DIAGNOSIS — Z6832 Body mass index (BMI) 32.0-32.9, adult: Secondary | ICD-10-CM | POA: Diagnosis not present

## 2024-02-04 DIAGNOSIS — R7303 Prediabetes: Secondary | ICD-10-CM | POA: Diagnosis not present

## 2024-02-04 DIAGNOSIS — E669 Obesity, unspecified: Secondary | ICD-10-CM

## 2024-02-04 DIAGNOSIS — Z6831 Body mass index (BMI) 31.0-31.9, adult: Secondary | ICD-10-CM

## 2024-02-04 DIAGNOSIS — E559 Vitamin D deficiency, unspecified: Secondary | ICD-10-CM | POA: Diagnosis not present

## 2024-02-04 MED ORDER — WEGOVY 0.5 MG/0.5ML ~~LOC~~ SOAJ: 0.5000 mg | SUBCUTANEOUS | 0 refills | Status: DC

## 2024-02-04 MED ORDER — VITAMIN D (ERGOCALCIFEROL) 1.25 MG (50000 UNIT) PO CAPS
50000.0000 [IU] | ORAL_CAPSULE | ORAL | 1 refills | Status: AC
  Filled 2024-02-04 – 2024-02-18 (×2): qty 4, 28d supply, fill #0

## 2024-02-04 NOTE — Progress Notes (Signed)
 "  Connor Thomas, D.O.  ABFM, ABOM Specializing in Clinical Bariatric Medicine  Office located at: [address] [place], [place]  [address]      Medications Discontinued During This Encounter  Medication Reason   semaglutide -weight management (WEGOVY ) 0.25 MG/0.5ML SOAJ SQ injection Dose change   Vitamin D , Ergocalciferol , (DRISDOL ) 1.25 MG (50000 UNIT) CAPS capsule Reorder     Meds ordered this encounter  Medications   Vitamin D , Ergocalciferol , (DRISDOL ) 1.25 MG (50000 UNIT) CAPS capsule    Sig: Take 1 capsule (50,000 Units total) by mouth every 7 (seven) days.    Dispense:  4 capsule    Refill:  1   semaglutide -weight management (WEGOVY ) 0.5 MG/0.5ML SOAJ SQ injection    Sig: Inject 0.5 mg into the skin once a week.    Dispense:  2 mL    Refill:  0      A) FOR THE CHRONIC DISEASE OF OBESITY:  BMI 32.0-32.9,adult starting BMI 32.88 BMI 31.0-31.9,adult Current BMI 32.57   Chief complaint: Obesity Connor Thomas is here to discuss Connor Thomas progress with Connor Thomas obesity treatment plan.   History of present illness / Interval history:  Connor Thomas is here today for Connor Thomas follow-up office visit.  Since last OV on 01/06/2024, pt weight is unchanged..    01/06/24 12:00 02/04/24 14:00   Body Fat % 27.4 % 28.3 %  Muscle Mass (lbs) 144 lbs 142.4 lbs  Fat Mass (lbs) 57.2 lbs 59 lbs  Total Body Water (lbs) 98.2 lbs 98.8 lbs  Visceral Fat Rating  16 17  Counseling done on how various foods will affect these numbers and how to maximize success   Total lbs lost to date: -2 lbs Total Fat Mass in lbs lost to date: +0.6 Total weight loss percentage to date: -0.95 %   Nutrition Therapy Connor Thomas is  journaling 1350-1450 calories and 90++ grams of protein per day  and states Connor Thomas is following Connor Thomas eating plan approximately 20 % of the time.   - Tracking Calories/Macros: no  - Eating More Whole Foods: yes  - Adequate Protein Intake: yes  - Adequate Water Intake: no  - Skipping Meals: no  -  Sleeping 7-9 Hours/ Night: yes   Connor Thomas is currently in the action stage of change. As such, Connor Thomas goal is to continue weight management plan.  Connor Thomas has agreed to: continue current plan   Physical Activity Pt is stretching or doing yoga 60  minutes 2 days per week   Connor Thomas has been advised to work up to 300-450 minutes of moderate intensity aerobic activity a week and strengthening exercises 2-3 times per week for cardiovascular health, weight loss maintenance and preservation of muscle mass.  Connor Thomas has agreed to : Continue current level of physical activity , Continue to gradually increase the amount and intensity of exercise routine, Increase volume of physical activity to a goal of 240 minutes a week, and Combine aerobic and strengthening exercises for efficiency and improved cardiometabolic health.   Behavioral Modifications Evidence-based interventions for health behavior change were utilized today including the discussion of  1) self monitoring techniques:  weigh and measure proteins, journaling  2) self care:  exercise 3) SMART goals for next OV:  Journal 2 days during the weekday and one day during on the weekend. Regarding patient's less desirable eating habits and patterns, we employed the technique of small changes.   We discussed the following today: increasing lean protein intake to established goals, work on  tracking and journaling calories using tracking application, practice mindfulness eating and understand the difference between hunger signals and cravings, continue to work on implementation of reduced calorie nutritional plan, and continue to practice mindfulness when eating Additional resources provided today: None   Medical Interventions/ Pharmacotherapy Previous Bariatric surgery: n/a Pharmacotherapy for weight loss: Connor Thomas is currently taking Metformin  XR 500 mg daily and Wegovy  0.25 mg weekly for medical weight loss.    Currently on Wegovy  0.25 mg once weekly with good  compliance and tolerance. Pt reports Connor Thomas is doing well on the medicine and feels as it is helping him. Reports the medication has helped quiet the food noise. Denies side effects. Because Connor Thomas is tolerating medication well, we will increase dose to [facility] 0.5 mg once weekly to help promote weight loss and continue to manage hunger and cravings.  Pt is aware of the side effects of increased dose. Encouraged to adhere to MP and focus on whole foods and lean proteins to help subside side effects.   We discussed various medication options to help Connor Thomas with Connor Thomas weight loss efforts and we both agreed to : Continue with current nutritional and behavioral strategies and Increase Wegovy  to 0.5 mg once weekly.   B) OBESITY RELATED CONDITIONS ADDRESSED TODAY:   Prediabetes Assessment & Plan Lab Results  Component Value Date   HGBA1C 5.9 (H) 11/03/2023   HGBA1C 5.6 05/14/2023   HGBA1C 5.8 (H) 12/31/2022   INSULIN  7.8 12/31/2022  On Metformin  XR 500 mg once daily with good compliance and tolerance. Hunger and cravings are well controlled. Reports Connor Thomas food noise has quieted down. No acute concerns today. Discussed the option of discontinuing metformin , pt declines for now. Cont decreasing simple carbs and increasing lean proteins. Increase exercise as able.    Vitamin D  insufficiency Assessment & Plan Lab Results  Component Value Date   VD25OH 81.3 11/03/2023   VD25OH 61.8 05/14/2023   VD25OH 39.7 12/31/2022  Currently on Ergo 50 K units once weekly with good compliance and tolerance. Vit D levels are at goal. Cont regimen. Will recheck as deemed clinically necessary.     Medications Discontinued During This Encounter  Medication Reason   semaglutide -weight management (WEGOVY ) 0.25 MG/0.5ML SOAJ SQ injection Dose change   Vitamin D , Ergocalciferol , (DRISDOL ) 1.25 MG (50000 UNIT) CAPS capsule Reorder     Meds ordered this encounter  Medications   Vitamin D , Ergocalciferol , (DRISDOL ) 1.25 MG  (50000 UNIT) CAPS capsule    Sig: Take 1 capsule (50,000 Units total) by mouth every 7 (seven) days.    Dispense:  4 capsule    Refill:  1   semaglutide -weight management (WEGOVY ) 0.5 MG/0.5ML SOAJ SQ injection    Sig: Inject 0.5 mg into the skin once a week.    Dispense:  2 mL    Refill:  0      Follow up:   Return 03/01/2024 12:20 PM.  Connor Thomas was informed of the importance of frequent follow up visits to maximize Connor Thomas success with intensive lifestyle modifications for Connor Thomas multiple health conditions.   Weight Summary and Biometrics   Weight Lost Since Last Visit: 0lb  Weight Gained Since Last Visit: 0lb    Vitals Temp: 98.1 F (36.7 C) BP: (!) 93/59 Pulse Rate: 63 SpO2: 93 %   Anthropometric Measurements Height: 5' 7 (1.702 m) Weight: 208 lb (94.3 kg) BMI (Calculated): 32.57 Weight at Last Visit: 208lb Weight Lost Since Last Visit: 0lb Weight Gained Since Last Visit: 0lb Starting Weight: 210lb  Total Weight Loss (lbs): 2 lb (0.907 kg)   Body Composition  Body Fat %: 28.3 % Fat Mass (lbs): 59 lbs Muscle Mass (lbs): 142.4 lbs Total Body Water (lbs): 98.8 lbs Visceral Fat Rating : 17   Other Clinical Data Fasting: no Labs: no Today's Visit #: 13 Starting Date: 12/31/22    Objective:   PHYSICAL EXAM: Blood pressure (!) 93/59, pulse 63, temperature 98.1 F (36.7 C), height 5' 7 (1.702 m), weight 208 lb (94.3 kg), SpO2 93%. Body mass index is 32.58 kg/m.  General: Connor Thomas is overweight, cooperative and in no acute distress. PSYCH: Has normal mood, affect and thought process.   HEENT: EOMI, sclerae are anicteric. Lungs: Normal breathing effort, no conversational dyspnea. Extremities: Moves * 4 Neurologic: A and O * 3, good insight  DIAGNOSTIC DATA REVIEWED: BMET    Component Value Date/Time   NA 138 11/03/2023 0933   K 4.6 11/03/2023 0933   CL 99 11/03/2023 0933   CO2 23 11/03/2023 0933   GLUCOSE 98 11/03/2023 0933   GLUCOSE 106 (H) 07/08/2022 0838    GLUCOSE 93 01/27/2006 0917   BUN 12 11/03/2023 0933   CREATININE 0.78 11/03/2023 0933   CREATININE 0.91 12/14/2019 1010   CALCIUM 9.6 11/03/2023 0933   GFRNONAA 92 12/14/2019 1010   GFRAA 107 12/14/2019 1010   Lab Results  Component Value Date   HGBA1C 5.9 (H) 11/03/2023   HGBA1C 5.7 10/17/2014   Lab Results  Component Value Date   INSULIN  7.8 12/31/2022   Lab Results  Component Value Date   TSH 1.310 11/03/2023   CBC    Component Value Date/Time   WBC 8.7 11/03/2023 0933   WBC 14.5 (H) 06/18/2023 0851   RBC 4.73 11/03/2023 0933   RBC 4.33 06/18/2023 0851   HGB 13.4 11/03/2023 0933   HCT 40.4 11/03/2023 0933   PLT 357 11/03/2023 0933   MCV 85 11/03/2023 0933   MCH 28.3 11/03/2023 0933   MCH 30.7 12/14/2019 1010   MCHC 33.2 11/03/2023 0933   MCHC 33.8 06/18/2023 0851   RDW 13.2 11/03/2023 0933   Iron Studies No results found for: IRON, TIBC, FERRITIN, IRONPCTSAT Lipid Panel     Component Value Date/Time   CHOL 155 11/03/2023 0933   TRIG 67 11/03/2023 0933   TRIG 119 01/27/2006 0917   HDL 52 11/03/2023 0933   CHOLHDL 3.0 11/03/2023 0933   CHOLHDL 4 07/08/2022 0838   VLDL 18.4 07/08/2022 0838   LDLCALC 90 11/03/2023 0933   LDLCALC 103 (H) 12/14/2019 1010   LDLDIRECT 144.9 03/10/2008 0812   Hepatic Function Panel     Component Value Date/Time   PROT 6.8 11/03/2023 0933   ALBUMIN 4.5 11/03/2023 0933   AST 14 11/03/2023 0933   ALT 16 11/03/2023 0933   ALKPHOS 91 11/03/2023 0933   BILITOT 0.3 11/03/2023 0933   BILIDIR 0.1 07/08/2022 0838   IBILI 0.6 12/14/2019 1010      Component Value Date/Time   TSH 1.310 11/03/2023 0933   TSH 1.670 12/31/2022 0936   Nutritional Lab Results  Component Value Date   VD25OH 81.3 11/03/2023   VD25OH 61.8 05/14/2023   VD25OH 39.7 12/31/2022    Attestations:   I, Connor Thomas, acting as a stage manager for Connor Jenkins, Connor Thomas., have compiled all relevant documentation for today's office visit on behalf  of Connor Jenkins, Connor Thomas, while in the presence of [employer], Connor Thomas.   I have reviewed the above documentation for accuracy and  completeness, and I agree with the above. Connor Thomas, D.O.  The 21st Century Cures Act was signed into law in 2016 which includes the topic of electronic health records.  This provides immediate access to information in MyChart.  This includes consultation notes, operative notes, office notes, lab results and pathology reports.  If you have any questions about what you read please let us  know at your next visit so we can discuss your concerns and take corrective action if need be.  We are right here with you.  "

## 2024-02-04 NOTE — Progress Notes (Signed)
 "  Office Visit Note  Patient: Connor Thomas             Date of Birth: Apr 15, 1960           MRN: [id number]             Visit Date: 02/05/2024  PCP: de [place], Raymond J, MD   Subjective:   Chief Complaint: Medication Management (Discuss medication )  History of Present Illness: Connor Thomas is a 64 y.o. 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 He is feeling okay today. He is currently taking prednisone  20 mg. He endorses tolerating medications without any side effects other than increased appetite. Currently, he is being treated for suspected giant cell arteritis. He is currently on prednisone  20 mg and has been for over a month. He is also being followed by ophthalmology. He previously tried to taper down the prednisone  in November, but headaches returned. Since being on this last course of prednisone  20 mg, he denies any visual symptoms, headaches, jaw pain or claudication, or difficulty chewing. He is not experiencing pain in the back, hands, wrists, elbows, shoulders, hips, knees, ankles, and feet. He has occasional morning stiffness.  He started seeing bariatrics and is currently on Wygovy for weight loss. Otherwise, patient has not had any other significant health changes since last visit.   Previous Medication Trials: prednisone   Last Labs: 10/13 ESR 16, CRP 5  Review of Systems: Review of Systems  Constitutional:  Positive for fatigue.  HENT:  Positive for mouth sores and mouth dryness.   Eyes:  Positive for dryness.  Respiratory:  Negative for shortness of breath.   Cardiovascular:  Negative for chest pain and palpitations.  Gastrointestinal:  Negative for blood in stool, constipation and diarrhea.  Endocrine: Positive for increased urination.  Genitourinary:  Negative for involuntary urination.  Musculoskeletal:  Positive for joint pain, joint pain, myalgias and myalgias. Negative for gait problem, joint swelling, muscle weakness,  morning stiffness and muscle tenderness.  Skin:  Positive for sensitivity to sunlight. Negative for color change, rash and hair loss.  Allergic/Immunologic: Negative for susceptible to infections.  Neurological:  Negative for dizziness and headaches.  Hematological:  Negative for swollen glands.  Psychiatric/Behavioral:  Positive for depressed mood and sleep disturbance. The patient is nervous/anxious.     Medication List: Current Medications[1]   Allergies:  Patient has no known allergies.  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   S/P repair of recurrent ventral hernia April 2018 05/16/2016   Hematuria of undiagnosed cause 10/17/2014   Hyperlipidemia 07/27/2012   Depression 07/27/2012   Allergic rhinitis 09/12/2006   GERD 09/12/2006    History: Past Medical History:  Diagnosis Date   Allergy    Seasonal   Anxiety    Mild   Arthritis    shoulders,ankles,lower back   Asthma    as a child   Back pain    Cancer (HCC)    melanoma   Depression  Mild   Edema    Ganglion of right wrist    GERD (gastroesophageal reflux disease)    Med   Hernia, inguinal    Hip pain    Hyperlipidemia    Melanoma in situ of nose (HCC) 11/2012   Rotator cuff tear    LEFT   Shoulder pain     Family History  Problem Relation Age of Onset   Hypertension Mother    Hyperlipidemia Mother    Depression  Mother    Anxiety disorder Mother    Obesity Mother    Pancreatic cancer Father    Hyperlipidemia Father    Heart disease Father    Cancer Father    Depression Father    Anxiety disorder Father    Sleep apnea Father    Obesity Father    Arthritis Father    Sleep apnea Sister    Colon cancer Neg Hx    Colon polyps Neg Hx    Crohn's disease Neg Hx    Esophageal cancer Neg Hx    Rectal cancer Neg Hx    Stomach cancer Neg Hx    Past Surgical History:  Procedure Laterality Date   ANKLE SURGERY Left 1992   COLONOSCOPY  06/25/2021   per Dr. Leigh, benign polyps, repeat in 7 to 10 yrs   ESOPHAGOGASTRODUODENOSCOPY  04/25/2006   HERNIA REPAIR  2015   Abdomen   left knee scope     POLYPECTOMY     TONSILLECTOMY AND ADENOIDECTOMY     UMBILICAL HERNIA REPAIR N/A 03/25/2012   Procedure: HERNIA REPAIR-- UMBILICAL-- ADULT;  Surgeon: Donnice KATHEE Lunger, MD;  Location: [facility];  Service: General;  Laterality: N/A;   UMBILICAL HERNIA REPAIR N/A 05/16/2016   Procedure: LAPAROSCOPIC ASSISTED REPAIR OF RECURRENT UMBILICAL HERNIA WITH MESH;  Surgeon: Donnice Lunger, MD;  Location: [facility];  Service: General;  Laterality: N/A;   Social History   Social History Narrative   Not on file   Objective: Vital Signs: BP 117/68   Pulse 66   Temp 98.4 F (36.9 C)   Resp 16   Ht 5' 7 (1.702 m)   Wt 214 lb 9.6 oz (97.3 kg)   BMI 33.61 kg/m   Physical Exam HENT:     Head: Normocephalic and atraumatic.     Jaw: There is normal jaw occlusion. No trismus, tenderness or pain on movement.     Comments: Normal pulses in bilateral temples. Eyes:     General: Lids are normal. Vision grossly intact. No scleral icterus.    Extraocular Movements: Extraocular movements intact.     Conjunctiva/sclera: Conjunctivae normal.     Pupils: Pupils are equal, round, and reactive to light.  Cardiovascular:     Rate and Rhythm: Normal rate and regular rhythm.     Heart sounds: Normal heart  sounds.  Pulmonary:     Effort: Pulmonary effort is normal.     Breath sounds: Normal breath sounds.  Musculoskeletal:     Right lower leg: No edema.     Left lower leg: No edema.     Comments: No tenderness or swelling noted in bilateral shoulders, neck, hips, SI joints, elbows, wrists, MCPs, knees, and ankles. Shoulders have full range of motion without pain. Hips have full range of motions.   Skin:    General: Skin is warm.  Neurological:     Mental Status: He is alert.  Psychiatric:        Mood and Affect:  Mood normal.        Behavior: Behavior normal.     Imaging: No results found. Assessment & Plan Giant cell arteritis (HCC) We will taper his dose of prednisone  20 mg down by 2.5 mg x 2 weeks 10 mg daily. Then, we will decrease by 1 mg every 2 weeks for a total of ~6 months. Discussed risks and benefits of prednisone  alone vs prednisone  in combination with Actemra. Short and long term risks and side effects of prednisone  discussed.Counseled patient to see ophthalmologist immediately if any visual symptoms occur while decreasing prednisone .  -Start tapering prednisone  as discussed.  -Continue monitoring for symptoms of giant cell arteritis including visual changes, jaw pain, and headache.  PMR (polymyalgia rheumatica) Differential still includes rheumatoid arthritis and other inflammatory conditions. Will wait to recheck other rheumatologic labs until he has been off prednisone  for 1 month entirely.   I personally spent a total of 65 minutes in the care of the patient today including preparing to see the patient, getting/reviewing separately obtained history, performing a medically appropriate exam/evaluation, counseling and educating, placing orders, documenting clinical information in the EHR, and coordinating care.   Follow-Up Instructions:  Return in about 3 months (around 05/05/2024) for PMR/GCA.   Procedures: No procedures performed  Daved GORMAN Holstein, PA-C  Note - This  record has been created using [employer]. Chart creation errors have been sought, but may not always have been located. Such creation errors do not reflect on the standard of medical care.        [1]  Current Outpatient Medications:    Acetaminophen  (TYLENOL  PO), Take by mouth as needed., Disp: , Rfl:    acyclovir  (ZOVIRAX ) 200 MG capsule, TAKE 1 CAPSULE BY MOUTH FIVE TIMES DAILY, Disp: 100 capsule, Rfl: 3   albuterol  (PROVENTIL  HFA) 108 (90 Base) MCG/ACT inhaler, Inhale 2 puffs into the lungs every 4 (four) hours as needed for wheezing or shortness of breath., Disp: 6.7 g, Rfl: 5   citalopram  (CELEXA ) 20 MG tablet, Take 1.5 tablets (30 mg total) by mouth daily., Disp: 135 tablet, Rfl: 1   cyanocobalamin  (VITAMIN B12) 500 MCG tablet, Take 1 tablet (500 mcg total) by mouth daily., Disp: , Rfl:    fluticasone  (FLONASE ) 50 MCG/ACT nasal spray, Instill 1 spray into each nostril following sinus rinses twice daily, Disp: 16 g, Rfl: 2   metFORMIN  (GLUCOPHAGE -XR) 500 MG 24 hr tablet, Take 1 tablet (500 mg total) by mouth daily., Disp: , Rfl:    montelukast  (SINGULAIR ) 10 MG tablet, Take 1 tablet (10 mg total) by mouth at bedtime., Disp: 90 tablet, Rfl: 0   omeprazole  (PRILOSEC) 20 MG capsule, Take 1 capsule (20 mg total) by mouth daily., Disp: 90 capsule, Rfl: 3   predniSONE  (DELTASONE ) 1 MG tablet, Take 1 tablet (1 mg total) by mouth as directed. Follow taper schedule as provided to patient., Disp: 196 tablet, Rfl: 0   predniSONE  (DELTASONE ) 5 MG tablet, Take 1 tablet (5 mg total) by mouth as directed. Follow taper schedule as provided to patient., Disp: 364 tablet, Rfl: 0   semaglutide -weight management (WEGOVY ) 0.5 MG/0.5ML SOAJ SQ injection, Inject 0.5 mg into the skin once a week., Disp: 2 mL, Rfl: 0   simvastatin  (ZOCOR ) 40 MG tablet, Take 1 tablet (40 mg total) by mouth daily., Disp: 90 tablet, Rfl: 3   Vitamin D , Ergocalciferol , (DRISDOL ) 1.25 MG (50000 UNIT) CAPS capsule, Take 1 capsule  (50,000 Units total) by mouth every 7 (seven) days., Disp: 4  capsule, Rfl: 1   Ibuprofen (ADVIL PO), Take by mouth as needed. (Patient not taking: Reported on 02/05/2024), Disp: , Rfl:    levocetirizine (XYZAL ) 5 MG tablet, Take 1 tablet (5 mg total) by mouth every evening. (Patient not taking: Reported on 02/05/2024), Disp: 90 tablet, Rfl: 0   meloxicam  (MOBIC ) 7.5 MG tablet, Take 1 tablet (7.5 mg total) by mouth 2 (two) times daily for 21 days (Patient not taking: Reported on 02/05/2024), Disp: 42 tablet, Rfl: 1  "

## 2024-02-04 NOTE — Assessment & Plan Note (Addendum)
 Differential still includes rheumatoid arthritis and other inflammatory conditions. Will wait to recheck other rheumatologic labs until he has been off prednisone  for 1 month entirely.

## 2024-02-04 NOTE — Assessment & Plan Note (Deleted)
 SABRA

## 2024-02-05 ENCOUNTER — Other Ambulatory Visit (HOSPITAL_COMMUNITY): Payer: Self-pay

## 2024-02-05 ENCOUNTER — Other Ambulatory Visit (INDEPENDENT_AMBULATORY_CARE_PROVIDER_SITE_OTHER): Payer: Self-pay | Admitting: Family Medicine

## 2024-02-05 ENCOUNTER — Ambulatory Visit

## 2024-02-05 VITALS — BP 117/68 | HR 66 | Temp 98.4°F | Resp 16 | Ht 67.0 in | Wt 214.6 lb

## 2024-02-05 DIAGNOSIS — M353 Polymyalgia rheumatica: Secondary | ICD-10-CM

## 2024-02-05 DIAGNOSIS — M316 Other giant cell arteritis: Secondary | ICD-10-CM | POA: Diagnosis not present

## 2024-02-05 DIAGNOSIS — R7303 Prediabetes: Secondary | ICD-10-CM

## 2024-02-05 MED ORDER — PREDNISONE 1 MG PO TABS
1.0000 mg | ORAL_TABLET | ORAL | 0 refills | Status: DC
  Filled 2024-02-05: qty 93, 31d supply, fill #0

## 2024-02-05 MED ORDER — PREDNISONE 5 MG PO TABS
5.0000 mg | ORAL_TABLET | ORAL | 0 refills | Status: AC
  Filled 2024-02-05: qty 49, 28d supply, fill #0
  Filled 2024-02-18: qty 100, 30d supply, fill #0

## 2024-02-05 NOTE — Patient Instructions (Signed)
 We will taper your dose of prednisone  down by 2.5 mg every 2 weeks until you are at 10 mg daily. Then, we will decrease by 1 mg every 2 weeks. This will be a total of 6 months.

## 2024-02-06 ENCOUNTER — Other Ambulatory Visit (INDEPENDENT_AMBULATORY_CARE_PROVIDER_SITE_OTHER): Payer: Self-pay | Admitting: Family Medicine

## 2024-02-06 DIAGNOSIS — R7303 Prediabetes: Secondary | ICD-10-CM

## 2024-02-09 ENCOUNTER — Other Ambulatory Visit (HOSPITAL_COMMUNITY): Payer: Self-pay

## 2024-02-10 ENCOUNTER — Encounter: Payer: Self-pay | Admitting: Internal Medicine

## 2024-02-10 ENCOUNTER — Encounter (INDEPENDENT_AMBULATORY_CARE_PROVIDER_SITE_OTHER): Payer: Self-pay | Admitting: Family Medicine

## 2024-02-10 ENCOUNTER — Other Ambulatory Visit (INDEPENDENT_AMBULATORY_CARE_PROVIDER_SITE_OTHER): Payer: Self-pay | Admitting: Family Medicine

## 2024-02-10 DIAGNOSIS — R7303 Prediabetes: Secondary | ICD-10-CM

## 2024-02-11 ENCOUNTER — Telehealth (INDEPENDENT_AMBULATORY_CARE_PROVIDER_SITE_OTHER): Payer: Self-pay | Admitting: Family Medicine

## 2024-02-11 ENCOUNTER — Other Ambulatory Visit (INDEPENDENT_AMBULATORY_CARE_PROVIDER_SITE_OTHER): Payer: Self-pay

## 2024-02-11 ENCOUNTER — Other Ambulatory Visit (HOSPITAL_COMMUNITY): Payer: Self-pay

## 2024-02-11 ENCOUNTER — Encounter (HOSPITAL_COMMUNITY): Payer: Self-pay

## 2024-02-11 ENCOUNTER — Encounter: Payer: Self-pay | Admitting: Internal Medicine

## 2024-02-11 DIAGNOSIS — E66811 Obesity, class 1: Secondary | ICD-10-CM

## 2024-02-11 DIAGNOSIS — Z6832 Body mass index (BMI) 32.0-32.9, adult: Secondary | ICD-10-CM

## 2024-02-11 MED ORDER — WEGOVY 0.5 MG/0.5ML ~~LOC~~ SOAJ: 0.5000 mg | SUBCUTANEOUS | 0 refills | Status: AC

## 2024-02-11 MED ORDER — WEGOVY 0.5 MG/0.5ML ~~LOC~~ SOAJ
0.5000 mg | SUBCUTANEOUS | 0 refills | Status: DC
  Filled 2024-02-11: qty 2, 28d supply, fill #0

## 2024-02-11 NOTE — Telephone Encounter (Signed)
 Please advise.

## 2024-02-11 NOTE — Telephone Encounter (Signed)
 Pts wife called and they got a message on my chart from a joanne and they were saying the wegovy  was denied but did not leave a number or anyway to contact.  She asked if there was a new rx for wegovy  sent to [facility] and I checked and there was one sent. She/they are going to call [facility].

## 2024-02-18 ENCOUNTER — Other Ambulatory Visit (HOSPITAL_COMMUNITY): Payer: Self-pay

## 2024-02-19 ENCOUNTER — Other Ambulatory Visit: Payer: Self-pay

## 2024-03-01 ENCOUNTER — Ambulatory Visit (INDEPENDENT_AMBULATORY_CARE_PROVIDER_SITE_OTHER): Admitting: Family Medicine

## 2024-03-29 ENCOUNTER — Ambulatory Visit (INDEPENDENT_AMBULATORY_CARE_PROVIDER_SITE_OTHER): Admitting: Family Medicine

## 2024-05-03 ENCOUNTER — Ambulatory Visit: Admitting: Internal Medicine

## 2024-11-29 ENCOUNTER — Encounter (HOSPITAL_BASED_OUTPATIENT_CLINIC_OR_DEPARTMENT_OTHER): Admitting: Family Medicine
